# Patient Record
Sex: Female | Born: 1945
Health system: Southern US, Community
[De-identification: ages and names within clinical notes are randomized; demographics above are authoritative.]

## PROBLEM LIST (undated history)

## (undated) DIAGNOSIS — I10 Essential (primary) hypertension: Secondary | ICD-10-CM

## (undated) DIAGNOSIS — Z86010 Personal history of colonic polyps: Secondary | ICD-10-CM

## (undated) DIAGNOSIS — Z85828 Personal history of other malignant neoplasm of skin: Secondary | ICD-10-CM

## (undated) DIAGNOSIS — N6009 Solitary cyst of unspecified breast: Secondary | ICD-10-CM

## (undated) DIAGNOSIS — G4700 Insomnia, unspecified: Secondary | ICD-10-CM

## (undated) DIAGNOSIS — M1990 Unspecified osteoarthritis, unspecified site: Secondary | ICD-10-CM

## (undated) DIAGNOSIS — M25569 Pain in unspecified knee: Secondary | ICD-10-CM

## (undated) DIAGNOSIS — K649 Unspecified hemorrhoids: Secondary | ICD-10-CM

## (undated) DIAGNOSIS — M76899 Other specified enthesopathies of unspecified lower limb, excluding foot: Secondary | ICD-10-CM

## (undated) DIAGNOSIS — K219 Gastro-esophageal reflux disease without esophagitis: Secondary | ICD-10-CM

## (undated) DIAGNOSIS — E039 Hypothyroidism, unspecified: Secondary | ICD-10-CM

## (undated) HISTORY — DX: Personal history of other malignant neoplasm of skin: Z85.828

## (undated) HISTORY — DX: Solitary cyst of unspecified breast: N60.09

## (undated) HISTORY — PX: BASAL CELL CARCINOMA EXCISION: SHX1214

## (undated) HISTORY — DX: Essential (primary) hypertension: I10

## (undated) HISTORY — PX: HYSTEROSCOPY: SHX211

## (undated) HISTORY — DX: Personal history of colonic polyps: Z86.010

## (undated) HISTORY — DX: Unspecified hemorrhoids: K64.9

## (undated) HISTORY — PX: POLYPECTOMY: SHX149

## (undated) HISTORY — PX: COLONOSCOPY: SHX174

## (undated) HISTORY — DX: Other specified enthesopathies of unspecified lower limb, excluding foot: M76.899

## (undated) HISTORY — DX: Insomnia, unspecified: G47.00

## (undated) HISTORY — PX: TONSILLECTOMY AND ADENOIDECTOMY: SUR1326

## (undated) HISTORY — DX: Pain in unspecified knee: M25.569

---
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
## 1988-09-19 HISTORY — PX: OTHER SURGICAL HISTORY: SHX169

## 1998-02-02 ENCOUNTER — Other Ambulatory Visit: Admission: RE | Admit: 1998-02-02 | Discharge: 1998-02-02 | Payer: Self-pay | Admitting: Gynecology

## 1999-05-11 ENCOUNTER — Other Ambulatory Visit: Admission: RE | Admit: 1999-05-11 | Discharge: 1999-05-11 | Payer: Self-pay | Admitting: Gynecology

## 2000-02-02 ENCOUNTER — Other Ambulatory Visit: Admission: RE | Admit: 2000-02-02 | Discharge: 2000-02-02 | Payer: Self-pay | Admitting: Gynecology

## 2000-09-14 ENCOUNTER — Other Ambulatory Visit: Admission: RE | Admit: 2000-09-14 | Discharge: 2000-09-14 | Payer: Self-pay | Admitting: Gynecology

## 2000-09-15 ENCOUNTER — Other Ambulatory Visit: Admission: RE | Admit: 2000-09-15 | Discharge: 2000-09-15 | Payer: Self-pay | Admitting: Gynecology

## 2000-09-15 ENCOUNTER — Encounter (INDEPENDENT_AMBULATORY_CARE_PROVIDER_SITE_OTHER): Payer: Self-pay

## 2000-10-13 ENCOUNTER — Other Ambulatory Visit: Admission: RE | Admit: 2000-10-13 | Discharge: 2000-10-13 | Payer: Self-pay | Admitting: Surgery

## 2001-09-18 ENCOUNTER — Other Ambulatory Visit: Admission: RE | Admit: 2001-09-18 | Discharge: 2001-09-18 | Payer: Self-pay | Admitting: Gynecology

## 2002-11-18 ENCOUNTER — Other Ambulatory Visit: Admission: RE | Admit: 2002-11-18 | Discharge: 2002-11-18 | Payer: Self-pay | Admitting: Gynecology

## 2002-12-12 ENCOUNTER — Other Ambulatory Visit: Admission: RE | Admit: 2002-12-12 | Discharge: 2002-12-12 | Payer: Self-pay | Admitting: Surgery

## 2003-12-30 ENCOUNTER — Other Ambulatory Visit: Admission: RE | Admit: 2003-12-30 | Discharge: 2003-12-30 | Payer: Self-pay | Admitting: Gynecology

## 2004-12-30 ENCOUNTER — Other Ambulatory Visit: Admission: RE | Admit: 2004-12-30 | Discharge: 2004-12-30 | Payer: Self-pay | Admitting: Gynecology

## 2006-01-02 ENCOUNTER — Other Ambulatory Visit: Admission: RE | Admit: 2006-01-02 | Discharge: 2006-01-02 | Payer: Self-pay | Admitting: Gynecology

## 2007-03-12 ENCOUNTER — Other Ambulatory Visit: Admission: RE | Admit: 2007-03-12 | Discharge: 2007-03-12 | Payer: Self-pay | Admitting: Gynecology

## 2007-10-19 ENCOUNTER — Encounter: Payer: Self-pay | Admitting: Internal Medicine

## 2007-11-13 ENCOUNTER — Ambulatory Visit: Payer: Self-pay | Admitting: Gastroenterology

## 2007-11-18 DIAGNOSIS — Z860100 Personal history of colon polyps, unspecified: Secondary | ICD-10-CM

## 2007-11-18 HISTORY — PX: OTHER SURGICAL HISTORY: SHX169

## 2007-11-18 HISTORY — DX: Personal history of colonic polyps: Z86.010

## 2007-11-18 HISTORY — DX: Personal history of colon polyps, unspecified: Z86.0100

## 2007-12-06 ENCOUNTER — Ambulatory Visit: Payer: Self-pay | Admitting: Gastroenterology

## 2007-12-06 ENCOUNTER — Encounter: Payer: Self-pay | Admitting: Gastroenterology

## 2007-12-06 LAB — HM COLONOSCOPY: HM Colonoscopy: NORMAL

## 2008-03-12 ENCOUNTER — Other Ambulatory Visit: Admission: RE | Admit: 2008-03-12 | Discharge: 2008-03-12 | Payer: Self-pay | Admitting: Gynecology

## 2008-03-12 LAB — CONVERTED CEMR LAB: Pap Smear: NORMAL

## 2008-12-12 DIAGNOSIS — Z860100 Personal history of colon polyps, unspecified: Secondary | ICD-10-CM | POA: Insufficient documentation

## 2008-12-12 DIAGNOSIS — Z85828 Personal history of other malignant neoplasm of skin: Secondary | ICD-10-CM

## 2008-12-12 DIAGNOSIS — K649 Unspecified hemorrhoids: Secondary | ICD-10-CM | POA: Insufficient documentation

## 2008-12-22 ENCOUNTER — Encounter: Payer: Self-pay | Admitting: Internal Medicine

## 2008-12-24 ENCOUNTER — Encounter: Payer: Self-pay | Admitting: Internal Medicine

## 2008-12-30 ENCOUNTER — Ambulatory Visit: Payer: Self-pay | Admitting: Internal Medicine

## 2008-12-30 DIAGNOSIS — R030 Elevated blood-pressure reading, without diagnosis of hypertension: Secondary | ICD-10-CM

## 2008-12-30 DIAGNOSIS — G4700 Insomnia, unspecified: Secondary | ICD-10-CM

## 2009-03-16 ENCOUNTER — Encounter: Payer: Self-pay | Admitting: Internal Medicine

## 2009-03-16 ENCOUNTER — Encounter: Payer: Self-pay | Admitting: Gynecology

## 2009-03-16 ENCOUNTER — Ambulatory Visit: Payer: Self-pay | Admitting: Gynecology

## 2009-03-16 ENCOUNTER — Other Ambulatory Visit: Admission: RE | Admit: 2009-03-16 | Discharge: 2009-03-16 | Payer: Self-pay | Admitting: Gynecology

## 2009-03-18 ENCOUNTER — Telehealth: Payer: Self-pay | Admitting: Internal Medicine

## 2009-04-13 ENCOUNTER — Encounter: Payer: Self-pay | Admitting: Internal Medicine

## 2009-04-13 ENCOUNTER — Ambulatory Visit: Payer: Self-pay | Admitting: Gynecology

## 2009-10-09 ENCOUNTER — Ambulatory Visit: Payer: Self-pay | Admitting: Gynecology

## 2009-10-09 ENCOUNTER — Encounter: Payer: Self-pay | Admitting: Internal Medicine

## 2009-10-19 ENCOUNTER — Ambulatory Visit: Payer: Self-pay | Admitting: Gynecology

## 2009-10-19 ENCOUNTER — Encounter: Payer: Self-pay | Admitting: Internal Medicine

## 2010-01-06 ENCOUNTER — Ambulatory Visit: Payer: Self-pay | Admitting: Internal Medicine

## 2010-01-06 DIAGNOSIS — R5381 Other malaise: Secondary | ICD-10-CM

## 2010-01-06 DIAGNOSIS — R5383 Other fatigue: Secondary | ICD-10-CM

## 2010-01-06 LAB — CONVERTED CEMR LAB
ALT: 18 units/L (ref 0–35)
Albumin: 4.2 g/dL (ref 3.5–5.2)
Basophils Relative: 0.3 % (ref 0.0–3.0)
Bilirubin, Direct: 0.1 mg/dL (ref 0.0–0.3)
CO2: 33 meq/L — ABNORMAL HIGH (ref 19–32)
Chloride: 106 meq/L (ref 96–112)
Cholesterol: 179 mg/dL (ref 0–200)
Creatinine, Ser: 0.9 mg/dL (ref 0.4–1.2)
Eosinophils Absolute: 0 10*3/uL (ref 0.0–0.7)
Eosinophils Relative: 1 % (ref 0.0–5.0)
HCT: 40.6 % (ref 36.0–46.0)
Hemoglobin: 14.1 g/dL (ref 12.0–15.0)
LDL Cholesterol: 117 mg/dL — ABNORMAL HIGH (ref 0–99)
Lymphs Abs: 1.4 10*3/uL (ref 0.7–4.0)
MCHC: 34.7 g/dL (ref 30.0–36.0)
MCV: 85.8 fL (ref 78.0–100.0)
Monocytes Absolute: 0.3 10*3/uL (ref 0.1–1.0)
Neutro Abs: 2.4 10*3/uL (ref 1.4–7.7)
Potassium: 5.3 meq/L — ABNORMAL HIGH (ref 3.5–5.1)
RBC: 4.73 M/uL (ref 3.87–5.11)
TSH: 4.18 microintl units/mL (ref 0.35–5.50)
Total Protein: 6.6 g/dL (ref 6.0–8.3)
Triglycerides: 46 mg/dL (ref 0.0–149.0)
WBC: 4.2 10*3/uL — ABNORMAL LOW (ref 4.5–10.5)

## 2010-01-07 DIAGNOSIS — M76899 Other specified enthesopathies of unspecified lower limb, excluding foot: Secondary | ICD-10-CM

## 2010-03-16 ENCOUNTER — Encounter: Payer: Self-pay | Admitting: Internal Medicine

## 2010-03-17 ENCOUNTER — Encounter: Payer: Self-pay | Admitting: Internal Medicine

## 2010-03-17 ENCOUNTER — Other Ambulatory Visit: Admission: RE | Admit: 2010-03-17 | Discharge: 2010-03-17 | Payer: Self-pay | Admitting: Gynecology

## 2010-03-17 ENCOUNTER — Ambulatory Visit: Payer: Self-pay | Admitting: Gynecology

## 2010-03-25 ENCOUNTER — Encounter: Payer: Self-pay | Admitting: Internal Medicine

## 2010-04-13 ENCOUNTER — Telehealth: Payer: Self-pay | Admitting: Internal Medicine

## 2010-04-19 ENCOUNTER — Encounter: Payer: Self-pay | Admitting: Internal Medicine

## 2010-04-27 ENCOUNTER — Encounter: Payer: Self-pay | Admitting: Internal Medicine

## 2010-04-27 LAB — HM MAMMOGRAPHY

## 2010-06-26 ENCOUNTER — Ambulatory Visit (HOSPITAL_BASED_OUTPATIENT_CLINIC_OR_DEPARTMENT_OTHER): Admission: RE | Admit: 2010-06-26 | Discharge: 2010-06-26 | Payer: Self-pay | Admitting: Orthopaedic Surgery

## 2010-06-26 ENCOUNTER — Ambulatory Visit: Payer: Self-pay | Admitting: Diagnostic Radiology

## 2010-07-06 ENCOUNTER — Encounter: Payer: Self-pay | Admitting: Internal Medicine

## 2010-10-19 NOTE — Assessment & Plan Note (Signed)
Summary: FU PER PT  D/T---STC   Vital Signs:  Patient profile:   65 year old female Menstrual status:  regular Height:      64 inches (162.56 cm) Weight:      180.12 pounds (81.87 kg) BMI:     31.03 O2 Sat:      96 % on Room air Temp:     98.3 degrees F (36.83 degrees C) oral Pulse rate:   75 / minute BP sitting:   132 / 70  (left arm) Cuff size:   regular  Vitals Entered By: Orlan Leavens (January 06, 2010 8:44 AM)  O2 Flow:  Room air CC: follow-up visit, Fatigue Is Patient Diabetic? No Pain Assessment Patient in pain? no        Primary Care Provider:  Newt Lukes MD  CC:  follow-up visit and Fatigue.  History of Present Illness: here for annual f/u visit  1) high blood pressure - relates to "white coat" syndrome.  Previously has tried Benicar in the form of samples.  She does not recall any adverse effects from this medication but reports her previous primary care doctor told her she did not need to take it. On no meds >12 months -Monitor BP at home: SBP 110-130s Monitors weight and diet carefully to control  2) insomnia, chronic She takes chronic low-dose Xanax to help sleep -needs refill exac by husband snoring (which has improved with CPAP)  3) Fatigue      This is a 65 year old woman who presents with Fatigue.  The symptoms began 2 months ago.  The severity is described as mild.  The patient reports persistent fatigue and fatigue without physical limitations, but denies fatigue with moderate exertion, primarily motivational fatigue, and primarily physical fatigue.  The patient denies fever, night sweats, weight loss, exertional chest pain, dyspnea, cough, hemoptysis, and new medications.  The patient denies the following symptoms: leg swelling, orthopnea, PND, adenopathy, severe snoring, daytime sleepiness, and skin changes.  Depressive symptoms include poor sleep.  The patient denies anhedonia, feeling depressed, and altered appetite.    4) c/o right hip  pain located on lateral side of hip, not in groin/pelvis onset >3 months - only occurs when she is lying on right side in bed at night - no injury or strain/fall, no daytime symptoms   Clinical Review Panels:  Prevention   Last Mammogram:  normal (12/22/2008)   Last Pap Smear:  normal (03/12/2008)   Last Colonoscopy:  Normal (12/06/2007)  Immunizations   Last Tetanus Booster:  Historical (03/02/2002)   Last Pneumovax:  Pneumovax (12/30/2008)   Current Medications (verified): 1)  One-A-Day Extras Antioxidant  Caps (Multiple Vitamins-Minerals) .... Take 1 By Mouth Qd 2)  Alprazolam 0.5 Mg Tabs (Alprazolam) .... Take 1 At Bedtime As Needed For Sleep 3)  Osteo Bi-Flex Joint Shield  Tabs (Misc Natural Products) .... Take 1 By Mouth Qd 4)  Bayer Low Strength 81 Mg Tbec (Aspirin) .... Take Twice A Week (Wed & Sat) 5)  Restasis 0.05 % Emul (Cyclosporine) .... Use 1 Drop Two Times A Day Q 12 Hours 6)  Calcium 1200 Mg Tabs (Calcium Carbonate) .... Take 1 Two Times A Day  Allergies (verified): No Known Drug Allergies  Past History:  Past Medical History: Colonic polyps, hx of Skin cancer, hx of (forehead) Hypertension, diet controlled  MD rooster: gyn - [facility]  Past Surgical History: Reviewed history from 12/30/2008 and no changes required. Colon polypectomy - stark 3/09 Lumpectomy [phone]  Tonsillectomy (1957)  Family History: Reviewed history from 12/30/2008 and no changes required. Family History of Arthritis (father0 Family History Breast cancer 1st degree relative <50 (aunt on father side) Family History of CAD Female 1st degree relative <50 (father) Family History Diabetes 1st degree relative (grandmother on dad side) Family History Hypertension (father)  Social History: Reviewed history from 12/30/2008 and no changes required. Marital Status: Married, lives with spouse Occupation retired Associate Professor - subs now [employer] Never Smoked, no alcohol  Review of Systems        see HPI above. I have reviewed all other systems and they were negative.   Physical Exam  General:  alert, well-developed, well-nourished, and cooperative to examination.    Lungs:  normal respiratory effort, no intercostal retractions or use of accessory muscles; normal breath sounds bilaterally - no crackles and no wheezes.    Heart:  normal rate, regular rhythm, no murmur, and no rub. BLE without edema. Abdomen:  soft, non-tender, normal bowel sounds, no distention; no masses and no appreciable hepatomegaly or splenomegaly.   Msk:  right hip: full range of motion. Negative pain with logroll. No deep groin pain. +tender to palpation over greater trochanter.   Neurologic:  alert & oriented X3 and cranial nerves II-XII symetrically intact.  strength normal in all extremities, sensation intact to light touch, and gait normal. speech fluent without dysarthria or aphasia; follows commands with good comprehension.  Psych:  Oriented X3, memory intact for recent and remote, normally interactive, good eye contact, not anxious appearing, not depressed appearing, and not agitated.      Impression & Recommendations:  Problem # 1:  FATIGUE (ICD-780.79) nonsp hx, benign exam - check labs; reassurance provided Orders: TLB-BMP (Basic Metabolic Panel-BMET) (80048-METABOL) TLB-CBC Platelet - w/Differential (85025-CBCD) TLB-Hepatic/Liver Function Pnl (80076-HEPATIC) TLB-TSH (Thyroid Stimulating Hormone) (84443-TSH)  Problem # 2:  INSOMNIA (ICD-780.52)  takes low dose chronic Xanax - ok to cont same refills done today  Problem # 3:  TROCHANTERIC BURSITIS, RIGHT (ICD-726.5) given information on dx and stretches to help with same  Problem # 4:  HYPERTENSION (ICD-401.9)  not currently on treatment.  rec cont  diet and exercise to control reck annually, sooner if needed, and start tx if above goal (SBP>130s)  BP today: 132/70 Prior BP: 142/82 (12/30/2008)  Complete Medication List: 1)  One-a-day  Extras Antioxidant Caps (Multiple vitamins-minerals) .... Take 1 by mouth qd 2)  Alprazolam 0.5 Mg Tabs (Alprazolam) .... Take 1 at bedtime as needed for sleep 3)  Osteo Bi-flex Joint Shield Tabs (Misc natural products) .... Take 1 by mouth qd 4)  Bayer Low Strength 81 Mg Tbec (Aspirin) .... Take twice a week (wed & sat) 5)  Restasis 0.05 % Emul (Cyclosporine) .... Use 1 drop two times a day q 12 hours 6)  Calcium 1200 Mg Tabs (calcium Carbonate)  .... Take 1 two times a day  Other Orders: TLB-Lipid Panel (80061-LIPID)  Patient Instructions: 1)  it was good to see you today.  2)  refill on xanax to take as needed  3)  test(s) ordered today - your results will be posted on the phone tree for review in 48-72 hours from the time of test completion; call [phone] and enter your 9 digit MRN (listed above on this page, just below your name); if any changes need to be made or there are abnormal results, you will be contacted directly.  4)  please ask dr. Audie Box to forward results of your bone density at  your next visit there 5)  stretches for "greater trochanteric bursitis" as demonstrated to help with right hip pain - ok to use alleve as you are doing 6)  Please schedule a follow-up appointment annually, sooner if problems.  Prescriptions: ALPRAZOLAM 0.5 MG TABS (ALPRAZOLAM) take 1 at bedtime as needed for sleep  #30 x 1   Entered by:   Orlan Leavens   Authorized by:   Newt Lukes MD   Signed by:   Orlan Leavens on 01/06/2010   Method used:   Print then Give to Patient   RxID:   [id number]

## 2010-10-19 NOTE — Letter (Signed)
Summary: [facility]   Imported By: Sherian Rein 03/29/2010 13:19:20  _____________________________________________________________________  External Attachment:    Type:   Image     Comment:   External Document

## 2010-10-19 NOTE — Progress Notes (Signed)
Summary: Rx refill  Prescriptions: ALPRAZOLAM 0.5 MG TABS (ALPRAZOLAM) take 1 at bedtime as needed for sleep  #30 x 1   Entered and Authorized by:   Margaret Pyle, CMA   Signed by:   Margaret Pyle, CMA on 04/13/2010   Method used:   Telephoned to ...       [facility]  [address] [phone]* (retail)       [address]       [place], [place]  [address]       Ph: [phone]       Fax: [phone]   RxID:   [id number]

## 2010-10-19 NOTE — Miscellaneous (Signed)
Summary: Flu Vaccine/[facility]  Flu Vaccine/[facility]   Imported By: Esmeralda Links D'jimraou 07/10/2010 10:45:45  _____________________________________________________________________  External Attachment:    Type:   Image     Comment:   External Document

## 2010-10-19 NOTE — Letter (Signed)
Summary: [facility]   Imported By: Sherian Rein 03/29/2010 13:24:30  _____________________________________________________________________  External Attachment:    Type:   Image     Comment:   External Document

## 2010-10-19 NOTE — Letter (Signed)
Summary: [facility]   Imported By: Sherian Rein 03/29/2010 13:23:13  _____________________________________________________________________  External Attachment:    Type:   Image     Comment:   External Document

## 2010-10-19 NOTE — Miscellaneous (Signed)
Summary: Flu Vaccination/[facility]  Flu Vaccination/[facility]   Imported By: Sherian Rein 03/29/2010 13:26:45  _____________________________________________________________________  External Attachment:    Type:   Image     Comment:   External Document

## 2010-11-01 ENCOUNTER — Encounter: Payer: Self-pay | Admitting: Internal Medicine

## 2010-12-27 ENCOUNTER — Other Ambulatory Visit: Payer: Self-pay | Admitting: Internal Medicine

## 2010-12-27 NOTE — Telephone Encounter (Signed)
Faxed script back to [facility] [address]

## 2011-02-15 ENCOUNTER — Other Ambulatory Visit: Payer: Self-pay | Admitting: Internal Medicine

## 2011-02-16 NOTE — Telephone Encounter (Signed)
Faxed script back to [facility] @ [phone].Marland KitchenMarland Kitchen5/30/12@9 :35am/LMB

## 2011-03-15 ENCOUNTER — Encounter: Payer: Self-pay | Admitting: Internal Medicine

## 2011-03-21 ENCOUNTER — Ambulatory Visit (INDEPENDENT_AMBULATORY_CARE_PROVIDER_SITE_OTHER): Payer: Medicare Other | Admitting: Internal Medicine

## 2011-03-21 ENCOUNTER — Telehealth: Payer: Self-pay

## 2011-03-21 ENCOUNTER — Encounter: Payer: Self-pay | Admitting: Internal Medicine

## 2011-03-21 ENCOUNTER — Other Ambulatory Visit (INDEPENDENT_AMBULATORY_CARE_PROVIDER_SITE_OTHER): Payer: Medicare Other

## 2011-03-21 ENCOUNTER — Other Ambulatory Visit: Payer: Self-pay | Admitting: Internal Medicine

## 2011-03-21 VITALS — BP 128/82 | HR 65 | Temp 98.2°F | Ht 64.0 in | Wt 177.8 lb

## 2011-03-21 DIAGNOSIS — R5383 Other fatigue: Secondary | ICD-10-CM

## 2011-03-21 DIAGNOSIS — R300 Dysuria: Secondary | ICD-10-CM

## 2011-03-21 DIAGNOSIS — I10 Essential (primary) hypertension: Secondary | ICD-10-CM

## 2011-03-21 DIAGNOSIS — E039 Hypothyroidism, unspecified: Secondary | ICD-10-CM

## 2011-03-21 DIAGNOSIS — Z0000 Encounter for general adult medical examination without abnormal findings: Secondary | ICD-10-CM

## 2011-03-21 DIAGNOSIS — R5381 Other malaise: Secondary | ICD-10-CM

## 2011-03-21 DIAGNOSIS — Z136 Encounter for screening for cardiovascular disorders: Secondary | ICD-10-CM

## 2011-03-21 DIAGNOSIS — Z23 Encounter for immunization: Secondary | ICD-10-CM

## 2011-03-21 LAB — CBC WITH DIFFERENTIAL/PLATELET
Basophils Absolute: 0 10*3/uL (ref 0.0–0.1)
Eosinophils Absolute: 0 10*3/uL (ref 0.0–0.7)
Lymphocytes Relative: 32.3 % (ref 12.0–46.0)
MCHC: 34.8 g/dL (ref 30.0–36.0)
Neutrophils Relative %: 57.3 % (ref 43.0–77.0)
RBC: 4.7 Mil/uL (ref 3.87–5.11)
RDW: 13.1 % (ref 11.5–14.6)

## 2011-03-21 LAB — LIPID PANEL
Cholesterol: 176 mg/dL (ref 0–200)
HDL: 59.6 mg/dL (ref >39.00)

## 2011-03-21 LAB — HEPATIC FUNCTION PANEL
ALT: 23 U/L (ref 0–35)
Albumin: 4.6 g/dL (ref 3.5–5.2)
Alkaline Phosphatase: 58 U/L (ref 39–117)
Bilirubin, Direct: 0.1 mg/dL (ref 0.0–0.3)
Total Protein: 7.1 g/dL (ref 6.0–8.3)

## 2011-03-21 LAB — URINALYSIS
Leukocytes, UA: NEGATIVE
Nitrite: NEGATIVE
Specific Gravity, Urine: 1.02 (ref 1.000–1.030)
Urobilinogen, UA: 0.2 (ref 0.0–1.0)

## 2011-03-21 LAB — BASIC METABOLIC PANEL
CO2: 31 mEq/L (ref 19–32)
Chloride: 106 mEq/L (ref 96–112)
Creatinine, Ser: 0.9 mg/dL (ref 0.4–1.2)
Glucose, Bld: 94 mg/dL (ref 70–99)

## 2011-03-21 LAB — TSH: TSH: 5.68 u[IU]/mL — ABNORMAL HIGH (ref 0.35–5.50)

## 2011-03-21 MED ORDER — LEVOTHYROXINE SODIUM 25 MCG PO TABS: 25.0000 ug | ORAL_TABLET | Freq: Every day | ORAL | Status: DC

## 2011-03-21 NOTE — Telephone Encounter (Signed)
Noted thanks °

## 2011-03-21 NOTE — Telephone Encounter (Signed)
Pt called to advise MD on Zoster immunization (10/25/2007). Immunization History updated.

## 2011-03-21 NOTE — Assessment & Plan Note (Signed)
Nonspecific history and exam - check screening labs

## 2011-03-21 NOTE — Progress Notes (Signed)
Subjective:    Patient ID: CODA FILLER, female    DOB: December 11, 1945, 65 y.o.   MRN: [id number]  HPI  Here for medicare wellness/"physical"  Diet: heart healthy Physical activity: active Depression/mood screen: negative Hearing: intact to whispered voice Visual acuity: grossly normal, performs annual eye exam  ADLs: capable Fall risk: none Home safety: good Cognitive evaluation: intact to orientation, naming, recall and repetition EOL planning: adv directives, full code/ I agree  I have personally reviewed and have noted 1. The patient's medical and social history 2. Their use of alcohol, tobacco or illicit drugs 3. Their current medications and supplements 4. The patient's functional ability including ADL's, fall risks, home safety risks and hearing or visual impairment. 5. Diet and physical activities 6. Evidence for depression or mood disorders   Past Medical History  Diagnosis Date  . COLONIC POLYPS, HX OF 11/2007  . SKIN CANCER, HX OF   . INSOMNIA   . HEMORRHOIDS   . TROCHANTERIC BURSITIS, RIGHT   . HYPERTENSION    Family History  Problem Relation Age of Onset  . Arthritis Father   . Coronary artery disease Father   . Hypertension Father   . Breast cancer Paternal Aunt   . Diabetes Paternal Grandmother    History  Substance Use Topics  . Smoking status: Never Smoker   . Smokeless tobacco: Not on file   Comment: Married, lives with spouse. Retired asst. teacher-subs now [employer]  . Alcohol Use:      Review of Systems Constitutional: Positive for fatigue; Negative for fever or unexpected weight change.  Respiratory: Negative for cough and shortness of breath.   Cardiovascular: Negative for chest pain.  Gastrointestinal: Negative for abdominal pain.  Musculoskeletal: Negative for gait problem.  Skin: Negative for rash.  Neurological: Negative for dizziness.  No other specific complaints in a complete review of systems (except as listed in HPI above).       Objective:   Physical Exam BP 128/82  Pulse 65  Temp(Src) 98.2 F (36.8 C) (Oral)  Ht 5\' 4"  (1.626 m)  Wt 177 lb 12.8 oz (80.65 kg)  BMI 30.52 kg/m2  SpO2 98% Wt Readings from Last 3 Encounters:  03/21/11 177 lb 12.8 oz (80.65 kg)  01/06/10 180 lb 1.9 oz (81.701 kg)  12/30/08 186 lb (84.369 kg)   Physical Exam  Constitutional: She is oriented to person, place, and time. She appears well-developed and well-nourished. No distress.  HENT: Head: Normocephalic and atraumatic. Ears; B TMs ok, no erythema or effusion; Nose: Nose normal.  Mouth/Throat: Oropharynx is clear and moist. No oropharyngeal exudate.  Eyes: Conjunctivae and EOM are normal. Pupils are equal, round, and reactive to light. No scleral icterus.  Neck: Normal range of motion. Neck supple. No JVD present. No thyromegaly present.  Cardiovascular: Normal rate, regular rhythm and normal heart sounds.  No murmur heard. No BLE edema. Pulmonary/Chest: Effort normal and breath sounds normal. No respiratory distress. She has no wheezes.  Abdominal: Soft. Bowel sounds are normal. She exhibits no distension. There is no tenderness.  Musculoskeletal: Normal range of motion, no joint effusions. No gross deformities Neurological: She is alert and oriented to person, place, and time. No cranial nerve deficit. Coordination normal.  Skin: Skin is warm and dry. No rash noted. No erythema.  Psychiatric: She has a normal mood and affect. Her behavior is normal. Judgment and thought content normal.   Lab Results  Component Value Date   WBC 4.2* 01/06/2010   HGB  14.1 01/06/2010   HCT 40.6 01/06/2010   PLT 286.0 01/06/2010   CHOL 179 01/06/2010   TRIG 46.0 01/06/2010   HDL 52.60 01/06/2010   ALT 18 01/06/2010   AST 20 01/06/2010   NA 146* 01/06/2010   K 5.3* 01/06/2010   CL 106 01/06/2010   CREATININE 0.9 01/06/2010   BUN 19 01/06/2010   CO2 33* 01/06/2010   TSH 4.18 01/06/2010       EKG:NSR @ 68; no arrythmia or ischemic  changes   Assessment & Plan:  AWV - v70.0 CPX - Today patient counseled on age appropriate routine health concerns for screening and prevention, each reviewed and up to date or declined. Immunizations reviewed and up to date or declined. Labs/ECG reviewed. Risk factors for depression reviewed and negative. Hearing function and visual acuity are intact. ADLs screened and addressed as needed. Functional ability and level of safety reviewed and appropriate. Education, counseling and referrals performed based on assessed risks today. Patient provided with a copy of personalized plan for preventive services.  Also see problem list. Medications and labs reviewed today.

## 2011-03-21 NOTE — Patient Instructions (Signed)
It was good to see you today. We have reviewed your prior records including labs and tests today - let us know about the shingles vaccine Test(s) ordered today. Your results will be called to you after review (48-72hours after test completion). If any changes need to be made, you will be notified at that time. Medications reviewed, no changes at this time. Please schedule followup in 1 year, call sooner if problems.

## 2011-03-21 NOTE — Assessment & Plan Note (Signed)
Borderline but never on medications for same - to continue diet and exercise for control of same -  Check screening lipids for risk factors

## 2011-03-24 ENCOUNTER — Encounter: Payer: Self-pay | Admitting: Internal Medicine

## 2011-04-11 ENCOUNTER — Other Ambulatory Visit: Payer: Self-pay | Admitting: Internal Medicine

## 2011-04-12 NOTE — Telephone Encounter (Signed)
Faxed script back to [facility] @ [phone]...7//@:am/LMB

## 2011-05-24 ENCOUNTER — Other Ambulatory Visit: Payer: Self-pay | Admitting: Internal Medicine

## 2011-05-25 ENCOUNTER — Other Ambulatory Visit: Payer: Self-pay | Admitting: *Deleted

## 2011-05-25 NOTE — Telephone Encounter (Signed)
Faxed script back to [facility] @ [phone]...05/25/11@1 :20pm/LMB

## 2011-05-26 MED ORDER — ALPRAZOLAM 0.5 MG PO TABS: 0.5000 mg | ORAL_TABLET | Freq: Every evening | ORAL | Status: DC | PRN

## 2011-05-26 NOTE — Telephone Encounter (Signed)
Faxed script back to [facility] @ [phone]...05/26/11@1 :34pm/LMB

## 2011-07-01 ENCOUNTER — Other Ambulatory Visit: Payer: Self-pay | Admitting: Internal Medicine

## 2011-07-01 NOTE — Telephone Encounter (Signed)
Please advise in Dr. Diamantina Monks absence-last written 05/25/2011 #30 with 0 refills

## 2011-07-04 NOTE — Telephone Encounter (Signed)
Rx called into [facility]

## 2011-07-11 ENCOUNTER — Telehealth: Payer: Self-pay | Admitting: *Deleted

## 2011-07-11 MED ORDER — AMITRIPTYLINE HCL 10 MG PO TABS: 10.0000 mg | ORAL_TABLET | Freq: Every day | ORAL | Status: DC

## 2011-07-11 NOTE — Telephone Encounter (Signed)
Husband came in for appt to see md. Wife sent msg stating sleeping med md rx is not helping. At her last visit md recommend amitriptyline at that time she did not want to try. Now pt is wanting to try. Pls advise...07/10/12@3 :58pm/LMB

## 2011-07-11 NOTE — Telephone Encounter (Signed)
erx for same done -

## 2011-09-05 ENCOUNTER — Telehealth: Payer: Self-pay | Admitting: *Deleted

## 2011-09-05 NOTE — Telephone Encounter (Signed)
Pt left msg on vm Friday 09/02/11 wanting to does she need ov md had put her on levothyroxine and was told to f/u in 6 months. I was not her on Friday so im just now getting msg. Called pt back no answer left msg on vm per last ov yes need to make f/u appt & due to insurance md will send her down for labs on that day...09/05/11@11 :48am/LMB

## 2011-09-16 ENCOUNTER — Other Ambulatory Visit (INDEPENDENT_AMBULATORY_CARE_PROVIDER_SITE_OTHER): Payer: Medicare Other

## 2011-09-16 ENCOUNTER — Encounter: Payer: Self-pay | Admitting: Internal Medicine

## 2011-09-16 ENCOUNTER — Ambulatory Visit (INDEPENDENT_AMBULATORY_CARE_PROVIDER_SITE_OTHER): Payer: Medicare Other | Admitting: Internal Medicine

## 2011-09-16 VITALS — BP 122/62 | HR 66 | Temp 98.0°F | Wt 178.4 lb

## 2011-09-16 DIAGNOSIS — E039 Hypothyroidism, unspecified: Secondary | ICD-10-CM

## 2011-09-16 DIAGNOSIS — I10 Essential (primary) hypertension: Secondary | ICD-10-CM

## 2011-09-16 NOTE — Progress Notes (Signed)
  Subjective:    Patient ID: Rebekah Valentine, female    DOB: 1945/10/27, 65 y.o.   MRN: [id number]  HPI   Here for follow up    Past Medical History  Diagnosis Date  . COLONIC POLYPS, HX OF 11/2007  . SKIN CANCER, HX OF   . INSOMNIA   . HEMORRHOIDS   . TROCHANTERIC BURSITIS, RIGHT   . HYPERTENSION      Review of Systems  Constitutional: Positive for fatigue; Negative for fever or unexpected weight change.  Respiratory: Negative for cough and shortness of breath.   Cardiovascular: Negative for chest pain.       Objective:   Physical Exam  BP 122/62  Pulse 66  Temp(Src) 98 F (36.7 C) (Oral)  Wt 178 lb 6.4 oz (80.922 kg)  SpO2 97% Wt Readings from Last 3 Encounters:  09/16/11 178 lb 6.4 oz (80.922 kg)  03/21/11 177 lb 12.8 oz (80.65 kg)  01/06/10 180 lb 1.9 oz (81.701 kg)    Constitutional: She appears well-developed and well-nourished. No distress.  Neck: Normal range of motion. Neck supple. No JVD present. No thyromegaly present.  Cardiovascular: Normal rate, regular rhythm and normal heart sounds.  No murmur heard. No BLE edema. Pulmonary/Chest: Effort normal and breath sounds normal. No respiratory distress. She has no wheezes.  Psychiatric: She has a normal mood and affect. Her behavior is normal. Judgment and thought content normal.   Lab Results  Component Value Date   WBC 4.3* 03/21/2011   HGB 14.2 03/21/2011   HCT 40.8 03/21/2011   PLT 280.0 03/21/2011   CHOL 176 03/21/2011   TRIG 66.0 03/21/2011   HDL 59.60 03/21/2011   ALT 23 03/21/2011   AST 20 03/21/2011   NA 141 03/21/2011   K 4.9 03/21/2011   CL 106 03/21/2011   CREATININE 0.9 03/21/2011   BUN 19 03/21/2011   CO2 31 03/21/2011   TSH 5.68* 03/21/2011        Assessment & Plan:  See problem list. Medications and labs reviewed today.

## 2011-09-16 NOTE — Patient Instructions (Signed)
It was good to see you today. Test(s) ordered today. Your results will be called to you after review (48-72hours after test completion). If any changes need to be made, you will be notified at that time. Please schedule followup in 6 months for physical, call sooner if problems.  Lab Results  Component Value Date   TSH 5.68* 03/21/2011

## 2011-09-16 NOTE — Assessment & Plan Note (Signed)
Borderline but never on medications for same - to continue diet and exercise for control of same -  Check screening lipids for risk factors BP Readings from Last 3 Encounters:  09/16/11 122/62  03/21/11 128/82  01/06/10 132/70

## 2011-09-16 NOTE — Assessment & Plan Note (Signed)
Started low dose meds for same 03/2011 Recheck now and adjust as needed Lab Results  Component Value Date   TSH 5.68* 03/21/2011

## 2011-10-14 ENCOUNTER — Other Ambulatory Visit: Payer: Self-pay | Admitting: Internal Medicine

## 2011-10-26 ENCOUNTER — Other Ambulatory Visit: Payer: Self-pay | Admitting: Internal Medicine

## 2012-03-19 ENCOUNTER — Encounter: Payer: Self-pay | Admitting: *Deleted

## 2012-03-23 ENCOUNTER — Encounter: Payer: Self-pay | Admitting: Gynecology

## 2012-03-26 ENCOUNTER — Encounter: Payer: Self-pay | Admitting: Gynecology

## 2012-03-26 ENCOUNTER — Ambulatory Visit (INDEPENDENT_AMBULATORY_CARE_PROVIDER_SITE_OTHER): Payer: Medicare Other | Admitting: Gynecology

## 2012-03-26 VITALS — BP 136/86 | Ht 64.0 in | Wt 187.0 lb

## 2012-03-26 DIAGNOSIS — N952 Postmenopausal atrophic vaginitis: Secondary | ICD-10-CM

## 2012-03-26 DIAGNOSIS — R21 Rash and other nonspecific skin eruption: Secondary | ICD-10-CM

## 2012-03-26 MED ORDER — FLUCONAZOLE 200 MG PO TABS: 200.0000 mg | ORAL_TABLET | Freq: Every day | ORAL | Status: AC

## 2012-03-26 MED ORDER — NYSTATIN-TRIAMCINOLONE 100000-0.1 UNIT/GM-% EX OINT: TOPICAL_OINTMENT | Freq: Two times a day (BID) | CUTANEOUS | Status: DC

## 2012-03-26 NOTE — Progress Notes (Signed)
Rebekah Valentine [birth year] [id number]        66 y.o.  for annual follow up.  Several issues noted below.  Past medical history,surgical history, medications, allergies, family history and social history were all reviewed and documented in the EPIC chart. ROS:  Was performed and pertinent positives and negatives are included in the history.  Exam: Rebekah Valentine assistant Filed Vitals:   03/26/12 1140  BP: 136/86   General appearance  Normal Skin grossly normal Head/Neck normal with no cervical or supraclavicular adenopathy thyroid normal Lungs  clear Cardiac RR, without RMG Abdominal  soft, nontender, without masses, organomegaly or hernia Breasts  examined lying and sitting without masses, retractions, discharge or axillary adenopathy. Pelvic  Ext/BUS/vagina  Bilateral inflammatory rash creases of her thigh/upper mons. General atrophic changes noted.  Cervix  normal   Uterus  anteverted, normal size, shape and contour, midline and mobile nontender   Adnexa  Without masses or tenderness    Anus and perineum  normal   Rectovaginal  normal sphincter tone without palpated masses or tenderness.    Assessment/Plan:  66 y.o. female for annual follow up.    1. Vulvar rash. Classic for Candida. We'll treat with Diflucan 200 daily x7 days and Mytrex cream twice a day as needed. Follow up if symptoms persist or recur. 2. Atrophic changes/postmenopausal. Some hot flashes. Overall doing well off HRT. We'll continue to monitor. 3. Bone health. DEXA 03/2009 normal. We'll repeat in 2015. 4. Pap smear. No history of significant abnormal Pap smears. Numerous normal reports in her chart with last Pap smear 2011. No Pap smear done today.  I reviewed current screening guidelines and options to stop altogether as she is over age 56 reviewed versus less frequent screening discussed.  Will rediscuss on an annual basis. 5. Mammography.  Patient had mammogram 03/19/2012. We'll continue with annual mammography. SBE  monthly reviewed. 6. Colonoscopy. Patients coming due this year she knows to schedule this at a five-year interval. 7. Health maintenance. No lab work was done today as a result done through her primary physician's office. Assuming she continues well and her rash resolves she will see me in a year, sooner as needed.    Rebekah Lords MD, 12:22 PM 03/26/2012

## 2012-03-26 NOTE — Patient Instructions (Signed)
Take antibiotics daily for 7 days. Use cream externally twice daily as needed. Follow up if symptoms persist or recur. Otherwise follow up in one year for annual exam.

## 2012-03-30 ENCOUNTER — Encounter: Payer: Self-pay | Admitting: Gynecology

## 2012-04-08 ENCOUNTER — Other Ambulatory Visit: Payer: Self-pay | Admitting: Internal Medicine

## 2012-04-09 ENCOUNTER — Other Ambulatory Visit: Payer: Self-pay | Admitting: Internal Medicine

## 2012-07-16 ENCOUNTER — Encounter: Payer: Self-pay | Admitting: Internal Medicine

## 2012-07-18 ENCOUNTER — Telehealth: Payer: Self-pay | Admitting: *Deleted

## 2012-07-18 MED ORDER — NYSTATIN-TRIAMCINOLONE 100000-0.1 UNIT/GM-% EX OINT: TOPICAL_OINTMENT | Freq: Two times a day (BID) | CUTANEOUS | Status: DC

## 2012-07-18 NOTE — Telephone Encounter (Signed)
Pt is wanting to know can she rx Nystatin/triamcinolone ointment. Was originally rx by her gynecologist. [facility]...Raechel Chute

## 2012-07-18 NOTE — Telephone Encounter (Signed)
Per md ok to send refill. Inform husband rx was sent...Raechel Chute

## 2012-09-09 ENCOUNTER — Other Ambulatory Visit: Payer: Self-pay | Admitting: Internal Medicine

## 2012-10-08 ENCOUNTER — Ambulatory Visit (INDEPENDENT_AMBULATORY_CARE_PROVIDER_SITE_OTHER): Payer: Medicare PPO | Admitting: Internal Medicine

## 2012-10-08 ENCOUNTER — Other Ambulatory Visit (INDEPENDENT_AMBULATORY_CARE_PROVIDER_SITE_OTHER): Payer: Medicare PPO

## 2012-10-08 ENCOUNTER — Encounter: Payer: Self-pay | Admitting: Internal Medicine

## 2012-10-08 VITALS — BP 162/96 | HR 85 | Temp 98.0°F | Resp 16 | Wt 184.0 lb

## 2012-10-08 DIAGNOSIS — Z0000 Encounter for general adult medical examination without abnormal findings: Secondary | ICD-10-CM

## 2012-10-08 DIAGNOSIS — R5383 Other fatigue: Secondary | ICD-10-CM

## 2012-10-08 DIAGNOSIS — R5381 Other malaise: Secondary | ICD-10-CM

## 2012-10-08 DIAGNOSIS — E039 Hypothyroidism, unspecified: Secondary | ICD-10-CM

## 2012-10-08 DIAGNOSIS — R030 Elevated blood-pressure reading, without diagnosis of hypertension: Secondary | ICD-10-CM

## 2012-10-08 LAB — HEPATIC FUNCTION PANEL
ALT: 22 U/L (ref 0–35)
AST: 22 U/L (ref 0–37)
Albumin: 4.4 g/dL (ref 3.5–5.2)
Total Protein: 7.4 g/dL (ref 6.0–8.3)

## 2012-10-08 LAB — BASIC METABOLIC PANEL
Calcium: 9.4 mg/dL (ref 8.4–10.5)
GFR: 68.82 mL/min (ref >60.00)
Glucose, Bld: 100 mg/dL — ABNORMAL HIGH (ref 70–99)
Sodium: 139 mEq/L (ref 135–145)

## 2012-10-08 LAB — CBC WITH DIFFERENTIAL/PLATELET
Basophils Absolute: 0 10*3/uL (ref 0.0–0.1)
Hemoglobin: 14.8 g/dL (ref 12.0–15.0)
Lymphocytes Relative: 33.2 % (ref 12.0–46.0)
Monocytes Relative: 8.7 % (ref 3.0–12.0)
Neutro Abs: 3 10*3/uL (ref 1.4–7.7)
RBC: 5.14 Mil/uL — ABNORMAL HIGH (ref 3.87–5.11)
RDW: 13.2 % (ref 11.5–14.6)

## 2012-10-08 MED ORDER — AMITRIPTYLINE HCL 10 MG PO TABS: 10.0000 mg | ORAL_TABLET | Freq: Every evening | ORAL | Status: DC | PRN

## 2012-10-08 MED ORDER — LEVOTHYROXINE SODIUM 25 MCG PO TABS: 25.0000 ug | ORAL_TABLET | Freq: Every day | ORAL | Status: DC

## 2012-10-08 NOTE — Assessment & Plan Note (Signed)
Started low dose meds for same 03/2011 Recheck now and adjust as needed Lab Results  Component Value Date   TSH 2.74 09/16/2011   

## 2012-10-08 NOTE — Assessment & Plan Note (Signed)
Elevated today, but never on medications for same - to continue diet and exercise for control of same -  Recheck in 2 weeks with nurse visit and compare home log  BP Readings from Last 3 Encounters:  10/08/12 162/96  03/26/12 136/86  09/16/11 122/62

## 2012-10-08 NOTE — Patient Instructions (Signed)
It was good to see you today. Test(s) ordered today. Your results will be released to MyChart (or called to you) after review, usually within 72hours after test completion. If any changes need to be made, you will be notified at that same time. Health Maintenance reviewed - all recommended immunizations and age-appropriate screenings are up-to-date. Medications reviewed and updated - increase amitriptyline as needed for sleep, no other changes at this time. Refill on medication(s) as discussed today. Return in 2 weeks for nurse visit to recheck blood pressure- bring your home blood pressure log for review and home cuff - if running over 140/90, will consider other medication  Health Maintenance, Females A healthy lifestyle and preventative care can promote health and wellness.  Maintain regular health, dental, and eye exams.   Eat a healthy diet. Foods like vegetables, fruits, whole grains, low-fat dairy products, and lean protein foods contain the nutrients you need without too many calories. Decrease your intake of foods high in solid fats, added sugars, and salt. Get information about a proper diet from your caregiver, if necessary.   Regular physical exercise is one of the most important things you can do for your health. Most adults should get at least 150 minutes of moderate-intensity exercise (any activity that increases your heart rate and causes you to sweat) each week. In addition, most adults need muscle-strengthening exercises on 2 or more days a week.     Maintain a healthy weight. The body mass index (BMI) is a screening tool to identify possible weight problems. It provides an estimate of body fat based on height and weight. Your caregiver can help determine your BMI, and can help you achieve or maintain a healthy weight. For adults 20 years and older:   A BMI below 18.5 is considered underweight.   A BMI of 18.5 to 24.9 is normal.   A BMI of 25 to 29.9 is considered overweight.     A BMI of 30 and above is considered obese.   Maintain normal blood lipids and cholesterol by exercising and minimizing your intake of saturated fat. Eat a balanced diet with plenty of fruits and vegetables. Blood tests for lipids and cholesterol should begin at age 61 and be repeated every 5 years. If your lipid or cholesterol levels are high, you are over 50, or you are a high risk for heart disease, you may need your cholesterol levels checked more frequently. Ongoing high lipid and cholesterol levels should be treated with medicines if diet and exercise are not effective.   If you smoke, find out from your caregiver how to quit. If you do not use tobacco, do not start.   If you are pregnant, do not drink alcohol. If you are breastfeeding, be very cautious about drinking alcohol. If you are not pregnant and choose to drink alcohol, do not exceed 1 drink per day. One drink is considered to be 12 ounces (355 mL) of beer, 5 ounces (148 mL) of wine, or 1.5 ounces (44 mL) of liquor.   Avoid use of street drugs. Do not share needles with anyone. Ask for help if you need support or instructions about stopping the use of drugs.   High blood pressure causes heart disease and increases the risk of stroke. Blood pressure should be checked at least every 1 to 2 years. Ongoing high blood pressure should be treated with medicines, if weight loss and exercise are not effective.   If you are 62 to 68 years old, ask  your caregiver if you should take aspirin to prevent strokes.   Diabetes screening involves taking a blood sample to check your fasting blood sugar level. This should be done once every 3 years, after age 53, if you are within normal weight and without risk factors for diabetes. Testing should be considered at a younger age or be carried out more frequently if you are overweight and have at least 1 risk factor for diabetes.   Breast cancer screening is essential preventative care for women. You  should practice "breast self-awareness." This means understanding the normal appearance and feel of your breasts and may include breast self-examination. Any changes detected, no matter how small, should be reported to a caregiver. Women in their 23s and 30s should have a clinical breast exam (CBE) by a caregiver as part of a regular health exam every 1 to 3 years. After age 48, women should have a CBE every year. Starting at age 58, women should consider having a mammogram (breast X-ray) every year. Women who have a family history of breast cancer should talk to their caregiver about genetic screening. Women at a high risk of breast cancer should talk to their caregiver about having an MRI and a mammogram every year.   The Pap test is a screening test for cervical cancer. Women should have a Pap test starting at age 39. Between ages 52 and 61, Pap tests should be repeated every 2 years. Beginning at age 59, you should have a Pap test every 3 years as long as the past 3 Pap tests have been normal. If you had a hysterectomy for a problem that was not cancer or a condition that could lead to cancer, then you no longer need Pap tests. If you are between ages 58 and 68, and you have had normal Pap tests going back 10 years, you no longer need Pap tests. If you have had past treatment for cervical cancer or a condition that could lead to cancer, you need Pap tests and screening for cancer for at least 20 years after your treatment. If Pap tests have been discontinued, risk factors (such as a new sexual partner) need to be reassessed to determine if screening should be resumed. Some women have medical problems that increase the chance of getting cervical cancer. In these cases, your caregiver may recommend more frequent screening and Pap tests.   The human papillomavirus (HPV) test is an additional test that may be used for cervical cancer screening. The HPV test looks for the virus that can cause the cell changes on  the cervix. The cells collected during the Pap test can be tested for HPV. The HPV test could be used to screen women aged 33 years and older, and should be used in women of any age who have unclear Pap test results. After the age of 34, women should have HPV testing at the same frequency as a Pap test.   Colorectal cancer can be detected and often prevented. Most routine colorectal cancer screening begins at the age of 33 and continues through age 66. However, your caregiver may recommend screening at an earlier age if you have risk factors for colon cancer. On a yearly basis, your caregiver may provide home test kits to check for hidden blood in the stool. Use of a small camera at the end of a tube, to directly examine the colon (sigmoidoscopy or colonoscopy), can detect the earliest forms of colorectal cancer. Talk to your caregiver about this at  age 60, when routine screening begins. Direct examination of the colon should be repeated every 5 to 10 years through age 62, unless early forms of pre-cancerous polyps or small growths are found.   Hepatitis C blood testing is recommended for all people born from [birth year] through 1966 and any individual with known risks for hepatitis C.   Practice safe sex. Use condoms and avoid high-risk sexual practices to reduce the spread of sexually transmitted infections (STIs). Sexually active women aged 34 and younger should be checked for Chlamydia, which is a common sexually transmitted infection. Older women with new or multiple partners should also be tested for Chlamydia. Testing for other STIs is recommended if you are sexually active and at increased risk.   Osteoporosis is a disease in which the bones lose minerals and strength with aging. This can result in serious bone fractures. The risk of osteoporosis can be identified using a bone density scan. Women ages 3 and over and women at risk for fractures or osteoporosis should discuss screening with their caregivers.  Ask your caregiver whether you should be taking a calcium supplement or vitamin D to reduce the rate of osteoporosis.   Menopause can be associated with physical symptoms and risks. Hormone replacement therapy is available to decrease symptoms and risks. You should talk to your caregiver about whether hormone replacement therapy is right for you.   Use sunscreen with a sun protection factor (SPF) of 30 or greater. Apply sunscreen liberally and repeatedly throughout the day. You should seek shade when your shadow is shorter than you. Protect yourself by wearing long sleeves, pants, a wide-brimmed hat, and sunglasses year round, whenever you are outdoors.   Notify your caregiver of new moles or changes in moles, especially if there is a change in shape or color. Also notify your caregiver if a mole is larger than the size of a pencil eraser.   Stay current with your immunizations.  Document Released: 03/21/2011 Document Revised: 11/28/2011 Document Reviewed: 03/21/2011 [facility] Patient Information 2013 [place], [place].   DASH Diet The DASH diet stands for "Dietary Approaches to Stop Hypertension." It is a healthy eating plan that has been shown to reduce high blood pressure (hypertension) in as little as 14 days, while also possibly providing other significant health benefits. These other health benefits include reducing the risk of breast cancer after menopause and reducing the risk of type 2 diabetes, heart disease, colon cancer, and stroke. Health benefits also include weight loss and slowing kidney failure in patients with chronic kidney disease.   DIET GUIDELINES  Limit salt (sodium). Your diet should contain less than 1500 mg of sodium daily.   Limit refined or processed carbohydrates. Your diet should include mostly whole grains. Desserts and added sugars should be used sparingly.   Include small amounts of heart-healthy fats. These types of fats include nuts, oils, and tub margarine. Limit  saturated and trans fats. These fats have been shown to be harmful in the body.  CHOOSING FOODS   The following food groups are based on a 2000 calorie diet. See your Registered Dietitian for individual calorie needs. Grains and Grain Products (6 to 8 servings daily)  Eat More Often: Whole-wheat bread, brown rice, whole-grain or wheat pasta, quinoa, popcorn without added fat or salt (air popped).   Eat Less Often: White bread, white pasta, white rice, cornbread.  Vegetables (4 to 5 servings daily)  Eat More Often: Fresh, frozen, and canned vegetables. Vegetables may be raw, steamed, roasted,  or grilled with a minimal amount of fat.   Eat Less Often/Avoid: Creamed or fried vegetables. Vegetables in a cheese sauce.  Fruit (4 to 5 servings daily)  Eat More Often: All fresh, canned (in natural juice), or frozen fruits. Dried fruits without added sugar. One hundred percent fruit juice ( cup [237 mL] daily).   Eat Less Often: Dried fruits with added sugar. Canned fruit in light or heavy syrup.  [employer], Fish, and Poultry (2 servings or less daily. One serving is 3 to 4 oz [85-114 g]).  Eat More Often: Ninety percent or leaner ground beef, tenderloin, sirloin. Round cuts of beef, chicken breast, [place] breast. All fish. Grill, bake, or broil your meat. Nothing should be fried.   Eat Less Often/Avoid: Fatty cuts of meat, [place], or chicken leg, thigh, or wing. Fried cuts of meat or fish.  Dairy (2 to 3 servings)  Eat More Often: Low-fat or fat-free milk, low-fat plain or light yogurt, reduced-fat or part-skim cheese.   Eat Less Often/Avoid: Milk (whole, 2%). Whole milk yogurt. Full-fat cheeses.  Nuts, Seeds, and Legumes (4 to 5 servings per week)  Eat More Often: All without added salt.   Eat Less Often/Avoid: Salted nuts and seeds, canned beans with added salt.  Fats and Sweets (limited)  Eat More Often: Vegetable oils, tub margarines without trans fats, sugar-free gelatin. Mayonnaise  and salad dressings.   Eat Less Often/Avoid: Coconut oils, palm oils, butter, stick margarine, cream, half and half, cookies, candy, pie.  FOR MORE INFORMATION The Dash Diet Eating Plan: www.dashdiet.org Document Released: 08/25/2011 Document Revised: 11/28/2011 Document Reviewed: 08/25/2011 [facility] Patient Information 2013 [place], [place].   Hypertension Hypertension is another name for high blood pressure. High blood pressure may mean that your heart needs to work harder to pump blood. Blood pressure consists of two numbers, which includes a higher number over a lower number (example: 110/72). HOME CARE    Make lifestyle changes as told by your doctor. This may include weight loss and exercise.   Take your blood pressure medicine every day.   Limit how much salt you use.   Stop smoking if you smoke.   Do not use drugs.   Talk to your doctor if you are using decongestants or birth control pills. These medicines might make blood pressure higher.   Females should not drink more than 1 alcoholic drink per day. Males should not drink more than 2 alcoholic drinks per day.   See your doctor as told.  GET HELP RIGHT AWAY IF:    You have a blood pressure reading with a top number of 180 or higher.   You get a very bad headache.   You get blurred or changing vision.   You feel confused.   You feel weak, numb, or faint.   You get chest or belly (abdominal) pain.   You throw up (vomit).   You cannot breathe very well.  MAKE SURE YOU:    Understand these instructions.   Will watch your condition.   Will get help right away if you are not doing well or get worse.  Document Released: 02/22/2008 Document Revised: 11/28/2011 Document Reviewed: 02/22/2008 [facility] Patient Information 2013 [place], [place].

## 2012-10-08 NOTE — Progress Notes (Signed)
Subjective:    Patient ID: Rebekah Valentine, female    DOB: [birth year], 67 y.o.   MRN: [id number]  HPI   Here for medicare wellness  Diet: heart healthy or DM if diabetic Physical activity: sedentary Depression/mood screen: negative Hearing: intact to whispered voice Visual acuity: grossly normal, performs annual eye exam  ADLs: capable Fall risk: none Home safety: good Cognitive evaluation: intact to orientation, naming, recall and repetition EOL planning: adv directives, full code/ I agree  I have personally reviewed and have noted 1. The patient's medical and social history 2. Their use of alcohol, tobacco or illicit drugs 3. Their current medications and supplements 4. The patient's functional ability including ADL's, fall risks, home safety risks and hearing or visual impairment. 5. Diet and physical activities 6. Evidence for depression or mood disorders  Also reviewed chronic medical issues: Hypothyroid - the patient reports compliance with medication(s) as prescribed. Denies adverse side effects.  Continued insomnia - incomplete relief with amitriptyline - denies anxiety or depression   Past Medical History  Diagnosis Date  . COLONIC POLYPS, HX OF 11/2007  . SKIN CANCER, HX OF   . INSOMNIA   . HEMORRHOIDS   . TROCHANTERIC BURSITIS, RIGHT   . HYPERTENSION   . Breast cyst     recurrent   Family History  Problem Relation Age of Onset  . Arthritis Father   . Coronary artery disease Father   . Hypertension Father   . Breast cancer Paternal Aunt   . Diabetes Paternal Grandmother    History  Substance Use Topics  . Smoking status: Never Smoker   . Smokeless tobacco: Never Used     Comment: Married, lives with spouse. Retired asst. teacher-subs now [employer]  . Alcohol Use: Yes     Comment: occassional    Review of Systems  Constitutional: Positive for fatigue; Negative for fever or weight change.  Respiratory: Negative for cough and shortness of breath.     Cardiovascular: Negative for chest pain or palpitations.  Gastrointestinal: Negative for abdominal pain, no bowel changes.  Musculoskeletal: Negative for gait problem or joint swelling.  Skin: Negative for rash.  Neurological: Negative for dizziness or headache.  No other specific complaints in a complete review of systems (except as listed in HPI above).     Objective:   Physical Exam  BP 162/96  Pulse 85  Temp 98 F (36.7 C) (Oral)  Resp 16  Wt 184 lb (83.462 kg)  SpO2 97% Wt Readings from Last 3 Encounters:  10/08/12 184 lb (83.462 kg)  03/26/12 187 lb (84.823 kg)  09/16/11 178 lb 6.4 oz (80.922 kg)   Constitutional: She appears well-developed and well-nourished. No distress.  HENT: Head: Normocephalic and atraumatic. Ears: B TMs ok, no erythema or effusion; Nose: Nose normal. Mouth/Throat: Oropharynx is clear and moist. No oropharyngeal exudate.  Eyes: Conjunctivae and EOM are normal. Pupils are equal, round, and reactive to light. No scleral icterus.  Neck: Normal range of motion. Neck supple. No JVD present. No thyromegaly present.  Cardiovascular: Normal rate, regular rhythm and normal heart sounds.  No murmur heard. No BLE edema. Pulmonary/Chest: Effort normal and breath sounds normal. No respiratory distress. She has no wheezes.  Abdominal: Soft. Bowel sounds are normal. She exhibits no distension. There is no tenderness. no masses Musculoskeletal: Normal range of motion, no joint effusions. No gross deformities Neurological: She is alert and oriented to person, place, and time. No cranial nerve deficit. Coordination normal.  Skin: Skin is  warm and dry. No rash noted. No erythema.  Psychiatric: She has a normal mood and affect. Her behavior is normal. Judgment and thought content normal.   Lab Results  Component Value Date   WBC 4.3* 03/21/2011   HGB 14.2 03/21/2011   HCT 40.8 03/21/2011   PLT 280.0 03/21/2011   CHOL 176 03/21/2011   TRIG 66.0 03/21/2011   HDL 59.60 03/21/2011    ALT 23 03/21/2011   AST 20 03/21/2011   NA 141 03/21/2011   K 4.9 03/21/2011   CL 106 03/21/2011   CREATININE 0.9 03/21/2011   BUN 19 03/21/2011   CO2 31 03/21/2011   TSH 2.74 09/16/2011        Assessment & Plan:  AWV/v70.0 - Today patient counseled on age appropriate routine health concerns for screening and prevention, each reviewed and up to date or declined. Immunizations reviewed and up to date or declined. Labs reviewed. Risk factors for depression reviewed and negative. Hearing function and visual acuity are intact. ADLs screened and addressed as needed. Functional ability and level of safety reviewed and appropriate. Education, counseling and referrals performed based on assessed risks today. Patient provided with a copy of personalized plan for preventive services.  Fatigue - nonspecific symptoms/exam - check screening labs   Elevated blood pressure - manual recheck 160/78 today - pt reports generally <120/80 at home but will keep log and bring log + home BP cuff for nurse visit recheck to verify good home control- no medications recommended today but will consider if remains >140/90 - educated on diet/exercise  Also see problem list. Medications and labs reviewed today.

## 2012-10-23 ENCOUNTER — Ambulatory Visit (INDEPENDENT_AMBULATORY_CARE_PROVIDER_SITE_OTHER): Payer: Medicare PPO

## 2012-10-23 VITALS — BP 128/84

## 2012-10-23 DIAGNOSIS — R030 Elevated blood-pressure reading, without diagnosis of hypertension: Secondary | ICD-10-CM

## 2012-11-20 ENCOUNTER — Encounter: Payer: Self-pay | Admitting: Gastroenterology

## 2013-05-13 ENCOUNTER — Other Ambulatory Visit: Payer: Self-pay | Admitting: *Deleted

## 2013-05-13 MED ORDER — AMITRIPTYLINE HCL 10 MG PO TABS: 10.0000 mg | ORAL_TABLET | Freq: Every evening | ORAL | Status: DC | PRN

## 2013-10-09 ENCOUNTER — Ambulatory Visit (INDEPENDENT_AMBULATORY_CARE_PROVIDER_SITE_OTHER): Payer: Medicare PPO | Admitting: Internal Medicine

## 2013-10-09 ENCOUNTER — Encounter: Payer: Self-pay | Admitting: Internal Medicine

## 2013-10-09 ENCOUNTER — Other Ambulatory Visit (INDEPENDENT_AMBULATORY_CARE_PROVIDER_SITE_OTHER): Payer: Medicare PPO

## 2013-10-09 VITALS — BP 132/80 | HR 90 | Temp 98.0°F | Ht 64.0 in | Wt 185.8 lb

## 2013-10-09 DIAGNOSIS — E039 Hypothyroidism, unspecified: Secondary | ICD-10-CM

## 2013-10-09 DIAGNOSIS — Z136 Encounter for screening for cardiovascular disorders: Secondary | ICD-10-CM

## 2013-10-09 DIAGNOSIS — R5381 Other malaise: Secondary | ICD-10-CM

## 2013-10-09 DIAGNOSIS — Z1239 Encounter for other screening for malignant neoplasm of breast: Secondary | ICD-10-CM

## 2013-10-09 DIAGNOSIS — R5383 Other fatigue: Secondary | ICD-10-CM

## 2013-10-09 DIAGNOSIS — Z0000 Encounter for general adult medical examination without abnormal findings: Secondary | ICD-10-CM

## 2013-10-09 LAB — CBC WITH DIFFERENTIAL/PLATELET
Basophils Absolute: 0 10*3/uL (ref 0.0–0.1)
Basophils Relative: 0.3 % (ref 0.0–3.0)
EOS PCT: 1.5 % (ref 0.0–5.0)
Eosinophils Absolute: 0.1 10*3/uL (ref 0.0–0.7)
HCT: 40.8 % (ref 36.0–46.0)
Hemoglobin: 14.1 g/dL (ref 12.0–15.0)
LYMPHS PCT: 31.3 % (ref 12.0–46.0)
Lymphs Abs: 1.7 10*3/uL (ref 0.7–4.0)
MCHC: 34.6 g/dL (ref 30.0–36.0)
MCV: 83.7 fl (ref 78.0–100.0)
MONOS PCT: 7.5 % (ref 3.0–12.0)
Monocytes Absolute: 0.4 10*3/uL (ref 0.1–1.0)
NEUTROS PCT: 59.4 % (ref 43.0–77.0)
Neutro Abs: 3.2 10*3/uL (ref 1.4–7.7)
PLATELETS: 283 10*3/uL (ref 150.0–400.0)
RBC: 4.88 Mil/uL (ref 3.87–5.11)
RDW: 13.2 % (ref 11.5–14.6)
WBC: 5.3 10*3/uL (ref 4.5–10.5)

## 2013-10-09 LAB — BASIC METABOLIC PANEL
BUN: 22 mg/dL (ref 6–23)
CHLORIDE: 103 meq/L (ref 96–112)
CO2: 29 mEq/L (ref 19–32)
CREATININE: 1 mg/dL (ref 0.4–1.2)
Calcium: 9.3 mg/dL (ref 8.4–10.5)
GFR: 57.11 mL/min — ABNORMAL LOW (ref >60.00)
Glucose, Bld: 91 mg/dL (ref 70–99)
POTASSIUM: 4.5 meq/L (ref 3.5–5.1)
SODIUM: 140 meq/L (ref 135–145)

## 2013-10-09 LAB — LIPID PANEL
CHOL/HDL RATIO: 3
Cholesterol: 173 mg/dL (ref 0–200)
HDL: 58.1 mg/dL (ref >39.00)
LDL Cholesterol: 99 mg/dL (ref 0–99)
TRIGLYCERIDES: 79 mg/dL (ref 0.0–149.0)
VLDL: 15.8 mg/dL (ref 0.0–40.0)

## 2013-10-09 LAB — HEPATIC FUNCTION PANEL
ALBUMIN: 4.3 g/dL (ref 3.5–5.2)
ALK PHOS: 58 U/L (ref 39–117)
ALT: 22 U/L (ref 0–35)
AST: 21 U/L (ref 0–37)
Bilirubin, Direct: 0 mg/dL (ref 0.0–0.3)
TOTAL PROTEIN: 7.4 g/dL (ref 6.0–8.3)
Total Bilirubin: 0.4 mg/dL (ref 0.3–1.2)

## 2013-10-09 LAB — TSH: TSH: 1.65 u[IU]/mL (ref 0.35–5.50)

## 2013-10-09 NOTE — Patient Instructions (Addendum)
It was good to see you today.  We have reviewed your prior records including labs and tests today  Health Maintenance reviewed - all recommended immunizations and age-appropriate screenings are up-to-date.  we'll make referral to [facility] for mammogram screening. Our office will contact you regarding appointment(s) once made.  Test(s) ordered today. Your results will be released to [facility] (or called to you) after review, usually within 72hours after test completion. If any changes need to be made, you will be notified at that same time.  Medications reviewed and updated, no changes recommended at this time.  Please schedule followup in 12 months for annual exam and labs, call sooner if problems.  Health Maintenance, Female A healthy lifestyle and preventative care can promote health and wellness.  Maintain regular health, dental, and eye exams.  Eat a healthy diet. Foods like vegetables, fruits, whole grains, low-fat dairy products, and lean protein foods contain the nutrients you need without too many calories. Decrease your intake of foods high in solid fats, added sugars, and salt. Get information about a proper diet from your caregiver, if necessary.  Regular physical exercise is one of the most important things you can do for your health. Most adults should get at least 150 minutes of moderate-intensity exercise (any activity that increases your heart rate and causes you to sweat) each week. In addition, most adults need muscle-strengthening exercises on 2 or more days a week.   Maintain a healthy weight. The body mass index (BMI) is a screening tool to identify possible weight problems. It provides an estimate of body fat based on height and weight. Your caregiver can help determine your BMI, and can help you achieve or maintain a healthy weight. For adults 20 years and older:  A BMI below 18.5 is considered underweight.  A BMI of 18.5 to 24.9 is normal.  A BMI of 25 to 29.9 is  considered overweight.  A BMI of 30 and above is considered obese.  Maintain normal blood lipids and cholesterol by exercising and minimizing your intake of saturated fat. Eat a balanced diet with plenty of fruits and vegetables. Blood tests for lipids and cholesterol should begin at age 88 and be repeated every 5 years. If your lipid or cholesterol levels are high, you are over 50, or you are a high risk for heart disease, you may need your cholesterol levels checked more frequently.Ongoing high lipid and cholesterol levels should be treated with medicines if diet and exercise are not effective.  If you smoke, find out from your caregiver how to quit. If you do not use tobacco, do not start.  Lung cancer screening is recommended for adults aged 71 80 years who are at high risk for developing lung cancer because of a history of smoking. Yearly low-dose computed tomography (CT) is recommended for people who have at least a 30-pack-year history of smoking and are a current smoker or have quit within the past 15 years. A pack year of smoking is smoking an average of 1 pack of cigarettes a day for 1 year (for example: 1 pack a day for 30 years or 2 packs a day for 15 years). Yearly screening should continue until the smoker has stopped smoking for at least 15 years. Yearly screening should also be stopped for people who develop a health problem that would prevent them from having lung cancer treatment.  If you are pregnant, do not drink alcohol. If you are breastfeeding, be very cautious about drinking alcohol. If  you are not pregnant and choose to drink alcohol, do not exceed 1 drink per day. One drink is considered to be 12 ounces (355 mL) of beer, 5 ounces (148 mL) of wine, or 1.5 ounces (44 mL) of liquor.  Avoid use of street drugs. Do not share needles with anyone. Ask for help if you need support or instructions about stopping the use of drugs.  High blood pressure causes heart disease and increases  the risk of stroke. Blood pressure should be checked at least every 1 to 2 years. Ongoing high blood pressure should be treated with medicines, if weight loss and exercise are not effective.  If you are 73 to 69 years old, ask your caregiver if you should take aspirin to prevent strokes.  Diabetes screening involves taking a blood sample to check your fasting blood sugar level. This should be done once every 3 years, after age 14, if you are within normal weight and without risk factors for diabetes. Testing should be considered at a younger age or be carried out more frequently if you are overweight and have at least 1 risk factor for diabetes.  Breast cancer screening is essential preventative care for women. You should practice "breast self-awareness." This means understanding the normal appearance and feel of your breasts and may include breast self-examination. Any changes detected, no matter how small, should be reported to a caregiver. Women in their 106s and 30s should have a clinical breast exam (CBE) by a caregiver as part of a regular health exam every 1 to 3 years. After age 21, women should have a CBE every year. Starting at age 56, women should consider having a mammogram (breast X-ray) every year. Women who have a family history of breast cancer should talk to their caregiver about genetic screening. Women at a high risk of breast cancer should talk to their caregiver about having an MRI and a mammogram every year.  Breast cancer gene (BRCA)-related cancer risk assessment is recommended for women who have family members with BRCA-related cancers. BRCA-related cancers include breast, ovarian, tubal, and peritoneal cancers. Having family members with these cancers may be associated with an increased risk for harmful changes (mutations) in the breast cancer genes BRCA1 and BRCA2. Results of the assessment will determine the need for genetic counseling and BRCA1 and BRCA2 testing.  The Pap test  is a screening test for cervical cancer. Women should have a Pap test starting at age 59. Between ages 61 and 11, Pap tests should be repeated every 2 years. Beginning at age 63, you should have a Pap test every 3 years as long as the past 3 Pap tests have been normal. If you had a hysterectomy for a problem that was not cancer or a condition that could lead to cancer, then you no longer need Pap tests. If you are between ages 49 and 67, and you have had normal Pap tests going back 10 years, you no longer need Pap tests. If you have had past treatment for cervical cancer or a condition that could lead to cancer, you need Pap tests and screening for cancer for at least 20 years after your treatment. If Pap tests have been discontinued, risk factors (such as a new sexual partner) need to be reassessed to determine if screening should be resumed. Some women have medical problems that increase the chance of getting cervical cancer. In these cases, your caregiver may recommend more frequent screening and Pap tests.  The human papillomavirus (HPV)  test is an additional test that may be used for cervical cancer screening. The HPV test looks for the virus that can cause the cell changes on the cervix. The cells collected during the Pap test can be tested for HPV. The HPV test could be used to screen women aged 54 years and older, and should be used in women of any age who have unclear Pap test results. After the age of 55, women should have HPV testing at the same frequency as a Pap test.  Colorectal cancer can be detected and often prevented. Most routine colorectal cancer screening begins at the age of 28 and continues through age 50. However, your caregiver may recommend screening at an earlier age if you have risk factors for colon cancer. On a yearly basis, your caregiver may provide home test kits to check for hidden blood in the stool. Use of a small camera at the end of a tube, to directly examine the colon  (sigmoidoscopy or colonoscopy), can detect the earliest forms of colorectal cancer. Talk to your caregiver about this at age 85, when routine screening begins. Direct examination of the colon should be repeated every 5 to 10 years through age 79, unless early forms of pre-cancerous polyps or small growths are found.  Hepatitis C blood testing is recommended for all people born from [birth year] through 1966 and any individual with known risks for hepatitis C.  Practice safe sex. Use condoms and avoid high-risk sexual practices to reduce the spread of sexually transmitted infections (STIs). Sexually active women aged 74 and younger should be checked for Chlamydia, which is a common sexually transmitted infection. Older women with new or multiple partners should also be tested for Chlamydia. Testing for other STIs is recommended if you are sexually active and at increased risk.  Osteoporosis is a disease in which the bones lose minerals and strength with aging. This can result in serious bone fractures. The risk of osteoporosis can be identified using a bone density scan. Women ages 79 and over and women at risk for fractures or osteoporosis should discuss screening with their caregivers. Ask your caregiver whether you should be taking a calcium supplement or vitamin D to reduce the rate of osteoporosis.  Menopause can be associated with physical symptoms and risks. Hormone replacement therapy is available to decrease symptoms and risks. You should talk to your caregiver about whether hormone replacement therapy is right for you.  Use sunscreen. Apply sunscreen liberally and repeatedly throughout the day. You should seek shade when your shadow is shorter than you. Protect yourself by wearing long sleeves, pants, a wide-brimmed hat, and sunglasses year round, whenever you are outdoors.  Notify your caregiver of new moles or changes in moles, especially if there is a change in shape or color. Also notify your  caregiver if a mole is larger than the size of a pencil eraser.  Stay current with your immunizations. Document Released: 03/21/2011 Document Revised: 12/31/2012 Document Reviewed: 03/21/2011 [facility] Patient Information 2014 [place].

## 2013-10-09 NOTE — Assessment & Plan Note (Signed)
Started low dose meds for same 03/2011 Recheck now and adjust as needed Lab Results  Component Value Date   TSH 2.74 09/16/2011

## 2013-10-09 NOTE — Progress Notes (Signed)
Subjective:    Patient ID: Rebekah Valentine, female    DOB: Feb 16, 1946, 68 y.o.   MRN: [id number]  HPI  Here for medicare wellness/annual physical  Diet: heart healthy  Physical activity: sedentary Depression/mood screen: negative Hearing: intact to whispered voice Visual acuity: grossly normal, performs annual eye exam  ADLs: capable Fall risk: none Home safety: good Cognitive evaluation: intact to orientation, naming, recall and repetition EOL planning: adv directives, full code/ I agree  I have personally reviewed and have noted 1. The patient's medical and social history 2. Their use of alcohol, tobacco or illicit drugs 3. Their current medications and supplements 4. The patient's functional ability including ADL's, fall risks, home safety risks and hearing or visual impairment. 5. Diet and physical activities 6. Evidence for depression or mood disorders  Also reviewed chronic medical issues: Hypothyroid - the patient reports compliance with medication(s) as prescribed. Denies adverse side effects.  Chronic insomnia - incomplete relief with amitriptyline - denies anxiety or depression   Past Medical History  Diagnosis Date  . COLONIC POLYPS, HX OF 11/2007  . SKIN CANCER, HX OF   . INSOMNIA   . HEMORRHOIDS   . TROCHANTERIC BURSITIS, RIGHT   . HYPERTENSION   . Breast cyst     recurrent   Family History  Problem Relation Age of Onset  . Arthritis Father   . Coronary artery disease Father   . Hypertension Father   . Breast cancer Paternal Aunt   . Diabetes Paternal Grandmother   . COPD Sister     smoker  . Congestive Heart Failure Sister   . Blindness Sister     since birth  . COPD Mother     smoker   History  Substance Use Topics  . Smoking status: Never Smoker   . Smokeless tobacco: Never Used     Comment: Married, lives with spouse. Retired asst. teacher-subs now [employer]  . Alcohol Use: Yes     Comment: occassional    Review of Systems   Constitutional: Positive for fatigue. Negative for unexpected weight change.  Respiratory: Negative for cough, shortness of breath and wheezing.   Cardiovascular: Negative for chest pain, palpitations and leg swelling.  Gastrointestinal: Negative for nausea, abdominal pain and diarrhea.  Neurological: Negative for dizziness, weakness, light-headedness and headaches.  Psychiatric/Behavioral: Negative for dysphoric mood. The patient is not nervous/anxious.   All other systems reviewed and are negative.        Objective:   Physical Exam BP 132/80  Pulse 90  Temp(Src) 98 F (36.7 C) (Oral)  Ht 5\' 4"  (1.626 m)  Wt 185 lb 12.8 oz (84.278 kg)  BMI 31.88 kg/m2  SpO2 97% Wt Readings from Last 3 Encounters:  10/09/13 185 lb 12.8 oz (84.278 kg)  10/08/12 184 lb (83.462 kg)  03/26/12 187 lb (84.823 kg)   Constitutional: She is overweight, but appears well-developed and well-nourished. No distress.  HENT: Head: Normocephalic and atraumatic. Ears: B TMs ok, no erythema or effusion; Nose: Nose normal. Mouth/Throat: Oropharynx is clear and moist. No oropharyngeal exudate.  Eyes: Conjunctivae and EOM are normal. Pupils are equal, round, and reactive to light. No scleral icterus.  Neck: Normal range of motion. Neck supple. No JVD present. No thyromegaly present.  Cardiovascular: Normal rate, regular rhythm and normal heart sounds.  No murmur heard. No BLE edema. Pulmonary/Chest: Effort normal and breath sounds normal. No respiratory distress. She has no wheezes.  Abdominal: Soft. Bowel sounds are normal. She exhibits no distension.  There is no tenderness. no masses Musculoskeletal: Normal range of motion, no joint effusions. No gross deformities Neurological: She is alert and oriented to person, place, and time. No cranial nerve deficit. Coordination normal.  Skin: Skin is warm and dry. No rash noted. No erythema.  Psychiatric: She has a normal mood and affect. Her behavior is normal. Judgment  and thought content normal.   Lab Results  Component Value Date   WBC 5.4 10/08/2012   HGB 14.8 10/08/2012   HCT 43.4 10/08/2012   PLT 282.0 10/08/2012   CHOL 176 03/21/2011   TRIG 66.0 03/21/2011   HDL 59.60 03/21/2011   ALT 22 10/08/2012   AST 22 10/08/2012   NA 139 10/08/2012   K 5.0 10/08/2012   CL 102 10/08/2012   CREATININE 0.9 10/08/2012   BUN 20 10/08/2012   CO2 30 10/08/2012   TSH 2.74 09/16/2011   ECG: sinus @ 80 bpm - no ischemic change or arrythmia     Assessment & Plan:  CPXAWV/v70.0 - Today patient counseled on age appropriate routine health concerns for screening and prevention, each reviewed and up to date or declined. Immunizations reviewed and up to date or declined. Labs/ECG ordered and reviewed. Risk factors for depression reviewed and negative. Hearing function and visual acuity are intact. ADLs screened and addressed as needed. Functional ability and level of safety reviewed and appropriate. Education, counseling and referrals performed based on assessed risks today. Patient provided with a copy of personalized plan for preventive services.  Fatigue - nonspecific symptoms/exam - check screening labs  Also see problem list. Medications and labs reviewed today.

## 2013-10-09 NOTE — Progress Notes (Signed)
Pre-visit discussion using our clinic review tool. No additional management support is needed unless otherwise documented below in the visit note.  

## 2013-11-13 LAB — HM MAMMOGRAPHY

## 2013-11-15 ENCOUNTER — Encounter: Payer: Self-pay | Admitting: Internal Medicine

## 2013-11-18 ENCOUNTER — Encounter: Payer: Self-pay | Admitting: Gynecology

## 2013-12-09 ENCOUNTER — Other Ambulatory Visit: Payer: Self-pay | Admitting: Internal Medicine

## 2014-01-20 ENCOUNTER — Other Ambulatory Visit: Payer: Self-pay | Admitting: Internal Medicine

## 2014-07-18 ENCOUNTER — Other Ambulatory Visit: Payer: Self-pay | Admitting: Internal Medicine

## 2014-07-21 ENCOUNTER — Encounter: Payer: Self-pay | Admitting: Internal Medicine

## 2014-10-13 ENCOUNTER — Encounter: Payer: Self-pay | Admitting: Family

## 2014-10-13 ENCOUNTER — Ambulatory Visit (INDEPENDENT_AMBULATORY_CARE_PROVIDER_SITE_OTHER): Payer: Medicare PPO | Admitting: Family

## 2014-10-13 ENCOUNTER — Encounter: Payer: Medicare PPO | Admitting: Internal Medicine

## 2014-10-13 VITALS — BP 134/92 | HR 87 | Temp 98.0°F | Resp 18 | Ht 63.25 in | Wt 188.0 lb

## 2014-10-13 DIAGNOSIS — E039 Hypothyroidism, unspecified: Secondary | ICD-10-CM

## 2014-10-13 DIAGNOSIS — G4700 Insomnia, unspecified: Secondary | ICD-10-CM

## 2014-10-13 DIAGNOSIS — Z23 Encounter for immunization: Secondary | ICD-10-CM

## 2014-10-13 DIAGNOSIS — IMO0001 Reserved for inherently not codable concepts without codable children: Secondary | ICD-10-CM

## 2014-10-13 DIAGNOSIS — K219 Gastro-esophageal reflux disease without esophagitis: Secondary | ICD-10-CM

## 2014-10-13 DIAGNOSIS — R030 Elevated blood-pressure reading, without diagnosis of hypertension: Secondary | ICD-10-CM

## 2014-10-13 NOTE — Progress Notes (Signed)
Pre visit review using our clinic review tool, if applicable. No additional management support is needed unless otherwise documented below in the visit note. 

## 2014-10-13 NOTE — Progress Notes (Signed)
Subjective:    Patient ID: Rebekah Valentine, female    DOB: [birth year], 69 y.o.   MRN: [id number]  HPI  Rebekah Valentine is a 69 yr old female who presents today to establish care. She has been followed [facility] by Dr. Asa Lente previously.   pmhx is significant for hypothyroidism- maintained on levothyroxine.  Feels well on this dose.    HTN-Patient is currently maintained on the following medications for blood pressure:none, reports that she has hx of white coat hypertension.  Patient reports good compliance with blood pressure medications. Patient denies chest pain, shortness of breath or swelling. Last 3 blood pressure readings in our office are as follows: BP Readings from Last 3 Encounters:  10/13/14 134/92  10/09/13 132/80  10/23/12 128/84   Insomnia- reports that she often has trouble sleeping but uses amityptyline about every night and this helps.   GERD- well controlled by prilosec otc.     Review of Systems See HPI  Past Medical History  Diagnosis Date  . COLONIC POLYPS, HX OF 11/2007  . SKIN CANCER, HX OF   . INSOMNIA   . HEMORRHOIDS   . TROCHANTERIC BURSITIS, RIGHT   . HYPERTENSION   . Breast cyst     recurrent    History   Social History  . Marital Status: Married    Spouse Name: N/A    Number of Children: N/A  . Years of Education: N/A   Occupational History  . Not on file.   Social History Main Topics  . Smoking status: Never Smoker   . Smokeless tobacco: Never Used     Comment: Married, lives with spouse. Retired asst. teacher-subs now [employer]  . Alcohol Use: 0.6 - 1.2 oz/week    1-2 Glasses of wine per week     Comment: occassional glass of wine  . Drug Use: No  . Sexual Activity: No   Other Topics Concern  . Not on file   Social History Narrative    Past Surgical History  Procedure Laterality Date  . Colon polpectomy  11/2007    Dr. Fuller Plan  . Lumpectomy Right 1990    breast  . Tonsillectomy  1957  . Hysteroscopy      polyp  .  Tonsillectomy and adenoidectomy      Family History  Problem Relation Age of Onset  . Arthritis Father   . Coronary artery disease Father   . Hypertension Father   . Heart disease Father   . Breast cancer Paternal Aunt   . Diabetes Paternal Grandmother   . COPD Sister     smoker  . Blindness Sister     since birth  . Congestive Heart Failure Sister   . COPD Mother     smoker    No Known Allergies  Current Outpatient Prescriptions on File Prior to Visit  Medication Sig Dispense Refill  . amitriptyline (ELAVIL) 10 MG tablet TAKE 1-2 TABLETS BY MOUTH EVERY NIGHT AT BEDTIME AS NEEDED FOR SLEEP 180 tablet 1  . aspirin 81 MG tablet Take 81 mg by mouth 2 (two) times a week. Wed & Sat    . Calcium Carbonate-Vit D-Min (CALCIUM 1200 PO) Take by mouth 2 (two) times daily.      . Cyanocobalamin (VITAMIN B-12 CR PO) Take by mouth.    . cycloSPORINE (RESTASIS) 0.05 % ophthalmic emulsion 1 drop every 12 (twelve) hours.      Marland Kitchen KRILL OIL PO Take by mouth.    Marland Kitchen  levothyroxine (SYNTHROID, LEVOTHROID) 25 MCG tablet TAKE 1 TABLET BY MOUTH EVERY DAY 90 tablet 3  . Magnesium 250 MG TABS Take by mouth 3 (three) times a week. Mon, Wed, and Fri     . Misc Natural Products (OSTEO BI-FLEX JOINT SHIELD) TABS Take by mouth daily.      . Multiple Vitamins-Minerals (ONE-A-DAY EXTRAS ANTIOXIDANT) CAPS Take by mouth daily.       No current facility-administered medications on file prior to visit.    BP 134/92 mmHg  Pulse 87  Temp(Src) 98 F (36.7 C) (Oral)  Resp 18  Ht 5' 3.25" (1.607 m)  Wt 188 lb (85.276 kg)  BMI 33.02 kg/m2  SpO2 99%       Objective:   Physical Exam  Constitutional: She is oriented to person, place, and time. She appears well-developed and well-nourished. No distress.  HENT:  Head: Normocephalic and atraumatic.  Eyes: No scleral icterus.  Cardiovascular: Normal rate and regular rhythm.   No murmur heard. Pulmonary/Chest: Effort normal and breath sounds normal. No  respiratory distress. She has no wheezes. She has no rales. She exhibits no tenderness.  Abdominal: Soft. Bowel sounds are normal. She exhibits no distension. There is no tenderness.  Musculoskeletal: She exhibits no edema.  Lymphadenopathy:    She has no cervical adenopathy.  Neurological: She is alert and oriented to person, place, and time.  Skin: Skin is warm and dry.  Psychiatric: She has a normal mood and affect. Her behavior is normal. Judgment and thought content normal.          Assessment & Plan:

## 2014-10-13 NOTE — Patient Instructions (Signed)
Please complete your lab work prior to leaving. Schedule medicare wellness at the front desk. Welcome to [facility]!

## 2014-10-14 ENCOUNTER — Encounter: Payer: Self-pay | Admitting: Family

## 2014-10-14 DIAGNOSIS — K219 Gastro-esophageal reflux disease without esophagitis: Secondary | ICD-10-CM | POA: Insufficient documentation

## 2014-10-14 LAB — TSH: TSH: 3 u[IU]/mL (ref 0.35–4.50)

## 2014-10-14 NOTE — Assessment & Plan Note (Addendum)
DBP mildly elevated. Not on meds, advised pt to limit sodium- will continue to monitor. Marland Kitchen

## 2014-10-14 NOTE — Assessment & Plan Note (Signed)
Stable on amitryptyline. Continue same

## 2014-10-14 NOTE — Assessment & Plan Note (Signed)
Lab Results  Component Value Date   TSH 3.00 10/13/2014   TSH looks good on current dose of synthroid, continue same.

## 2014-10-14 NOTE — Assessment & Plan Note (Signed)
Stable on PPI. Continue same.  

## 2014-11-08 ENCOUNTER — Encounter: Payer: Self-pay | Admitting: Gastroenterology

## 2014-11-11 ENCOUNTER — Encounter: Payer: Self-pay | Admitting: Gastroenterology

## 2014-11-21 ENCOUNTER — Telehealth: Payer: Self-pay | Admitting: *Deleted

## 2014-11-21 ENCOUNTER — Encounter: Payer: Self-pay | Admitting: *Deleted

## 2014-11-21 NOTE — Telephone Encounter (Signed)
Pre-Visit Call completed with patient and chart updated.   Pre-Visit Info documented in Specialty Comments under [facility].    

## 2014-11-24 ENCOUNTER — Ambulatory Visit (INDEPENDENT_AMBULATORY_CARE_PROVIDER_SITE_OTHER): Payer: Medicare PPO | Admitting: Family

## 2014-11-24 ENCOUNTER — Encounter: Payer: Self-pay | Admitting: Family

## 2014-11-24 VITALS — BP 136/88 | HR 92 | Temp 97.8°F | Resp 16 | Ht 63.25 in | Wt 187.8 lb

## 2014-11-24 DIAGNOSIS — E2839 Other primary ovarian failure: Secondary | ICD-10-CM

## 2014-11-24 DIAGNOSIS — E039 Hypothyroidism, unspecified: Secondary | ICD-10-CM

## 2014-11-24 DIAGNOSIS — Z0000 Encounter for general adult medical examination without abnormal findings: Secondary | ICD-10-CM | POA: Insufficient documentation

## 2014-11-24 LAB — CBC WITH DIFFERENTIAL/PLATELET
Basophils Absolute: 0 10*3/uL (ref 0.0–0.1)
Basophils Relative: 0.4 % (ref 0.0–3.0)
Eosinophils Absolute: 0 10*3/uL (ref 0.0–0.7)
Eosinophils Relative: 0.7 % (ref 0.0–5.0)
HCT: 41.1 % (ref 36.0–46.0)
HEMOGLOBIN: 14.1 g/dL (ref 12.0–15.0)
LYMPHS PCT: 23.1 % (ref 12.0–46.0)
Lymphs Abs: 1.3 10*3/uL (ref 0.7–4.0)
MCHC: 34.4 g/dL (ref 30.0–36.0)
MCV: 83.6 fl (ref 78.0–100.0)
Monocytes Absolute: 0.4 10*3/uL (ref 0.1–1.0)
Monocytes Relative: 6.7 % (ref 3.0–12.0)
NEUTROS ABS: 3.8 10*3/uL (ref 1.4–7.7)
NEUTROS PCT: 69.1 % (ref 43.0–77.0)
Platelets: 293 10*3/uL (ref 150.0–400.0)
RBC: 4.92 Mil/uL (ref 3.87–5.11)
RDW: 12.9 % (ref 11.5–15.5)
WBC: 5.5 10*3/uL (ref 4.0–10.5)

## 2014-11-24 LAB — LIPID PANEL
CHOLESTEROL: 176 mg/dL (ref 0–200)
HDL: 51.7 mg/dL (ref >39.00)
LDL Cholesterol: 101 mg/dL — ABNORMAL HIGH (ref 0–99)
NonHDL: 124.3
Total CHOL/HDL Ratio: 3
Triglycerides: 117 mg/dL (ref 0.0–149.0)
VLDL: 23.4 mg/dL (ref 0.0–40.0)

## 2014-11-24 LAB — BASIC METABOLIC PANEL
BUN: 19 mg/dL (ref 6–23)
CO2: 30 meq/L (ref 19–32)
Calcium: 9.5 mg/dL (ref 8.4–10.5)
Chloride: 104 mEq/L (ref 96–112)
Creatinine, Ser: 0.87 mg/dL (ref 0.40–1.20)
GFR: 68.39 mL/min (ref >60.00)
Glucose, Bld: 84 mg/dL (ref 70–99)
POTASSIUM: 3.9 meq/L (ref 3.5–5.1)
SODIUM: 140 meq/L (ref 135–145)

## 2014-11-24 LAB — URINALYSIS, ROUTINE W REFLEX MICROSCOPIC
BILIRUBIN URINE: NEGATIVE
Hgb urine dipstick: NEGATIVE
KETONES UR: NEGATIVE
Leukocytes, UA: NEGATIVE
Nitrite: NEGATIVE
RBC / HPF: NONE SEEN (ref 0–?)
SPECIFIC GRAVITY, URINE: 1.015 (ref 1.000–1.030)
Total Protein, Urine: NEGATIVE
UROBILINOGEN UA: 0.2 (ref 0.0–1.0)
Urine Glucose: NEGATIVE
WBC, UA: NONE SEEN (ref 0–?)
pH: 6 (ref 5.0–8.0)

## 2014-11-24 LAB — HEPATIC FUNCTION PANEL
ALBUMIN: 4.4 g/dL (ref 3.5–5.2)
ALK PHOS: 62 U/L (ref 39–117)
ALT: 16 U/L (ref 0–35)
AST: 18 U/L (ref 0–37)
Bilirubin, Direct: 0.1 mg/dL (ref 0.0–0.3)
TOTAL PROTEIN: 7 g/dL (ref 6.0–8.3)
Total Bilirubin: 0.4 mg/dL (ref 0.2–1.2)

## 2014-11-24 NOTE — Assessment & Plan Note (Addendum)
No need to continue paps at her age. Colo up to date, mammo up to date, immunizations up to date. Obtain routine lab work. Due for dexa scan.

## 2014-11-24 NOTE — Progress Notes (Signed)
Subjective:    Rebekah Valentine is a 69 y.o. female who presents for Medicare Annual/Subsequent preventive examination.  Preventive Screening-Counseling & Management  Tobacco History  Smoking status  . Never Smoker   Smokeless tobacco  . Never Used    Comment: Married, lives with spouse. Retired asst. teacher-subs now K-5     Problems Prior to Visit  1. GERD- maintained on omeprazole. reports gerd is stable  2. Hypothyroid- on synthroid. Reports feeling well on current dose.  Lab Results  Component Value Date   TSH 3.00 10/13/2014    3. Elevated blood pressure-  BP Readings from Last 3 Encounters:  11/24/14 136/88  10/13/14 134/92  10/09/13 132/80   4. Hx of skin cancer- She sees Dr. Danella Sensing at [facility] derm. Has not seen a few years.   Current Problems (verified) Patient Active Problem List   Diagnosis Date Noted  . GERD (gastroesophageal reflux disease) 10/14/2014  . Hypothyroidism 03/21/2011  . TROCHANTERIC BURSITIS, RIGHT 01/07/2010  . FATIGUE 01/06/2010  . Elevated blood pressure 12/30/2008  . Insomnia 12/30/2008  . HEMORRHOIDS 12/12/2008  . SKIN CANCER, HX OF 12/12/2008  . COLONIC POLYPS, HX OF 12/12/2008    Medications Prior to Visit Current Outpatient Prescriptions on File Prior to Visit  Medication Sig Dispense Refill  . amitriptyline (ELAVIL) 10 MG tablet TAKE 1-2 TABLETS BY MOUTH EVERY NIGHT AT BEDTIME AS NEEDED FOR SLEEP 180 tablet 1  . aspirin 81 MG tablet Take 81 mg by mouth 2 (two) times a week. Wed & Sat    . Calcium Carbonate-Vit D-Min (CALCIUM 1200 PO) Take 1 tablet by mouth every morning. 600mg  calcium and 500mg  vitamin D each tablet    . Cholecalciferol (VITAMIN D3) 2000 UNITS TABS Take 1 capsule by mouth daily.    . Cyanocobalamin (VITAMIN B-12 CR PO) Take by mouth.    . cycloSPORINE (RESTASIS) 0.05 % ophthalmic emulsion 1 drop every 12 (twelve) hours.      Marland Kitchen KRILL OIL PO Take by mouth.    . levothyroxine (SYNTHROID, LEVOTHROID) 25 MCG  tablet TAKE 1 TABLET BY MOUTH EVERY DAY 90 tablet 3  . Magnesium 250 MG TABS Take by mouth 3 (three) times a week. Mon, Wed, and Fri     . Misc Natural Products (OSTEO BI-FLEX JOINT SHIELD) TABS Take by mouth daily.      . Multiple Vitamins-Minerals (ONE-A-DAY EXTRAS ANTIOXIDANT) CAPS Take by mouth daily.     . Omeprazole Magnesium 20.6 (20 BASE) MG CPDR Take 1 capsule by mouth daily.     No current facility-administered medications on file prior to visit.    Current Medications (verified) Current Outpatient Prescriptions  Medication Sig Dispense Refill  . amitriptyline (ELAVIL) 10 MG tablet TAKE 1-2 TABLETS BY MOUTH EVERY NIGHT AT BEDTIME AS NEEDED FOR SLEEP 180 tablet 1  . aspirin 81 MG tablet Take 81 mg by mouth 2 (two) times a week. Wed & Sat    . Calcium Carbonate-Vit D-Min (CALCIUM 1200 PO) Take 1 tablet by mouth every morning. 600mg  calcium and 500mg  vitamin D each tablet    . Cholecalciferol (VITAMIN D3) 2000 UNITS TABS Take 1 capsule by mouth daily.    . Cyanocobalamin (VITAMIN B-12 CR PO) Take by mouth.    . cycloSPORINE (RESTASIS) 0.05 % ophthalmic emulsion 1 drop every 12 (twelve) hours.      Marland Kitchen KRILL OIL PO Take by mouth.    . levothyroxine (SYNTHROID, LEVOTHROID) 25 MCG tablet TAKE 1 TABLET BY  MOUTH EVERY DAY 90 tablet 3  . Magnesium 250 MG TABS Take by mouth 3 (three) times a week. Mon, Wed, and Fri     . Misc Natural Products (OSTEO BI-FLEX JOINT SHIELD) TABS Take by mouth daily.      . Multiple Vitamins-Minerals (ONE-A-DAY EXTRAS ANTIOXIDANT) CAPS Take by mouth daily.     . Omeprazole Magnesium 20.6 (20 BASE) MG CPDR Take 1 capsule by mouth daily.     No current facility-administered medications for this visit.     Allergies (verified) Review of patient's allergies indicates no known allergies.   PAST HISTORY  Family History Family History  Problem Relation Age of Onset  . Arthritis Father   . Coronary artery disease Father   . Hypertension Father   . Heart  disease Father   . Breast cancer Paternal Aunt   . Diabetes Paternal Grandmother   . COPD Sister     smoker  . Blindness Sister     since birth  . Congestive Heart Failure Sister   . COPD Mother     smoker    Social History History  Substance Use Topics  . Smoking status: Never Smoker   . Smokeless tobacco: Never Used     Comment: Married, lives with spouse. Retired asst. teacher-subs now [employer]  . Alcohol Use: 0.6 - 1.2 oz/week    1-2 Glasses of wine per week     Comment: occassional glass of wine     Are there smokers in your home (other than you)? No  Risk Factors Current exercise habits: walks her dog  Dietary issues discussed: reports that diet is fair   Cardiac risk factors: advanced age (older than 60 for men, 31 for women), hypertension and obesity (BMI >= 30 kg/m2).  Depression Screen (Note: if answer to either of the following is "Yes", a more complete depression screening is indicated)   Over the past two weeks, have you felt down, depressed or hopeless? No  Over the past two weeks, have you felt little interest or pleasure in doing things? No  Have you lost interest or pleasure in daily life? No  Do you often feel hopeless? No  Do you cry easily over simple problems? No  Activities of Daily Living In your present state of health, do you have any difficulty performing the following activities?:  Driving? No Managing money?  No Feeding yourself? No Getting from bed to chair? No Climbing a flight of stairs? No Preparing food and eating?: No Bathing or showering? No Getting dressed: No Getting to the toilet? No Using the toilet:No Moving around from place to place: No In the past year have you fallen or had a near fall?:Yes   Are you sexually active?  Yes  Do you have more than one partner?  No  Hearing Difficulties: No Do you often ask people to speak up or repeat themselves? No Do you experience ringing or noises in your ears? No Do you have  difficulty understanding soft or whispered voices? No   Do you feel that you have a problem with memory? No  Do you often misplace items? No  Do you feel safe at home?  yes  Cognitive Testing  Alert? Yes  Normal Appearance?Yes  Oriented to person? Yes  Place? Yes   Time? Yes  Recall of three objects?  Yes  Can perform simple calculations? Yes  Displays appropriate judgment?Yes  Can read the correct time from a watch face?Yes   Advanced Directives  have been discussed with the patient? Yes  List the Names of Other Physician/Practitioners you currently use: 1.    Indicate any recent Medical Services you may have received from other than [facility] providers in the past year (date may be approximate).  Immunization History  Administered Date(s) Administered  . Influenza Split 07/10/2012, 06/19/2014  . Influenza Whole 10/19/2009, 07/06/2010  . Influenza, High Dose Seasonal PF 06/19/2013  . Pneumococcal Conjugate-13 10/13/2014  . Pneumococcal Polysaccharide-23 12/30/2008  . Td 03/02/2002  . Tdap 03/21/2011  . Zoster 10/25/2007    Screening Tests Health Maintenance  Topic Date Due  . INFLUENZA VACCINE  04/20/2015  . PNA vac Low Risk Adult (2 of 2 - PPSV23) 10/14/2015  . MAMMOGRAM  11/14/2015  . COLONOSCOPY  12/05/2017  . TETANUS/TDAP  03/20/2021  . DEXA SCAN  Completed  . ZOSTAVAX  Addressed    All answers were reviewed with the patient and necessary referrals were made:  O'SULLIVAN,Imre Vecchione S., NP   11/24/2014   History reviewed: allergies, current medications, past family history, past medical history, past social history, past surgical history and problem list  Review of Systems   Const: stable weight ENT: denies rhinorrhea Cv: Denies CP/SOB or swelling Resp: denies cough GU-  Denies dysuria/frequency Abd- denies n/v/d Skin- denies skin rashes, concerning lesion Neuro- denies HA Psych- denies depression/psych Heme- denies bruising/bleeding Objective:     Vision  by Snellen chart: see vision Body mass index is 32.99 kg/(m^2). BP 136/88 mmHg  Pulse 92  Temp(Src) 97.8 F (36.6 C) (Oral)  Resp 16  Ht 5' 3.25" (1.607 m)  Wt 187 lb 12.8 oz (85.186 kg)  BMI 32.99 kg/m2  SpO2 97%    Physical Exam  Constitutional: She is oriented to person, place, and time. She appears well-developed and well-nourished. No distress.  HENT:  Head: Normocephalic and atraumatic.  Right Ear: Tympanic membrane and ear canal normal.  Left Ear: Tympanic membrane and ear canal normal.  Mouth/Throat: Oropharynx is clear and moist.  Eyes: Pupils are equal, round, and reactive to light. No scleral icterus.  Neck: Normal range of motion. No thyromegaly present.  Cardiovascular: Normal rate and regular rhythm.   No murmur heard. Pulmonary/Chest: Effort normal and breath sounds normal. No respiratory distress. He has no wheezes. She has no rales. She exhibits no tenderness.  Abdominal: Soft. Bowel sounds are normal. He exhibits no distension and no mass. There is no tenderness. There is no rebound and no guarding.  Musculoskeletal: She exhibits no edema.  Lymphadenopathy:    She has no cervical adenopathy.  Neurological: She is alert and oriented to person, place, and time. She has normal patellar reflexes. She exhibits normal muscle tone. Coordination normal.  Skin: Skin is warm and dry.  Psychiatric: She has a normal mood and affect. Her behavior is normal. Judgment and thought content normal.  Breasts: Examined lying Right: Without masses, retractions, discharge or axillary adenopathy.  Left: Without masses, retractions, discharge or axillary adenopathy.  Pelvic: deferred         Assessment & Plan:      Assessment:          Plan:     During the course of the visit the patient was educated and counseled about appropriate screening and preventive services including: follow up screening with dermatology.   Diet review for nutrition referral? Yes ____  Not  Indicated x____   Patient Instructions (the written plan) was given to the patient.  Medicare Attestation I have personally  reviewed: The patient's medical and social history Their use of alcohol, tobacco or illicit drugs Their current medications and supplements The patient's functional ability including ADLs,fall risks, home safety risks, cognitive, and hearing and visual impairment Diet and physical activities Evidence for depression or mood disorders  The patient's weight, height, BMI, and visual acuity have been recorded in the chart.  I have made referrals, counseling, and provided education to the patient based on review of the above and I have provided the patient with a written personalized care plan for preventive services.     O'SULLIVAN,Orian Amberg S., NP   11/24/2014

## 2014-11-24 NOTE — Progress Notes (Signed)
Pre visit review using our clinic review tool, if applicable. No additional management support is needed unless otherwise documented below in the visit note. 

## 2014-11-24 NOTE — Patient Instructions (Signed)
Please complete lab work prior to leaving.  Follow up in 6 months.  

## 2014-11-25 ENCOUNTER — Encounter: Payer: Self-pay | Admitting: Family

## 2014-11-30 ENCOUNTER — Other Ambulatory Visit: Payer: Self-pay | Admitting: Internal Medicine

## 2014-12-15 ENCOUNTER — Encounter: Payer: Self-pay | Admitting: Family

## 2014-12-22 ENCOUNTER — Ambulatory Visit (AMBULATORY_SURGERY_CENTER): Payer: Self-pay | Admitting: *Deleted

## 2014-12-22 VITALS — Ht 64.0 in | Wt 191.0 lb

## 2014-12-22 DIAGNOSIS — Z86010 Personal history of colonic polyps: Secondary | ICD-10-CM

## 2014-12-22 MED ORDER — NA SULFATE-K SULFATE-MG SULF 17.5-3.13-1.6 GM/177ML PO SOLN: ORAL | Status: DC

## 2014-12-22 NOTE — Progress Notes (Signed)
Patient denies any allergies to eggs or soy. Patient denies any problems with anesthesia/sedation. Patient denies any oxygen use at home and does not take any diet/weight loss medications. EMMI education assisgned to patient on colonoscopy, this was explained and instructions given to patient. 

## 2014-12-30 ENCOUNTER — Encounter: Payer: Self-pay | Admitting: Gastroenterology

## 2015-01-12 ENCOUNTER — Ambulatory Visit (AMBULATORY_SURGERY_CENTER): Payer: Medicare PPO | Admitting: Gastroenterology

## 2015-01-12 ENCOUNTER — Encounter: Payer: Self-pay | Admitting: Gastroenterology

## 2015-01-12 VITALS — BP 101/72 | HR 74 | Temp 97.9°F | Resp 16 | Ht 64.0 in | Wt 191.0 lb

## 2015-01-12 DIAGNOSIS — D122 Benign neoplasm of ascending colon: Secondary | ICD-10-CM | POA: Diagnosis not present

## 2015-01-12 DIAGNOSIS — Z86010 Personal history of colonic polyps: Secondary | ICD-10-CM

## 2015-01-12 MED ORDER — SODIUM CHLORIDE 0.9 % IV SOLN: 500.0000 mL | INTRAVENOUS | Status: DC

## 2015-01-12 NOTE — Progress Notes (Signed)
Called to room to assist during endoscopic procedure.  Patient ID and intended procedure confirmed with present staff. Received instructions for my participation in the procedure from the performing physician.  

## 2015-01-12 NOTE — Op Note (Signed)
[facility]  [address] [place], [address]   COLONOSCOPY PROCEDURE REPORT  PATIENT: Rebekah Valentine, Rebekah Valentine  MR#: [id number] BIRTHDATE: 04-11-1946 , 20  yrs. old GENDER: female ENDOSCOPIST: Ladene Artist, MD, [facility] PROCEDURE DATE:  01/12/2015 PROCEDURE:   Colonoscopy, surveillance and Colonoscopy with snare polypectomy First Screening Colonoscopy - Avg.  risk and is 50 yrs.  old or older - No.  Prior Negative Screening - Now for repeat screening. N/A  History of Adenoma - Now for follow-up colonoscopy & has been > or = to 3 yrs.  Yes hx of adenoma.  Has been 3 or more years since last colonoscopy. ASA CLASS:   Class II INDICATIONS:Surveillance due to prior colonic neoplasia and PH Colon Adenoma. MEDICATIONS: Monitored anesthesia care and Propofol 200 mg IV DESCRIPTION OF PROCEDURE:   After the risks benefits and alternatives of the procedure were thoroughly explained, informed consent was obtained.  The digital rectal exam revealed no abnormalities of the rectum.   The LB [id number]  endoscope was introduced through the anus and advanced to the cecum, which was identified by both the appendix and ileocecal valve. No adverse events experienced.   The quality of the prep was excellent. (Suprep was used)  The instrument was then slowly withdrawn as the colon was fully examined.    COLON FINDINGS: A sessile polyp measuring 7 mm in size was found in the ascending colon.  A polypectomy was performed with a cold snare.  The resection was complete, the polyp tissue was completely retrieved and sent to histology.   There was mild diverticulosis noted in the sigmoid colon.   The examination was otherwise normal. Retroflexed views revealed internal Grade I hemorrhoids. The time to cecum = 1.7 Withdrawal time = 11.3   The scope was withdrawn and the procedure completed. COMPLICATIONS: There were no immediate complications.  ENDOSCOPIC IMPRESSION: 1.   Sessile polyp  in the ascending colon; polypectomy performed with a cold snare 2.   Mild diverticulosis in the sigmoid colon 3.   Grade l internal hemorrhoids  RECOMMENDATIONS: 1.  Await pathology results 2.  High fiber diet with liberal fluid intake. 3.  Prep H supp daily as needed 4.  Repeat Colonoscopy in 5 years.  eSigned:  Ladene Artist, MD, [facility] 01/12/2015 8:23 AM

## 2015-01-12 NOTE — Progress Notes (Signed)
A/ox3, pleased with MAC, report to RN 

## 2015-01-12 NOTE — Patient Instructions (Signed)
High fiber diet . Use Prep H as needed.   YOU HAD AN ENDOSCOPIC PROCEDURE TODAY AT [facility]:   Refer to the procedure report that was given to you for any specific questions about what was found during the examination.  If the procedure report does not answer your questions, please call your gastroenterologist to clarify.  If you requested that your care partner not be given the details of your procedure findings, then the procedure report has been included in a sealed envelope for you to review at your convenience later.  YOU SHOULD EXPECT: Some feelings of bloating in the abdomen. Passage of more gas than usual.  Walking can help get rid of the air that was put into your GI tract during the procedure and reduce the bloating. If you had a lower endoscopy (such as a colonoscopy or flexible sigmoidoscopy) you may notice spotting of blood in your stool or on the toilet paper. If you underwent a bowel prep for your procedure, you may not have a normal bowel movement for a few days.  Please Note:  You might notice some irritation and congestion in your nose or some drainage.  This is from the oxygen used during your procedure.  There is no need for concern and it should clear up in a day or so.  SYMPTOMS TO REPORT IMMEDIATELY:   Following lower endoscopy (colonoscopy or flexible sigmoidoscopy):  Excessive amounts of blood in the stool  Significant tenderness or worsening of abdominal pains  Swelling of the abdomen that is new, acute  Fever of 100F or higher   For urgent or emergent issues, a gastroenterologist can be reached at any hour by calling [phone].   DIET: Your first meal following the procedure should be a small meal and then it is ok to progress to your normal diet. Heavy or fried foods are harder to digest and may make you feel nauseous or bloated.  Likewise, meals heavy in dairy and vegetables can increase bloating.  Drink plenty of fluids but you should  avoid alcoholic beverages for 24 hours.  ACTIVITY:  You should plan to take it easy for the rest of today and you should NOT DRIVE or use heavy machinery until tomorrow (because of the sedation medicines used during the test).    FOLLOW UP: Our staff will call the number listed on your records the next business day following your procedure to check on you and address any questions or concerns that you may have regarding the information given to you following your procedure. If we do not reach you, we will leave a message.  However, if you are feeling well and you are not experiencing any problems, there is no need to return our call.  We will assume that you have returned to your regular daily activities without incident.  If any biopsies were taken you will be contacted by phone or by letter within the next 1-3 weeks.  Please call us at [phone] if you have not heard about the biopsies in 3 weeks.    SIGNATURES/CONFIDENTIALITY: You and/or your care partner have signed paperwork which will be entered into your electronic medical record.  These signatures attest to the fact that that the information above on your After Visit Summary has been reviewed and is understood.  Full responsibility of the confidentiality of this discharge information lies with you and/or your care-partner.

## 2015-01-13 ENCOUNTER — Telehealth: Payer: Self-pay | Admitting: *Deleted

## 2015-01-13 NOTE — Telephone Encounter (Signed)

## 2015-01-22 ENCOUNTER — Encounter: Payer: Self-pay | Admitting: Gastroenterology

## 2015-02-17 ENCOUNTER — Other Ambulatory Visit: Payer: Self-pay | Admitting: Internal Medicine

## 2015-02-23 ENCOUNTER — Other Ambulatory Visit: Payer: Self-pay | Admitting: Family

## 2015-05-14 DIAGNOSIS — M1711 Unilateral primary osteoarthritis, right knee: Secondary | ICD-10-CM | POA: Diagnosis not present

## 2015-05-26 ENCOUNTER — Other Ambulatory Visit: Payer: Self-pay | Admitting: Internal Medicine

## 2015-05-28 NOTE — Telephone Encounter (Signed)
  Refill sent. Pt is due for follow up please.

## 2015-05-29 NOTE — Telephone Encounter (Signed)
Please call pt to arrange fasting f/u with [name], NP.

## 2015-06-02 ENCOUNTER — Telehealth: Payer: Self-pay | Admitting: Family

## 2015-06-02 DIAGNOSIS — E2839 Other primary ovarian failure: Secondary | ICD-10-CM

## 2015-06-02 NOTE — Telephone Encounter (Signed)
Spouse states patient will call to schedule follow up

## 2015-06-02 NOTE — Telephone Encounter (Signed)
Caller name: Burma Ketcher Relation to pt: self Call back number: [phone] Pharmacy:  Reason for call: Pt called stating needs a referral for Bone Density since she has an appt  at the [facility] off of [facility]. on June 15, 2015. Please advise.

## 2015-06-03 NOTE — Telephone Encounter (Signed)
Placed dexa order.

## 2015-06-05 ENCOUNTER — Ambulatory Visit: Payer: Medicare PPO | Admitting: Family

## 2015-06-15 ENCOUNTER — Encounter: Payer: Self-pay | Admitting: Family

## 2015-06-15 ENCOUNTER — Ambulatory Visit (INDEPENDENT_AMBULATORY_CARE_PROVIDER_SITE_OTHER): Payer: Medicare PPO | Admitting: Family

## 2015-06-15 VITALS — BP 120/82 | HR 85 | Temp 98.5°F | Resp 16 | Ht 63.25 in | Wt 188.8 lb

## 2015-06-15 DIAGNOSIS — IMO0001 Reserved for inherently not codable concepts without codable children: Secondary | ICD-10-CM

## 2015-06-15 DIAGNOSIS — E039 Hypothyroidism, unspecified: Secondary | ICD-10-CM

## 2015-06-15 DIAGNOSIS — G4700 Insomnia, unspecified: Secondary | ICD-10-CM | POA: Diagnosis not present

## 2015-06-15 DIAGNOSIS — K219 Gastro-esophageal reflux disease without esophagitis: Secondary | ICD-10-CM

## 2015-06-15 DIAGNOSIS — R030 Elevated blood-pressure reading, without diagnosis of hypertension: Secondary | ICD-10-CM | POA: Diagnosis not present

## 2015-06-15 DIAGNOSIS — E079 Disorder of thyroid, unspecified: Secondary | ICD-10-CM | POA: Diagnosis not present

## 2015-06-15 LAB — TSH: TSH: 2.7 u[IU]/mL (ref 0.35–4.50)

## 2015-06-15 LAB — HM DEXA SCAN: HM Dexa Scan: NORMAL

## 2015-06-15 MED ORDER — OMEPRAZOLE MAGNESIUM 20.6 (20 BASE) MG PO CPDR
1.0000 | DELAYED_RELEASE_CAPSULE | Freq: Every day | ORAL | Status: DC
Start: 2015-06-15 — End: 2015-06-26

## 2015-06-15 NOTE — Assessment & Plan Note (Signed)
BP stable.

## 2015-06-15 NOTE — Assessment & Plan Note (Signed)
Stable on PPI, continue same.  

## 2015-06-15 NOTE — Progress Notes (Signed)
Subjective:    Patient ID: Rebekah Valentine, female    DOB: 1946/02/04, 69 y.o.   MRN: [id number]  HPI   Rebekah Valentine is a 69 yr old female who presents today for follow up.  1) Hypothyroid- she is maintained on synthroid 25 mcg. Reports feeling well on current dose.    Lab Results  Component Value Date   TSH 3.00 10/13/2014    2) Hx of elevated BP-   BP Readings from Last 3 Encounters:  06/15/15 120/82  01/12/15 101/72  11/24/14 136/88   3) GERD-  She continues omeprazole OTC, requests rx for cost purposes.  She reports GERD symptoms are well controlled.  4) Insomnia- continues HS amitryptiline. Reports that this continues to work well for her.   Review of Systems See HPI  Past Medical History  Diagnosis Date  . COLONIC POLYPS, HX OF 11/2007  . SKIN CANCER, HX OF   . INSOMNIA   . HEMORRHOIDS   . TROCHANTERIC BURSITIS, RIGHT   . HYPERTENSION   . Breast cyst     recurrent    Social History   Social History  . Marital Status: Married    Spouse Name: N/A  . Number of Children: N/A  . Years of Education: N/A   Occupational History  . Not on file.   Social History Main Topics  . Smoking status: Never Smoker   . Smokeless tobacco: Never Used     Comment: Married, lives with spouse. Retired asst. teacher-subs now [employer]  . Alcohol Use: 0.6 - 1.2 oz/week    1-2 Glasses of wine per week     Comment: occassional glass of wine  . Drug Use: No  . Sexual Activity: No   Other Topics Concern  . Not on file   Social History Narrative    Past Surgical History  Procedure Laterality Date  . Colon polpectomy  11/2007    Dr. Fuller Plan  . Lumpectomy Right 1990    breast  . Tonsillectomy  1957  . Hysteroscopy      polyp  . Tonsillectomy and adenoidectomy      Family History  Problem Relation Age of Onset  . Arthritis Father   . Coronary artery disease Father   . Hypertension Father   . Heart disease Father   . Breast cancer Paternal Aunt   . Diabetes Paternal  Grandmother   . COPD Sister     smoker  . Blindness Sister     since birth  . Congestive Heart Failure Sister   . COPD Mother     smoker  . Colon cancer Neg Hx     No Known Allergies  Current Outpatient Prescriptions on File Prior to Visit  Medication Sig Dispense Refill  . amitriptyline (ELAVIL) 10 MG tablet TAKE 1 TO 2 TABLETS BY MOUTH EVERY NIGHT AT BEDTIME AS NEEDED FOR SLEEP 180 tablet 1  . Calcium Carbonate-Vit D-Min (CALCIUM 1200 PO) Take 1 tablet by mouth every morning. 600mg  calcium and 500mg  vitamin D each tablet    . Cholecalciferol (VITAMIN D3) 2000 UNITS TABS Take 1 capsule by mouth daily.    . Cyanocobalamin (VITAMIN B-12 CR PO) Take by mouth.    . cycloSPORINE (RESTASIS) 0.05 % ophthalmic emulsion 1 drop every 12 (twelve) hours.      Marland Kitchen levothyroxine (SYNTHROID, LEVOTHROID) 25 MCG tablet TAKE 1 TABLET(25 MCG) BY MOUTH DAILY 90 tablet 0  . Magnesium 250 MG TABS Take by mouth 3 (three) times a  week. Mon, Wed, and Fri     . Misc Natural Products (OSTEO BI-FLEX JOINT SHIELD) TABS Take by mouth daily.      . Multiple Vitamins-Minerals (ONE-A-DAY EXTRAS ANTIOXIDANT) CAPS Take by mouth daily.     . Omeprazole Magnesium 20.6 (20 BASE) MG CPDR Take 1 capsule by mouth daily.     No current facility-administered medications on file prior to visit.    BP 120/82 mmHg  Pulse 85  Temp(Src) 98.5 F (36.9 C) (Oral)  Resp 16  Ht 5' 3.25" (1.607 m)  Wt 188 lb 12.8 oz (85.639 kg)  BMI 33.16 kg/m2  SpO2 97%       Objective:   Physical Exam  Constitutional: She is oriented to person, place, and time. She appears well-developed and well-nourished.  Neck: No thyromegaly present.  Cardiovascular: Normal rate, regular rhythm and normal heart sounds.   No murmur heard. Pulmonary/Chest: Effort normal and breath sounds normal. No respiratory distress. She has no wheezes.  Neurological: She is alert and oriented to person, place, and time.  Skin: Skin is warm and dry.  Psychiatric:  She has a normal mood and affect. Her behavior is normal. Judgment and thought content normal.          Assessment & Plan:

## 2015-06-15 NOTE — Patient Instructions (Addendum)
Please complete lab work prior to leaving. Follow up in the end of March for your Medicare Wellness visit.

## 2015-06-15 NOTE — Assessment & Plan Note (Signed)
Clinically stable on current dose of synthroid, obtain TSH.

## 2015-06-15 NOTE — Assessment & Plan Note (Signed)
Stable on elavil, continue same.

## 2015-06-15 NOTE — Progress Notes (Signed)
Pre visit review using our clinic review tool, if applicable. No additional management support is needed unless otherwise documented below in the visit note. 

## 2015-06-24 ENCOUNTER — Encounter: Payer: Self-pay | Admitting: Family

## 2015-06-25 ENCOUNTER — Telehealth: Payer: Self-pay | Admitting: *Deleted

## 2015-06-25 DIAGNOSIS — H2513 Age-related nuclear cataract, bilateral: Secondary | ICD-10-CM | POA: Diagnosis not present

## 2015-06-25 NOTE — Telephone Encounter (Signed)
PA for omeprazole magnesium 20.6 mg capsules initiated through [facility]. Awaiting determination. JG//CMA

## 2015-06-26 ENCOUNTER — Telehealth: Payer: Self-pay | Admitting: *Deleted

## 2015-06-26 NOTE — Telephone Encounter (Signed)
PA denied by [facility]. They state it is denied because medication is available over the counter. JG//CMA

## 2015-06-26 NOTE — Telephone Encounter (Signed)
Received call from pharmacist at [facility] stating insurance doesn't want to cover otc omeprazole and is request verbal to dispense Rx. Verbal given and med list updated.

## 2015-06-29 ENCOUNTER — Encounter: Payer: Self-pay | Admitting: Family

## 2015-07-14 ENCOUNTER — Encounter: Payer: Self-pay | Admitting: Family

## 2015-07-14 DIAGNOSIS — M1711 Unilateral primary osteoarthritis, right knee: Secondary | ICD-10-CM | POA: Diagnosis not present

## 2015-07-29 DIAGNOSIS — M1711 Unilateral primary osteoarthritis, right knee: Secondary | ICD-10-CM | POA: Diagnosis not present

## 2015-08-05 DIAGNOSIS — M1711 Unilateral primary osteoarthritis, right knee: Secondary | ICD-10-CM | POA: Diagnosis not present

## 2015-08-12 DIAGNOSIS — M1711 Unilateral primary osteoarthritis, right knee: Secondary | ICD-10-CM | POA: Diagnosis not present

## 2015-09-05 ENCOUNTER — Other Ambulatory Visit: Payer: Self-pay | Admitting: Family

## 2015-09-24 ENCOUNTER — Other Ambulatory Visit: Payer: Self-pay | Admitting: Family

## 2015-10-05 ENCOUNTER — Telehealth: Payer: Self-pay | Admitting: Family

## 2015-10-05 NOTE — Telephone Encounter (Signed)
Relation to [id number] Call back number:[phone]   Reason for call:  Patient scheduled for 12/15/2015 for medicare wellness as per 06/15/2015 AVS: Follow up in the end of March for your Medicare Wellness visit.

## 2015-10-07 NOTE — Telephone Encounter (Signed)
Noted  

## 2015-11-24 ENCOUNTER — Ambulatory Visit: Payer: Medicare PPO

## 2015-12-04 ENCOUNTER — Other Ambulatory Visit: Payer: Self-pay | Admitting: Family

## 2015-12-15 ENCOUNTER — Ambulatory Visit (INDEPENDENT_AMBULATORY_CARE_PROVIDER_SITE_OTHER): Payer: Medicare Other

## 2015-12-15 VITALS — BP 150/80 | HR 89 | Ht 63.0 in | Wt 181.4 lb

## 2015-12-15 DIAGNOSIS — Z1159 Encounter for screening for other viral diseases: Secondary | ICD-10-CM

## 2015-12-15 DIAGNOSIS — Z0000 Encounter for general adult medical examination without abnormal findings: Secondary | ICD-10-CM | POA: Diagnosis not present

## 2015-12-15 NOTE — Progress Notes (Signed)
Pre visit review using our clinic review tool, if applicable. No additional management support is needed unless otherwise documented below in the visit note. 

## 2015-12-15 NOTE — Progress Notes (Signed)
Subjective:   Rebekah Valentine is a 70 y.o. female who presents for Medicare Annual (Subsequent) preventive examination.  Review of Systems: No ROS Cardiac Risk Factors include: advanced age (>23men, >70 women);obesity (BMI >30kg/m2)  Sleep patterns:  Take sleeping aid/sleeps at least 7-8 hours per night/wakes up once during the night to void.  Home Safety/Smoke Alarms:  Feels safe at home.  Lives with husband in one level home.  Smoke alarms present.  Security system and dog present in home.   Firearm Safety:  Kept in safe place.   Seat Belt Safety/Bike Helmet:  Always wears seat belt.    Counseling:   Eye Exam- Goes annually.   Dental- Goes every 6 months.  Dr. Laurelyn Sickle Female:  Mammo-11/21/14-negative      Dexa scan-06/15/15-takes calcium and vitamin D    CCS-01/12/15; repeat in 5 years; Dr. Fuller Plan.     Objective:     Vitals: BP 150/80 mmHg  Pulse 89  Ht 5\' 3"  (1.6 m)  Wt 181 lb 6.4 oz (82.283 kg)  BMI 32.14 kg/m2  SpO2 97%  Body mass index is 32.14 kg/(m^2).   Tobacco History  Smoking status  . Never Smoker   Smokeless tobacco  . Never Used    Comment: Married, lives with spouse. Retired asst. teacher-subs now K-5     Counseling given: No   Past Medical History  Diagnosis Date  . COLONIC POLYPS, HX OF 11/2007  . SKIN CANCER, HX OF   . INSOMNIA   . HEMORRHOIDS   . TROCHANTERIC BURSITIS, RIGHT   . HYPERTENSION   . Breast cyst     recurrent  . Knee pain     Arthritis    Past Surgical History  Procedure Laterality Date  . Colon polpectomy  11/2007    Dr. Fuller Plan  . Lumpectomy Right 1990    breast  . Tonsillectomy  1957  . Hysteroscopy      polyp  . Tonsillectomy and adenoidectomy     Family History  Problem Relation Age of Onset  . Arthritis Father   . Coronary artery disease Father   . Hypertension Father   . Heart disease Father   . Breast cancer Paternal Aunt   . Diabetes Paternal Grandmother   . COPD Sister     smoker  . Blindness Sister    since birth  . Congestive Heart Failure Sister   . COPD Mother     smoker  . Colon cancer Neg Hx    History  Sexual Activity  . Sexual Activity: No    Outpatient Encounter Prescriptions as of 12/15/2015  Medication Sig  . amitriptyline (ELAVIL) 10 MG tablet TAKE 1 TO 2 TABLETS BY MOUTH EVERY NIGHT AT BEDTIME AS NEEDED FOR SLEEP  . Calcium Carbonate-Vit D-Min (CALCIUM 1200 PO) Take 1 tablet by mouth every morning. 600mg  calcium and 500mg  vitamin D each tablet  . Cholecalciferol (VITAMIN D3) 2000 UNITS TABS Take 1 capsule by mouth daily.  . Cyanocobalamin (VITAMIN B-12 CR PO) Take by mouth.  . cycloSPORINE (RESTASIS) 0.05 % ophthalmic emulsion 1 drop every 12 (twelve) hours.    Marland Kitchen levothyroxine (SYNTHROID, LEVOTHROID) 25 MCG tablet TAKE 1 TABLET(25 MCG) BY MOUTH DAILY  . Magnesium 250 MG TABS Take by mouth 3 (three) times a week. Mon, Wed, and Fri   . Misc Natural Products (OSTEO BI-FLEX JOINT SHIELD) TABS Take by mouth daily.    . Multiple Vitamins-Minerals (ONE-A-DAY EXTRAS ANTIOXIDANT) CAPS Take by mouth daily.   Marland Kitchen  omeprazole (PRILOSEC) 20 MG capsule Take 20 mg by mouth daily.   No facility-administered encounter medications on file as of 12/15/2015.    Activities of Daily Living In your present state of health, do you have any difficulty performing the following activities: 12/15/2015  Hearing? N  Vision? N  Difficulty concentrating or making decisions? N  Walking or climbing stairs? N  Dressing or bathing? N  Doing errands, shopping? N  Preparing Food and eating ? N  Using the Toilet? N  In the past six months, have you accidently leaked urine? N  Do you have problems with loss of bowel control? N  Managing your Medications? N  Managing your Finances? N  Housekeeping or managing your Housekeeping? N    Patient Care Team: [name], NP as PCP - General (Internal Medicine) Ladene Artist, MD as Consulting Physician (Gastroenterology) Danella Sensing, MD as  Consulting Physician (Dermatology)  Dr. Fontaine No    Assessment:  Deferred to PCP.    Exercise Activities and Dietary recommendations Current Exercise Habits: Home exercise routine, Type of exercise: walking, Time (Minutes): 35, Frequency (Times/Week): 3, Weekly Exercise (Minutes/Week): 105   Diet:  Regular diet.  Mostly cooked food.  3 meals per day.  Eats fruits and vegetables.  Loves desserts.    Goals    . Lose weight     Lose 20 lbs.   Portion control and exercise.        Fall Risk Fall Risk  12/15/2015 11/24/2014 10/13/2014 10/09/2013 10/08/2012  Falls in the past year? Yes Yes No No No  Number falls in past yr: 1 1 - - -  Injury with Fall? No No - - -  Risk for fall due to : - Other (Comment) - - -  Follow up Education provided;Falls prevention discussed Education provided - - -   Depression Screen PHQ 2/9 Scores 12/15/2015 11/24/2014 10/13/2014 10/09/2013  PHQ - 2 Score 0 0 0 0     Cognitive Testing MMSE - Mini Mental State Exam 12/15/2015  Orientation to time 5  Orientation to Place 5  Registration 3  Attention/ Calculation 5  Recall 3  Language- name 2 objects 2  Language- repeat 1  Language- follow 3 step command 3  Language- read & follow direction 1  Write a sentence 1  Copy design 1  Total score 30    Immunization History  Administered Date(s) Administered  . Influenza Split 07/10/2012, 06/19/2014  . Influenza Whole 10/19/2009, 07/06/2010  . Influenza, High Dose Seasonal PF 06/19/2013  . Influenza-Unspecified 06/19/2014, 06/02/2015  . Pneumococcal Conjugate-13 10/13/2014  . Pneumococcal Polysaccharide-23 12/30/2008  . Td 03/02/2002  . Tdap 03/21/2011  . Zoster 10/25/2007   Screening Tests Health Maintenance  Topic Date Due  . Hepatitis C Screening  Mar 08, 1946  . PNA vac Low Risk Adult (2 of 2 - PPSV23) 10/14/2015  . INFLUENZA VACCINE  04/19/2016  . MAMMOGRAM  11/20/2016  . COLONOSCOPY  01/12/2020  . TETANUS/TDAP  03/20/2021  . DEXA SCAN   Completed  . ZOSTAVAX  Addressed      Plan:  Go to lab for Hepatitis C screening.  Continue doing brain stimulating activities (puzzles, reading, adult coloring books, staying active) to keep memory sharp.   Eat heart healthy diet (full of fruits, vegetables, whole grains, lean protein, water--limit salt, fat, and sugar intake)   Watch portion sizes and be sure to read labels.   Blood pressure was elevated today.  Increase water intake.  Limit salt  intake.  Recheck BP once you get home.  Call back if blood pressure is still elevated.     Increase physical activity as tolerated.  Walking is a great way to exercise.  Continue walking your dogs.   Call insurance company to ensure coverage of Pneumonia 23 vaccine.    Follow up with [name] for CPE in May.      During the course of the visit the patient was educated and counseled about the following appropriate screening and preventive services:   Vaccines to include Pneumococcal, Influenza, Hepatitis B, Td, Zostavax, HCV  Electrocardiogram  Cardiovascular Disease  Colorectal cancer screening  Bone density screening  Diabetes screening  Glaucoma screening  Mammography/PAP  Nutrition counseling   Patient Instructions (the written plan) was given to the patient.   Rudene Anda, RN  12/15/2015

## 2015-12-15 NOTE — Patient Instructions (Addendum)
Go to lab for Hepatitis C screening.  Continue doing brain stimulating activities (puzzles, reading, adult coloring books, staying active) to keep memory sharp.   Eat heart healthy diet (full of fruits, vegetables, whole grains, lean protein, water--limit salt, fat, and sugar intake)   Watch portion sizes and be sure to read labels.   Blood pressure was elevated today.  Increase water intake.  Limit salt intake.  Recheck BP once you get home.  Call back if blood pressure is still elevated.     Increase physical activity as tolerated.  Walking is a great way to exercise.  Continue walking your dogs.   Call insurance company to ensure coverage of Pneumonia 23 vaccine.   Follow up with [name] for CPE in May.    Fat and Cholesterol Restricted Diet Getting too much fat and cholesterol in your diet may cause health problems. Following this diet helps keep your fat and cholesterol at normal levels. This can keep you from getting sick. WHAT TYPES OF FAT SHOULD I CHOOSE?  Choose monosaturated and polyunsaturated fats. These are found in foods such as olive oil, canola oil, flaxseeds, walnuts, almonds, and seeds.  Eat more omega-3 fats. Good choices include salmon, mackerel, sardines, tuna, flaxseed oil, and ground flaxseeds.  Limit saturated fats. These are in animal products such as meats, butter, and cream. They can also be in plant products such as palm oil, palm kernel oil, and coconut oil.   Avoid foods with partially hydrogenated oils in them. These contain trans fats. Examples of foods that have trans fats are stick margarine, some tub margarines, cookies, crackers, and other baked goods. WHAT GENERAL GUIDELINES DO I NEED TO FOLLOW?   Check food labels. Look for the words "trans fat" and "saturated fat."  When preparing a meal:  Fill half of your plate with vegetables and green salads.  Fill one fourth of your plate with whole grains. Look for the word "whole" as the first  word in the ingredient list.  Fill one fourth of your plate with lean protein foods.  Limit fruit to two servings a day. Choose fruit instead of juice.  Eat more foods with soluble fiber. Examples of foods with this type of fiber are apples, broccoli, carrots, beans, peas, and barley. Try to get 20-30 g (grams) of fiber per day.  Eat more home-cooked foods. Eat less at restaurants and buffets.  Limit or avoid alcohol.  Limit foods high in starch and sugar.  Limit fried foods.  Cook foods without frying them. Baking, boiling, grilling, and broiling are all great options.  Lose weight if you are overweight. Losing even a small amount of weight can help your overall health. It can also help prevent diseases such as diabetes and heart disease. WHAT FOODS CAN I EAT? Grains Whole grains, such as whole wheat or whole grain breads, crackers, cereals, and pasta. Unsweetened oatmeal, bulgur, barley, quinoa, or brown rice. Corn or whole wheat flour tortillas. Vegetables Fresh or frozen vegetables (raw, steamed, roasted, or grilled). Green salads. Fruits All fresh, canned (in natural juice), or frozen fruits. Meat and Other Protein Products Ground beef (85% or leaner), grass-fed beef, or beef trimmed of fat. Skinless chicken or [place]. Ground chicken or [place]. Pork trimmed of fat. All fish and seafood. Eggs. Dried beans, peas, or lentils. Unsalted nuts or seeds. Unsalted canned or dry beans. Dairy Low-fat dairy products, such as skim or 1% milk, 2% or reduced-fat cheeses, low-fat ricotta or cottage cheese, or plain  low-fat yogurt. Fats and Oils Tub margarines without trans fats. Light or reduced-fat mayonnaise and salad dressings. Avocado. Olive, canola, sesame, or safflower oils. Natural peanut or almond butter (choose ones without added sugar and oil). The items listed above may not be a complete list of recommended foods or beverages. Contact your dietitian for more options. WHAT FOODS ARE  NOT RECOMMENDED? Grains White bread. White pasta. White rice. Cornbread. Bagels, pastries, and croissants. Crackers that contain trans fat. Vegetables White potatoes. Corn. Creamed or fried vegetables. Vegetables in a cheese sauce. Fruits Dried fruits. Canned fruit in light or heavy syrup. Fruit juice. Meat and Other Protein Products Fatty cuts of meat. Ribs, chicken wings, bacon, sausage, bologna, salami, chitterlings, fatback, hot dogs, bratwurst, and packaged luncheon meats. Liver and organ meats. Dairy Whole or 2% milk, cream, half-and-half, and cream cheese. Whole milk cheeses. Whole-fat or sweetened yogurt. Full-fat cheeses. Nondairy creamers and whipped toppings. Processed cheese, cheese spreads, or cheese curds. Sweets and Desserts Corn syrup, sugars, honey, and molasses. Candy. Jam and jelly. Syrup. Sweetened cereals. Cookies, pies, cakes, donuts, muffins, and ice cream. Fats and Oils Butter, stick margarine, lard, shortening, ghee, or bacon fat. Coconut, palm kernel, or palm oils. Beverages Alcohol. Sweetened drinks (such as sodas, lemonade, and fruit drinks or punches). The items listed above may not be a complete list of foods and beverages to avoid. Contact your dietitian for more information.   This information is not intended to replace advice given to you by your health care provider. Make sure you discuss any questions you have with your health care provider.   Document Released: 03/06/2012 Document Revised: 09/26/2014 Document Reviewed: 12/05/2013 Elsevier Interactive Patient Education 2016 [place] in the Home  Falls can cause injuries. They can happen to people of all ages. There are many things you can do to make your home safe and to help prevent falls.  WHAT CAN I DO ON THE OUTSIDE OF MY HOME?  Regularly fix the edges of walkways and driveways and fix any cracks.  Remove anything that might make you trip as you walk through a door, such as a  raised step or threshold.  Trim any bushes or trees on the path to your home.  Use bright outdoor lighting.  Clear any walking paths of anything that might make someone trip, such as rocks or tools.  Regularly check to see if handrails are loose or broken. Make sure that both sides of any steps have handrails.  Any raised decks and porches should have guardrails on the edges.  Have any leaves, snow, or ice cleared regularly.  Use sand or salt on walking paths during winter.  Clean up any spills in your garage right away. This includes oil or grease spills. WHAT CAN I DO IN THE BATHROOM?   Use night lights.  Install grab bars by the toilet and in the tub and shower. Do not use towel bars as grab bars.  Use non-skid mats or decals in the tub or shower.  If you need to sit down in the shower, use a plastic, non-slip stool.  Keep the floor dry. Clean up any water that spills on the floor as soon as it happens.  Remove soap buildup in the tub or shower regularly.  Attach bath mats securely with double-sided non-slip rug tape.  Do not have throw rugs and other things on the floor that can make you trip. WHAT CAN I DO IN THE BEDROOM?  Use night  lights.  Make sure that you have a light by your bed that is easy to reach.  Do not use any sheets or blankets that are too big for your bed. They should not hang down onto the floor.  Have a firm chair that has side arms. You can use this for support while you get dressed.  Do not have throw rugs and other things on the floor that can make you trip. WHAT CAN I DO IN THE KITCHEN?  Clean up any spills right away.  Avoid walking on wet floors.  Keep items that you use a lot in easy-to-reach places.  If you need to reach something above you, use a strong step stool that has a grab bar.  Keep electrical cords out of the way.  Do not use floor polish or wax that makes floors slippery. If you must use wax, use non-skid floor  wax.  Do not have throw rugs and other things on the floor that can make you trip. WHAT CAN I DO WITH MY STAIRS?  Do not leave any items on the stairs.  Make sure that there are handrails on both sides of the stairs and use them. Fix handrails that are broken or loose. Make sure that handrails are as long as the stairways.  Check any carpeting to make sure that it is firmly attached to the stairs. Fix any carpet that is loose or worn.  Avoid having throw rugs at the top or bottom of the stairs. If you do have throw rugs, attach them to the floor with carpet tape.  Make sure that you have a light switch at the top of the stairs and the bottom of the stairs. If you do not have them, ask someone to add them for you. WHAT ELSE CAN I DO TO HELP PREVENT FALLS?  Wear shoes that:  Do not have high heels.  Have rubber bottoms.  Are comfortable and fit you well.  Are closed at the toe. Do not wear sandals.  If you use a stepladder:  Make sure that it is fully opened. Do not climb a closed stepladder.  Make sure that both sides of the stepladder are locked into place.  Ask someone to hold it for you, if possible.  Clearly mark and make sure that you can see:  Any grab bars or handrails.  First and last steps.  Where the edge of each step is.  Use tools that help you move around (mobility aids) if they are needed. These include:  Canes.  Walkers.  Scooters.  Crutches.  Turn on the lights when you go into a dark area. Replace any light bulbs as soon as they burn out.  Set up your furniture so you have a clear path. Avoid moving your furniture around.  If any of your floors are uneven, fix them.  If there are any pets around you, be aware of where they are.  Review your medicines with your doctor. Some medicines can make you feel dizzy. This can increase your chance of falling. Ask your doctor what other things that you can do to help prevent falls.   This information  is not intended to replace advice given to you by your health care provider. Make sure you discuss any questions you have with your health care provider.   Document Released: 07/02/2009 Document Revised: 01/20/2015 Document Reviewed: 10/10/2014 Elsevier Interactive Patient Education 2016 [place] Maintenance, Female Adopting a healthy lifestyle and getting preventive  care can go a long way to promote health and wellness. Talk with your health care provider about what schedule of regular examinations is right for you. This is a good chance for you to check in with your provider about disease prevention and staying healthy. In between checkups, there are plenty of things you can do on your own. Experts have done a lot of research about which lifestyle changes and preventive measures are most likely to keep you healthy. Ask your health care provider for more information. WEIGHT AND DIET  Eat a healthy diet  Be sure to include plenty of vegetables, fruits, low-fat dairy products, and lean protein.  Do not eat a lot of foods high in solid fats, added sugars, or salt.  Get regular exercise. This is one of the most important things you can do for your health.  Most adults should exercise for at least 150 minutes each week. The exercise should increase your heart rate and make you sweat (moderate-intensity exercise).  Most adults should also do strengthening exercises at least twice a week. This is in addition to the moderate-intensity exercise.  Maintain a healthy weight  Body mass index (BMI) is a measurement that can be used to identify possible weight problems. It estimates body fat based on height and weight. Your health care provider can help determine your BMI and help you achieve or maintain a healthy weight.  For females 28 years of age and older:   A BMI below 18.5 is considered underweight.  A BMI of 18.5 to 24.9 is normal.  A BMI of 25 to 29.9 is considered  overweight.  A BMI of 30 and above is considered obese.  Watch levels of cholesterol and blood lipids  You should start having your blood tested for lipids and cholesterol at 71 years of age, then have this test every 5 years.  You may need to have your cholesterol levels checked more often if:  Your lipid or cholesterol levels are high.  You are older than 72 years of age.  You are at high risk for heart disease.  CANCER SCREENING   Lung Cancer  Lung cancer screening is recommended for adults 34-62 years old who are at high risk for lung cancer because of a history of smoking.  A yearly low-dose CT scan of the lungs is recommended for people who:  Currently smoke.  Have quit within the past 15 years.  Have at least a 30-pack-year history of smoking. A pack year is smoking an average of one pack of cigarettes a day for 1 year.  Yearly screening should continue until it has been 15 years since you quit.  Yearly screening should stop if you develop a health problem that would prevent you from having lung cancer treatment.  Breast Cancer  Practice breast self-awareness. This means understanding how your breasts normally appear and feel.  It also means doing regular breast self-exams. Let your health care provider know about any changes, no matter how small.  If you are in your 20s or 30s, you should have a clinical breast exam (CBE) by a health care provider every 1-3 years as part of a regular health exam.  If you are 108 or older, have a CBE every year. Also consider having a breast X-ray (mammogram) every year.  If you have a family history of breast cancer, talk to your health care provider about genetic screening.  If you are at high risk for breast cancer, talk to your  health care provider about having an MRI and a mammogram every year.  Breast cancer gene (BRCA) assessment is recommended for women who have family members with BRCA-related cancers. BRCA-related  cancers include:  Breast.  Ovarian.  Tubal.  Peritoneal cancers.  Results of the assessment will determine the need for genetic counseling and BRCA1 and BRCA2 testing. Cervical Cancer Your health care provider may recommend that you be screened regularly for cancer of the pelvic organs (ovaries, uterus, and vagina). This screening involves a pelvic examination, including checking for microscopic changes to the surface of your cervix (Pap test). You may be encouraged to have this screening done every 3 years, beginning at age 48.  For women ages 62-65, health care providers may recommend pelvic exams and Pap testing every 3 years, or they may recommend the Pap and pelvic exam, combined with testing for human papilloma virus (HPV), every 5 years. Some types of HPV increase your risk of cervical cancer. Testing for HPV may also be done on women of any age with unclear Pap test results.  Other health care providers may not recommend any screening for nonpregnant women who are considered low risk for pelvic cancer and who do not have symptoms. Ask your health care provider if a screening pelvic exam is right for you.  If you have had past treatment for cervical cancer or a condition that could lead to cancer, you need Pap tests and screening for cancer for at least 20 years after your treatment. If Pap tests have been discontinued, your risk factors (such as having a new sexual partner) need to be reassessed to determine if screening should resume. Some women have medical problems that increase the chance of getting cervical cancer. In these cases, your health care provider may recommend more frequent screening and Pap tests. Colorectal Cancer  This type of cancer can be detected and often prevented.  Routine colorectal cancer screening usually begins at 71 years of age and continues through 71 years of age.  Your health care provider may recommend screening at an earlier age if you have risk  factors for colon cancer.  Your health care provider may also recommend using home test kits to check for hidden blood in the stool.  A small camera at the end of a tube can be used to examine your colon directly (sigmoidoscopy or colonoscopy). This is done to check for the earliest forms of colorectal cancer.  Routine screening usually begins at age 60.  Direct examination of the colon should be repeated every 5-10 years through 71 years of age. However, you may need to be screened more often if early forms of precancerous polyps or small growths are found. Skin Cancer  Check your skin from head to toe regularly.  Tell your health care provider about any new moles or changes in moles, especially if there is a change in a mole's shape or color.  Also tell your health care provider if you have a mole that is larger than the size of a pencil eraser.  Always use sunscreen. Apply sunscreen liberally and repeatedly throughout the day.  Protect yourself by wearing long sleeves, pants, a wide-brimmed hat, and sunglasses whenever you are outside. HEART DISEASE, DIABETES, AND HIGH BLOOD PRESSURE   High blood pressure causes heart disease and increases the risk of stroke. High blood pressure is more likely to develop in:  People who have blood pressure in the high end of the normal range (130-139/85-89 mm Hg).  People who  are overweight or obese.  People who are African American.  If you are 83-73 years of age, have your blood pressure checked every 3-5 years. If you are 42 years of age or older, have your blood pressure checked every year. You should have your blood pressure measured twice--once when you are at a hospital or clinic, and once when you are not at a hospital or clinic. Record the average of the two measurements. To check your blood pressure when you are not at a hospital or clinic, you can use:  An automated blood pressure machine at a pharmacy.  A home blood pressure  monitor.  If you are between 36 years and 63 years old, ask your health care provider if you should take aspirin to prevent strokes.  Have regular diabetes screenings. This involves taking a blood sample to check your fasting blood sugar level.  If you are at a normal weight and have a low risk for diabetes, have this test once every three years after 71 years of age.  If you are overweight and have a high risk for diabetes, consider being tested at a younger age or more often. PREVENTING INFECTION  Hepatitis B  If you have a higher risk for hepatitis B, you should be screened for this virus. You are considered at high risk for hepatitis B if:  You were born in a country where hepatitis B is common. Ask your health care provider which countries are considered high risk.  Your parents were born in a high-risk country, and you have not been immunized against hepatitis B (hepatitis B vaccine).  You have HIV or AIDS.  You use needles to inject street drugs.  You live with someone who has hepatitis B.  You have had sex with someone who has hepatitis B.  You get hemodialysis treatment.  You take certain medicines for conditions, including cancer, organ transplantation, and autoimmune conditions. Hepatitis C  Blood testing is recommended for:  Everyone born from [birth year] through 1966.  Anyone with known risk factors for hepatitis C. Sexually transmitted infections (STIs)  You should be screened for sexually transmitted infections (STIs) including gonorrhea and chlamydia if:  You are sexually active and are younger than 71 years of age.  You are older than 71 years of age and your health care provider tells you that you are at risk for this type of infection.  Your sexual activity has changed since you were last screened and you are at an increased risk for chlamydia or gonorrhea. Ask your health care provider if you are at risk.  If you do not have HIV, but are at risk, it may be  recommended that you take a prescription medicine daily to prevent HIV infection. This is called pre-exposure prophylaxis (PrEP). You are considered at risk if:  You are sexually active and do not regularly use condoms or know the HIV status of your partner(s).  You take drugs by injection.  You are sexually active with a partner who has HIV. Talk with your health care provider about whether you are at high risk of being infected with HIV. If you choose to begin PrEP, you should first be tested for HIV. You should then be tested every 3 months for as long as you are taking PrEP.  PREGNANCY   If you are premenopausal and you may become pregnant, ask your health care provider about preconception counseling.  If you may become pregnant, take 400 to 800 micrograms (mcg) of folic  acid every day.  If you want to prevent pregnancy, talk to your health care provider about birth control (contraception). OSTEOPOROSIS AND MENOPAUSE   Osteoporosis is a disease in which the bones lose minerals and strength with aging. This can result in serious bone fractures. Your risk for osteoporosis can be identified using a bone density scan.  If you are 65 years of age or older, or if you are at risk for osteoporosis and fractures, ask your health care provider if you should be screened.  Ask your health care provider whether you should take a calcium or vitamin D supplement to lower your risk for osteoporosis.  Menopause may have certain physical symptoms and risks.  Hormone replacement therapy may reduce some of these symptoms and risks. Talk to your health care provider about whether hormone replacement therapy is right for you.  HOME CARE INSTRUCTIONS   Schedule regular health, dental, and eye exams.  Stay current with your immunizations.   Do not use any tobacco products including cigarettes, chewing tobacco, or electronic cigarettes.  If you are pregnant, do not drink alcohol.  If you are  breastfeeding, limit how much and how often you drink alcohol.  Limit alcohol intake to no more than 1 drink per day for nonpregnant women. One drink equals 12 ounces of beer, 5 ounces of wine, or 1 ounces of hard liquor.  Do not use street drugs.  Do not share needles.  Ask your health care provider for help if you need support or information about quitting drugs.  Tell your health care provider if you often feel depressed.  Tell your health care provider if you have ever been abused or do not feel safe at home.   This information is not intended to replace advice given to you by your health care provider. Make sure you discuss any questions you have with your health care provider.   Document Released: 03/21/2011 Document Revised: 09/26/2014 Document Reviewed: 08/07/2013 Elsevier Interactive Patient Education [employer].

## 2015-12-16 ENCOUNTER — Encounter: Payer: Self-pay | Admitting: Family

## 2015-12-16 LAB — HEPATITIS C ANTIBODY: HCV Ab: NEGATIVE

## 2015-12-28 ENCOUNTER — Other Ambulatory Visit: Payer: Self-pay | Admitting: Family

## 2016-01-13 ENCOUNTER — Ambulatory Visit (INDEPENDENT_AMBULATORY_CARE_PROVIDER_SITE_OTHER): Payer: Medicare Other | Admitting: Family

## 2016-01-13 ENCOUNTER — Ambulatory Visit (HOSPITAL_BASED_OUTPATIENT_CLINIC_OR_DEPARTMENT_OTHER)
Admission: RE | Admit: 2016-01-13 | Discharge: 2016-01-13 | Disposition: A | Discharge status: Home / Self Care | Payer: Medicare Other | Source: Ambulatory Visit | Attending: Family | Admitting: Family

## 2016-01-13 ENCOUNTER — Encounter: Payer: Self-pay | Admitting: Family

## 2016-01-13 VITALS — BP 150/88 | HR 80 | Temp 98.4°F | Resp 16 | Ht 63.25 in | Wt 178.2 lb

## 2016-01-13 DIAGNOSIS — I10 Essential (primary) hypertension: Secondary | ICD-10-CM | POA: Diagnosis not present

## 2016-01-13 DIAGNOSIS — Z01818 Encounter for other preprocedural examination: Secondary | ICD-10-CM | POA: Diagnosis not present

## 2016-01-13 LAB — CBC WITH DIFFERENTIAL/PLATELET
BASOS PCT: 0.2 % (ref 0.0–3.0)
Basophils Absolute: 0 10*3/uL (ref 0.0–0.1)
EOS PCT: 1.8 % (ref 0.0–5.0)
Eosinophils Absolute: 0.1 10*3/uL (ref 0.0–0.7)
HCT: 41.7 % (ref 36.0–46.0)
HEMOGLOBIN: 14.1 g/dL (ref 12.0–15.0)
LYMPHS ABS: 1.2 10*3/uL (ref 0.7–4.0)
Lymphocytes Relative: 28 % (ref 12.0–46.0)
MCHC: 33.8 g/dL (ref 30.0–36.0)
MCV: 85.1 fl (ref 78.0–100.0)
MONO ABS: 0.3 10*3/uL (ref 0.1–1.0)
Monocytes Relative: 7.5 % (ref 3.0–12.0)
NEUTROS PCT: 62.5 % (ref 43.0–77.0)
Neutro Abs: 2.6 10*3/uL (ref 1.4–7.7)
Platelets: 280 10*3/uL (ref 150.0–400.0)
RBC: 4.91 Mil/uL (ref 3.87–5.11)
RDW: 13.3 % (ref 11.5–15.5)
WBC: 4.2 10*3/uL (ref 4.0–10.5)

## 2016-01-13 LAB — COMPREHENSIVE METABOLIC PANEL
ALT: 15 U/L (ref 0–35)
AST: 20 U/L (ref 0–37)
Albumin: 4.4 g/dL (ref 3.5–5.2)
Alkaline Phosphatase: 54 U/L (ref 39–117)
BILIRUBIN TOTAL: 0.6 mg/dL (ref 0.2–1.2)
BUN: 21 mg/dL (ref 6–23)
CO2: 31 meq/L (ref 19–32)
CREATININE: 0.78 mg/dL (ref 0.40–1.20)
Calcium: 9.7 mg/dL (ref 8.4–10.5)
Chloride: 104 mEq/L (ref 96–112)
GFR: 77.32 mL/min (ref >60.00)
GLUCOSE: 95 mg/dL (ref 70–99)
Potassium: 4.7 mEq/L (ref 3.5–5.1)
SODIUM: 141 meq/L (ref 135–145)
Total Protein: 6.9 g/dL (ref 6.0–8.3)

## 2016-01-13 LAB — URINALYSIS, ROUTINE W REFLEX MICROSCOPIC
BILIRUBIN URINE: NEGATIVE
HGB URINE DIPSTICK: NEGATIVE
Ketones, ur: NEGATIVE
Nitrite: NEGATIVE
Specific Gravity, Urine: 1.01 (ref 1.000–1.030)
Total Protein, Urine: NEGATIVE
Urine Glucose: NEGATIVE
Urobilinogen, UA: 0.2 (ref 0.0–1.0)
pH: 7.5 (ref 5.0–8.0)

## 2016-01-13 LAB — PROTIME-INR
INR: 1.1 ratio — ABNORMAL HIGH (ref 0.8–1.0)
Prothrombin Time: 11.6 s (ref 9.6–13.1)

## 2016-01-13 MED ORDER — METOPROLOL SUCCINATE ER 25 MG PO TB24: 25.0000 mg | ORAL_TABLET | Freq: Every day | ORAL | Status: DC

## 2016-01-13 NOTE — Assessment & Plan Note (Signed)
BP remains elevated. Will begin toprol 25mg  daily with plan for 1 week follow up BP check.

## 2016-01-13 NOTE — Progress Notes (Signed)
Pre visit review using our clinic review tool, if applicable. No additional management support is needed unless otherwise documented below in the visit note. 

## 2016-01-13 NOTE — Progress Notes (Signed)
Subjective:    Patient ID: Rebekah Valentine, female    DOB: [birth year], 70 y.o.   MRN: [id number]  HPI  70 yr old female with End stage DJD of the right knee presents today for preoperative evaluation for R TKA. Date has not yet been set.    Dr. Ronnie Derby will perform her surgery.  She has lost some weight purposefully. Wt Readings from Last 3 Encounters:  01/13/16 178 lb 3.2 oz (80.831 kg)  12/15/15 181 lb 6.4 oz (82.283 kg)  06/15/15 188 lb 12.8 oz (85.639 kg)   Elevated BP- pt attributes to pain.   BP Readings from Last 3 Encounters:  01/13/16 150/88  12/15/15 150/80  06/15/15 120/82      Review of Systems  Constitutional: Negative for unexpected weight change.  HENT: Negative for hearing loss and rhinorrhea.   Eyes: Negative for visual disturbance.  Respiratory: Negative for cough and shortness of breath.   Cardiovascular: Negative for leg swelling.  Gastrointestinal: Negative for diarrhea, constipation and blood in stool.  Genitourinary: Negative for dysuria, frequency and hematuria.  Musculoskeletal: Positive for arthralgias. Negative for myalgias.  Skin: Negative for rash.  Neurological: Negative for headaches.  Hematological: Negative for adenopathy.  Psychiatric/Behavioral:       Denies depression/anxiety   Past Medical History  Diagnosis Date  . COLONIC POLYPS, HX OF 11/2007  . SKIN CANCER, HX OF   . INSOMNIA   . HEMORRHOIDS   . TROCHANTERIC BURSITIS, RIGHT   . HYPERTENSION   . Breast cyst     recurrent  . Knee pain     Arthritis      Social History   Social History  . Marital Status: Married    Spouse Name: N/A  . Number of Children: N/A  . Years of Education: N/A   Occupational History  . Not on file.   Social History Main Topics  . Smoking status: Never Smoker   . Smokeless tobacco: Never Used     Comment: Married, lives with spouse. Retired asst. teacher-subs now [employer]  . Alcohol Use: 0.6 - 1.2 oz/week    1-2 Glasses of wine per week   Comment: occassional glass of wine  . Drug Use: No  . Sexual Activity: No   Other Topics Concern  . Not on file   Social History Narrative    Past Surgical History  Procedure Laterality Date  . Colon polpectomy  11/2007    Dr. Fuller Plan  . Lumpectomy Right 1990    breast  . Tonsillectomy  1957  . Hysteroscopy      polyp  . Tonsillectomy and adenoidectomy      Family History  Problem Relation Age of Onset  . Arthritis Father   . Coronary artery disease Father   . Hypertension Father   . Heart disease Father   . Breast cancer Paternal Aunt   . Diabetes Paternal Grandmother   . COPD Sister     smoker  . Blindness Sister     since birth  . Congestive Heart Failure Sister   . COPD Mother     smoker  . Colon cancer Neg Hx     No Known Allergies  Current Outpatient Prescriptions on File Prior to Visit  Medication Sig Dispense Refill  . amitriptyline (ELAVIL) 10 MG tablet Take 1-2 tablets (10-20 mg total) by mouth at bedtime as needed for sleep. 30 tablet 0  . Calcium Carbonate-Vit D-Min (CALCIUM 1200 PO) Take 1 tablet by mouth every  morning. 600mg  calcium and 500mg  vitamin D each tablet    . Cholecalciferol (VITAMIN D3) 2000 UNITS TABS Take 1 capsule by mouth daily.    . Cyanocobalamin (VITAMIN B-12 CR PO) Take by mouth 3 (three) times a week.     . cycloSPORINE (RESTASIS) 0.05 % ophthalmic emulsion 1 drop every 12 (twelve) hours.      Marland Kitchen levothyroxine (SYNTHROID, LEVOTHROID) 25 MCG tablet TAKE 1 TABLET(25 MCG) BY MOUTH DAILY 90 tablet 1  . Magnesium 250 MG TABS Take by mouth 3 (three) times a week. Mon, Wed, and Fri     . Misc Natural Products (OSTEO BI-FLEX JOINT SHIELD) TABS Take by mouth daily.      . Multiple Vitamins-Minerals (ONE-A-DAY EXTRAS ANTIOXIDANT) CAPS Take by mouth daily.     Marland Kitchen omeprazole (PRILOSEC) 20 MG capsule Take 20 mg by mouth daily.     No current facility-administered medications on file prior to visit.    BP 150/88 mmHg  Pulse 80  Temp(Src) 98.4  F (36.9 C) (Oral)  Resp 16  Ht 5' 3.25" (1.607 m)  Wt 178 lb 3.2 oz (80.831 kg)  BMI 31.30 kg/m2  SpO2 100%       Objective:   Physical Exam Physical Exam  Constitutional: She is oriented to person, place, and time. She appears well-developed and well-nourished. No distress.  HENT:  Head: Normocephalic and atraumatic.  Right Ear: Tympanic membrane and ear canal normal.  Left Ear: Tympanic membrane and ear canal normal.  Mouth/Throat: Oropharynx is clear and moist.  Eyes: Pupils are equal, round, and reactive to light. No scleral icterus.  Neck: Normal range of motion. No thyromegaly present.  Cardiovascular: Normal rate and regular rhythm.   No murmur heard. Pulmonary/Chest: Effort normal and breath sounds normal. No respiratory distress. He has no wheezes. She has no rales. She exhibits no tenderness.  Abdominal: Soft. Bowel sounds are normal. He exhibits no distension and no mass. There is no tenderness. There is no rebound and no guarding.  Musculoskeletal: She exhibits no edema.  Lymphadenopathy:    She has no cervical adenopathy.  Neurological: She is alert and oriented to person, place, and time.  She exhibits normal muscle tone. Coordination normal.  Skin: Skin is warm and dry.  Psychiatric: She has a normal mood and affect. Her behavior is normal. Judgment and thought content normal.         Assessment & Plan:   EKG is reviewed and compared to EKG 1/15. Shows NSR, appears unchanged compared to previous EKG.        Assessment & Plan:  Pre-operative clearance- obtain labs as below. Advised pt to hold ASA x 7 days pre-op.

## 2016-01-13 NOTE — Patient Instructions (Addendum)
Please complete lab work prior to leaving. Complete chest x ray on the first floor.  Begin toprol for blood pressure. Follow up in 1 month for a bp recheck with RN.  Hold your aspirin for 7 days prior to surgery.

## 2016-01-14 LAB — URINE CULTURE
Colony Count: NO GROWTH
ORGANISM ID, BACTERIA: NO GROWTH

## 2016-01-20 ENCOUNTER — Ambulatory Visit (INDEPENDENT_AMBULATORY_CARE_PROVIDER_SITE_OTHER): Payer: Medicare Other | Admitting: Family

## 2016-01-20 VITALS — BP 143/62 | HR 72

## 2016-01-20 DIAGNOSIS — I10 Essential (primary) hypertension: Secondary | ICD-10-CM

## 2016-01-20 NOTE — Progress Notes (Signed)
Agree 

## 2016-01-20 NOTE — Progress Notes (Signed)
Pre visit review using our clinic review tool, if applicable. No additional management support is needed unless otherwise documented below in the visit note.  Pt reports compliance w/ BP medication and presents w/ daily log of home BPs. She reports that she is anxious about having her BP taken and that it is always higher when she is at the office than when she is home.  01/13/16 OV note: BP remains elevated. Will begin toprol 25mg  daily with plan for 1 week follow up BP check.  Per [name]: Continue current medications. Follow up as scheduled. Pt is cleared for surgery.   Surgical clearance paperwork faxed to Dr. Ruel Favors office. Fax confirmation received.  Dorrene German, RN

## 2016-01-22 ENCOUNTER — Other Ambulatory Visit: Payer: Self-pay | Admitting: Orthopedic Surgery

## 2016-01-25 ENCOUNTER — Encounter: Payer: Medicare Other | Admitting: Family

## 2016-01-26 ENCOUNTER — Encounter: Payer: Self-pay | Admitting: Family

## 2016-01-26 ENCOUNTER — Ambulatory Visit (INDEPENDENT_AMBULATORY_CARE_PROVIDER_SITE_OTHER): Payer: Medicare Other | Admitting: Family

## 2016-01-26 ENCOUNTER — Ambulatory Visit (HOSPITAL_BASED_OUTPATIENT_CLINIC_OR_DEPARTMENT_OTHER)
Admission: RE | Admit: 2016-01-26 | Discharge: 2016-01-26 | Disposition: A | Discharge status: Home / Self Care | Payer: Medicare Other | Source: Ambulatory Visit | Attending: Family | Admitting: Family

## 2016-01-26 VITALS — BP 129/58 | HR 64 | Temp 98.3°F | Ht 63.0 in | Wt 178.6 lb

## 2016-01-26 DIAGNOSIS — Z1239 Encounter for other screening for malignant neoplasm of breast: Secondary | ICD-10-CM

## 2016-01-26 DIAGNOSIS — Z0000 Encounter for general adult medical examination without abnormal findings: Secondary | ICD-10-CM

## 2016-01-26 DIAGNOSIS — E039 Hypothyroidism, unspecified: Secondary | ICD-10-CM

## 2016-01-26 DIAGNOSIS — Z1231 Encounter for screening mammogram for malignant neoplasm of breast: Secondary | ICD-10-CM | POA: Diagnosis not present

## 2016-01-26 LAB — TSH: TSH: 2.39 u[IU]/mL (ref 0.35–4.50)

## 2016-01-26 NOTE — Patient Instructions (Signed)
Please complete lab work prior to leaving. Schedule mammogram on the first floor.  

## 2016-01-26 NOTE — Assessment & Plan Note (Signed)
Continue healthy diet, exercise.  Immunizations reviewed and up to date. Refer for mammogram.

## 2016-01-26 NOTE — Progress Notes (Signed)
Subjective:    Patient ID: Rebekah Valentine, female    DOB: Apr 12, 1946, 70 y.o.   MRN: [id number]  HPI  Patient presents today for complete physical.  Immunizations: up to date.  Diet: reports healthy diet Exercise: walks, uses elliptical Colonoscopy: 4/16 Dexa:9/16 Pap Smear: N/A Mammogram: 4/16    Review of Systems  Constitutional: Negative for unexpected weight change.  HENT: Negative for rhinorrhea.   Eyes: Negative for visual disturbance.  Respiratory: Negative for cough.   Cardiovascular: Negative for leg swelling.  Gastrointestinal: Negative for diarrhea and constipation.  Genitourinary: Negative for dysuria and frequency.  Musculoskeletal:       Right knee pain  Skin:       Had multiple lesions removed by derm yesterday  Neurological: Negative for headaches.  Hematological: Negative for adenopathy.  Psychiatric/Behavioral:       Denies depression/anxiety   Past Medical History  Diagnosis Date  . COLONIC POLYPS, HX OF 11/2007  . SKIN CANCER, HX OF   . INSOMNIA   . HEMORRHOIDS   . TROCHANTERIC BURSITIS, RIGHT   . HYPERTENSION   . Breast cyst     recurrent  . Knee pain     Arthritis      Social History   Social History  . Marital Status: Married    Spouse Name: N/A  . Number of Children: N/A  . Years of Education: N/A   Occupational History  . Not on file.   Social History Main Topics  . Smoking status: Never Smoker   . Smokeless tobacco: Never Used     Comment: Married, lives with spouse. Retired asst. teacher-subs now [employer]  . Alcohol Use: 0.6 - 1.2 oz/week    1-2 Glasses of wine per week     Comment: occassional glass of wine  . Drug Use: No  . Sexual Activity: No   Other Topics Concern  . Not on file   Social History Narrative    Past Surgical History  Procedure Laterality Date  . Colon polpectomy  11/2007    Dr. Fuller Plan  . Lumpectomy Right 1990    breast  . Tonsillectomy  1957  . Hysteroscopy      polyp  . Tonsillectomy and  adenoidectomy      Family History  Problem Relation Age of Onset  . Arthritis Father   . Coronary artery disease Father   . Hypertension Father   . Heart disease Father   . Breast cancer Paternal Aunt   . Diabetes Paternal Grandmother   . COPD Sister     smoker  . Blindness Sister     since birth  . Congestive Heart Failure Sister   . COPD Mother     smoker  . Colon cancer Neg Hx     No Known Allergies  Current Outpatient Prescriptions on File Prior to Visit  Medication Sig Dispense Refill  . amitriptyline (ELAVIL) 10 MG tablet Take 1-2 tablets (10-20 mg total) by mouth at bedtime as needed for sleep. 30 tablet 0  . aspirin EC 81 MG tablet Take 81 mg by mouth daily.    . Calcium Carbonate-Vit D-Min (CALCIUM 1200 PO) Take 1 tablet by mouth every morning. 600mg  calcium and 500mg  vitamin D each tablet    . Cholecalciferol (VITAMIN D3) 2000 UNITS TABS Take 1 capsule by mouth daily.    . Cyanocobalamin (VITAMIN B-12 CR PO) Take by mouth 3 (three) times a week.     . cycloSPORINE (RESTASIS) 0.05 %  ophthalmic emulsion 1 drop every 12 (twelve) hours.      Marland Kitchen levothyroxine (SYNTHROID, LEVOTHROID) 25 MCG tablet TAKE 1 TABLET(25 MCG) BY MOUTH DAILY 90 tablet 1  . Magnesium 250 MG TABS Take by mouth 3 (three) times a week. Mon, Wed, and Fri     . metoprolol succinate (TOPROL-XL) 25 MG 24 hr tablet Take 1 tablet (25 mg total) by mouth daily. 30 tablet 2  . Misc Natural Products (OSTEO BI-FLEX JOINT SHIELD) TABS Take by mouth daily.      . Multiple Vitamins-Minerals (ONE-A-DAY EXTRAS ANTIOXIDANT) CAPS Take by mouth daily.     Marland Kitchen omeprazole (PRILOSEC) 20 MG capsule Take 20 mg by mouth daily.     No current facility-administered medications on file prior to visit.    BP 129/58 mmHg  Pulse 64  Temp(Src) 98.3 F (36.8 C) (Oral)  Ht 5\' 3"  (1.6 m)  Wt 178 lb 9.6 oz (81.012 kg)  BMI 31.65 kg/m2  SpO2 99%       Objective:   Physical Exam  Physical Exam  Constitutional: She is  oriented to person, place, and time. She appears well-developed and well-nourished. No distress.  HENT:  Head: Normocephalic and atraumatic.  Right Ear: Tympanic membrane and ear canal normal.  Left Ear: Tympanic membrane and ear canal normal.  Mouth/Throat: Oropharynx is clear and moist.  Eyes: Pupils are equal, round, and reactive to light. No scleral icterus.  Neck: Normal range of motion. No thyromegaly present.  Cardiovascular: Normal rate and regular rhythm.   No murmur heard. Pulmonary/Chest: Effort normal and breath sounds normal. No respiratory distress. He has no wheezes. She has no rales. She exhibits no tenderness.  Abdominal: Soft. Bowel sounds are normal. He exhibits no distension and no mass. There is no tenderness. There is no rebound and no guarding.  Musculoskeletal: She exhibits no edema.  Lymphadenopathy:    She has no cervical adenopathy.  Neurological: She is alert and oriented to person, place, and time. She has normal reflexes. She exhibits normal muscle tone. Coordination normal.  Skin: Skin is warm and dry.  Psychiatric: She has a normal mood and affect. Her behavior is normal. Judgment and thought content normal.  Breasts: Examined lying Right: Without masses, retractions, discharge or axillary adenopathy.  Left: Without masses, retractions, discharge or axillary adenopathy.  Pelvic: deferred    Assessment & Plan:         Assessment & Plan:

## 2016-01-26 NOTE — Progress Notes (Signed)
Pre visit review using our clinic review tool, if applicable. No additional management support is needed unless otherwise documented below in the visit note. 

## 2016-01-26 NOTE — Addendum Note (Signed)
Addended by: [name] on: 01/26/2016 02:44 PM   Modules accepted: Miquel Dunn

## 2016-01-29 ENCOUNTER — Other Ambulatory Visit: Payer: Self-pay | Admitting: Family

## 2016-02-11 ENCOUNTER — Ambulatory Visit (HOSPITAL_BASED_OUTPATIENT_CLINIC_OR_DEPARTMENT_OTHER)
Admission: RE | Admit: 2016-02-11 | Discharge: 2016-02-11 | Disposition: A | Discharge status: Home / Self Care | Payer: Medicare Other | Source: Ambulatory Visit | Attending: Family | Admitting: Family

## 2016-02-11 ENCOUNTER — Encounter (HOSPITAL_COMMUNITY): Payer: Self-pay

## 2016-02-11 ENCOUNTER — Other Ambulatory Visit (HOSPITAL_COMMUNITY): Payer: Self-pay | Admitting: *Deleted

## 2016-02-11 ENCOUNTER — Encounter (HOSPITAL_COMMUNITY)
Admission: RE | Admit: 2016-02-11 | Discharge: 2016-02-11 | Disposition: A | Discharge status: Home / Self Care | Payer: Medicare Other | Source: Ambulatory Visit | Attending: Orthopedic Surgery | Admitting: Orthopedic Surgery

## 2016-02-11 DIAGNOSIS — Z1231 Encounter for screening mammogram for malignant neoplasm of breast: Secondary | ICD-10-CM | POA: Insufficient documentation

## 2016-02-11 HISTORY — DX: Hypothyroidism, unspecified: E03.9

## 2016-02-11 HISTORY — DX: Unspecified osteoarthritis, unspecified site: M19.90

## 2016-02-11 HISTORY — DX: Gastro-esophageal reflux disease without esophagitis: K21.9

## 2016-02-11 LAB — CBC WITH DIFFERENTIAL/PLATELET
Basophils Absolute: 0 10*3/uL (ref 0.0–0.1)
Basophils Relative: 0 %
EOS ABS: 0.1 10*3/uL (ref 0.0–0.7)
EOS PCT: 4 %
HCT: 42.4 % (ref 36.0–46.0)
Hemoglobin: 14.1 g/dL (ref 12.0–15.0)
LYMPHS ABS: 1.4 10*3/uL (ref 0.7–4.0)
Lymphocytes Relative: 35 %
MCH: 28.3 pg (ref 26.0–34.0)
MCHC: 33.3 g/dL (ref 30.0–36.0)
MCV: 85 fL (ref 78.0–100.0)
MONO ABS: 0.3 10*3/uL (ref 0.1–1.0)
Monocytes Relative: 8 %
Neutro Abs: 2.1 10*3/uL (ref 1.7–7.7)
Neutrophils Relative %: 53 %
PLATELETS: 218 10*3/uL (ref 150–400)
RBC: 4.99 MIL/uL (ref 3.87–5.11)
RDW: 12.8 % (ref 11.5–15.5)
WBC: 3.9 10*3/uL — AB (ref 4.0–10.5)

## 2016-02-11 LAB — COMPREHENSIVE METABOLIC PANEL
ALT: 16 U/L (ref 14–54)
AST: 19 U/L (ref 15–41)
Albumin: 3.9 g/dL (ref 3.5–5.0)
Alkaline Phosphatase: 53 U/L (ref 38–126)
Anion gap: 6 (ref 5–15)
BUN: 21 mg/dL — AB (ref 6–20)
CHLORIDE: 106 mmol/L (ref 101–111)
CO2: 28 mmol/L (ref 22–32)
CREATININE: 0.74 mg/dL (ref 0.44–1.00)
Calcium: 9.4 mg/dL (ref 8.9–10.3)
GFR calc non Af Amer: >60 mL/min (ref >60)
GLUCOSE: 70 mg/dL (ref 65–99)
POTASSIUM: 4.5 mmol/L (ref 3.5–5.1)
SODIUM: 140 mmol/L (ref 135–145)
Total Bilirubin: 0.5 mg/dL (ref 0.3–1.2)
Total Protein: 6.2 g/dL — ABNORMAL LOW (ref 6.5–8.1)

## 2016-02-11 LAB — SURGICAL PCR SCREEN
MRSA, PCR: NEGATIVE
Staphylococcus aureus: NEGATIVE

## 2016-02-11 LAB — TYPE AND SCREEN
ABO/RH(D): A POS
Antibody Screen: NEGATIVE

## 2016-02-11 LAB — URINALYSIS, ROUTINE W REFLEX MICROSCOPIC
Bilirubin Urine: NEGATIVE
GLUCOSE, UA: NEGATIVE mg/dL
Hgb urine dipstick: NEGATIVE
KETONES UR: NEGATIVE mg/dL
LEUKOCYTES UA: NEGATIVE
Nitrite: NEGATIVE
PROTEIN: NEGATIVE mg/dL
Specific Gravity, Urine: 1.006 (ref 1.005–1.030)
pH: 6 (ref 5.0–8.0)

## 2016-02-11 LAB — APTT: aPTT: 30 seconds (ref 24–37)

## 2016-02-11 LAB — ABO/RH: ABO/RH(D): A POS

## 2016-02-11 LAB — PROTIME-INR
INR: 1.04 (ref 0.00–1.49)
Prothrombin Time: 13.8 seconds (ref 11.6–15.2)

## 2016-02-11 NOTE — Pre-Procedure Instructions (Signed)
    Rebekah Valentine  02/11/2016      [id number] - [place], [place] [address] AT [facility] [address] & [address] [place] [address] Phone: [phone] Fax: [phone]    Your procedure is scheduled on Monday, February 22, 2016 at 10:55 AM.  Report to [facility] Entrance "A" Admitting Office at 9:00 AM.  Call this number if you have problems the morning of surgery: [phone]  Any questions prior to day of surgery, please call [phone] between 8 & 4 PM.   Remember:  Do not eat food or drink liquids after midnight Sunday,  02/21/16.  Take these medicines the morning of surgery with A SIP OF WATER: Levothyroxine (Synthroid), Omeprazole (Prilosec)  Stop Aspirin, NSAIDS (Ibuprofen, Naproxen, Aleve, etc.) and Herbal medications 7 days prior to surgery.   Do not wear jewelry, make-up or nail polish.  Do not wear lotions, powders, or perfumes.  You may wear deodorant.  Do not shave 48 hours prior to surgery.    Do not bring valuables to the hospital.  [facility] is not responsible for any belongings or valuables.  Contacts, dentures or bridgework may not be worn into surgery.  Leave your suitcase in the car.  After surgery it may be brought to your room.  For patients admitted to the hospital, discharge time will be determined by your treatment team.  Special instructions:  See "Preparing for Surgery" Instruction sheet.  Please read over the following fact sheets that you were given. Pain Booklet, Coughing and Deep Breathing, MRSA Information and Surgical Site Infection Prevention

## 2016-02-12 LAB — URINE CULTURE

## 2016-02-16 ENCOUNTER — Ambulatory Visit: Payer: Medicare Other | Admitting: Family

## 2016-02-16 NOTE — Progress Notes (Signed)
Abnormal urine culture called to Dr. Jeoffrey Massed office, given to [place].

## 2016-02-19 MED ORDER — SODIUM CHLORIDE 0.9 % IV SOLN: INTRAVENOUS | Status: DC

## 2016-02-19 MED ORDER — CEFAZOLIN SODIUM-DEXTROSE 2-4 GM/100ML-% IV SOLN
2.0000 g | INTRAVENOUS | Status: AC
  Administered 2016-02-22: 2 g via INTRAVENOUS

## 2016-02-19 MED ORDER — TRANEXAMIC ACID 1000 MG/10ML IV SOLN
1000.0000 mg | INTRAVENOUS | Status: AC
  Administered 2016-02-22: 1000 mg via INTRAVENOUS
  Filled 2016-02-19 (×2): qty 10

## 2016-02-19 MED ORDER — ACETAMINOPHEN 500 MG PO TABS
1000.0000 mg | ORAL_TABLET | Freq: Once | ORAL | Status: AC
  Administered 2016-02-22: 1000 mg via ORAL

## 2016-02-22 ENCOUNTER — Inpatient Hospital Stay (HOSPITAL_COMMUNITY): Payer: Medicare Other | Admitting: Certified Registered Nurse Anesthetist

## 2016-02-22 ENCOUNTER — Encounter (HOSPITAL_COMMUNITY)
Admission: RE | Disposition: A | Discharge status: Home Health | Payer: Self-pay | Source: Ambulatory Visit | Attending: Orthopedic Surgery

## 2016-02-22 ENCOUNTER — Encounter (HOSPITAL_COMMUNITY): Payer: Self-pay | Admitting: *Deleted

## 2016-02-22 ENCOUNTER — Inpatient Hospital Stay (HOSPITAL_COMMUNITY)
Admission: RE | Admit: 2016-02-22 | Discharge: 2016-02-23 | DRG: 470 | Disposition: A | Discharge status: Home Health | Payer: Medicare Other | Source: Ambulatory Visit | Attending: Orthopedic Surgery | Admitting: Orthopedic Surgery

## 2016-02-22 DIAGNOSIS — K219 Gastro-esophageal reflux disease without esophagitis: Secondary | ICD-10-CM | POA: Diagnosis present

## 2016-02-22 DIAGNOSIS — Z85828 Personal history of other malignant neoplasm of skin: Secondary | ICD-10-CM | POA: Diagnosis not present

## 2016-02-22 DIAGNOSIS — M25561 Pain in right knee: Secondary | ICD-10-CM | POA: Diagnosis present

## 2016-02-22 DIAGNOSIS — Z96659 Presence of unspecified artificial knee joint: Secondary | ICD-10-CM

## 2016-02-22 DIAGNOSIS — I10 Essential (primary) hypertension: Secondary | ICD-10-CM | POA: Diagnosis present

## 2016-02-22 DIAGNOSIS — M1711 Unilateral primary osteoarthritis, right knee: Secondary | ICD-10-CM | POA: Diagnosis present

## 2016-02-22 DIAGNOSIS — E039 Hypothyroidism, unspecified: Secondary | ICD-10-CM | POA: Diagnosis present

## 2016-02-22 DIAGNOSIS — D62 Acute posthemorrhagic anemia: Secondary | ICD-10-CM | POA: Diagnosis not present

## 2016-02-22 DIAGNOSIS — G4700 Insomnia, unspecified: Secondary | ICD-10-CM | POA: Diagnosis present

## 2016-02-22 HISTORY — PX: TOTAL KNEE ARTHROPLASTY: SHX125

## 2016-02-22 LAB — CBC
HEMATOCRIT: 39.5 % (ref 36.0–46.0)
HEMOGLOBIN: 13 g/dL (ref 12.0–15.0)
MCH: 27.5 pg (ref 26.0–34.0)
MCHC: 32.9 g/dL (ref 30.0–36.0)
MCV: 83.7 fL (ref 78.0–100.0)
Platelets: 220 10*3/uL (ref 150–400)
RBC: 4.72 MIL/uL (ref 3.87–5.11)
RDW: 12.5 % (ref 11.5–15.5)
WBC: 6.7 10*3/uL (ref 4.0–10.5)

## 2016-02-22 LAB — CREATININE, SERUM
Creatinine, Ser: 0.78 mg/dL (ref 0.44–1.00)
GFR calc Af Amer: >60 mL/min (ref >60)
GFR calc non Af Amer: >60 mL/min (ref >60)

## 2016-02-22 SURGERY — ARTHROPLASTY, KNEE, TOTAL
Anesthesia: Spinal | Site: Knee | Laterality: Right

## 2016-02-22 MED ORDER — ACETAMINOPHEN 160 MG/5ML PO SOLN: 325.0000 mg | ORAL | Status: DC | PRN

## 2016-02-22 MED ORDER — LACTATED RINGERS IV SOLN
INTRAVENOUS | Status: DC
  Administered 2016-02-22 (×2): via INTRAVENOUS

## 2016-02-22 MED ORDER — FLEET ENEMA 7-19 GM/118ML RE ENEM: 1.0000 | ENEMA | Freq: Once | RECTAL | Status: DC | PRN

## 2016-02-22 MED ORDER — DIPHENHYDRAMINE HCL 12.5 MG/5ML PO ELIX: 12.5000 mg | ORAL_SOLUTION | ORAL | Status: DC | PRN

## 2016-02-22 MED ORDER — SODIUM CHLORIDE 0.9 % IV SOLN
INTRAVENOUS | Status: DC
  Administered 2016-02-22: 17:00:00 via INTRAVENOUS

## 2016-02-22 MED ORDER — METHOCARBAMOL 500 MG PO TABS
500.0000 mg | ORAL_TABLET | Freq: Four times a day (QID) | ORAL | Status: DC | PRN
Start: 2016-02-22 — End: 2016-02-23
  Administered 2016-02-22 – 2016-02-23 (×2): 500 mg via ORAL
  Filled 2016-02-22: qty 1

## 2016-02-22 MED ORDER — ACETAMINOPHEN 325 MG PO TABS
650.0000 mg | ORAL_TABLET | Freq: Four times a day (QID) | ORAL | Status: DC | PRN
  Administered 2016-02-23: 650 mg via ORAL
  Filled 2016-02-22: qty 2

## 2016-02-22 MED ORDER — PROPOFOL 500 MG/50ML IV EMUL
INTRAVENOUS | Status: DC | PRN
  Administered 2016-02-22: 60 ug/kg/min via INTRAVENOUS

## 2016-02-22 MED ORDER — METHOCARBAMOL 1000 MG/10ML IJ SOLN: 500.0000 mg | Freq: Four times a day (QID) | INTRAVENOUS | Status: DC | PRN

## 2016-02-22 MED ORDER — MIDAZOLAM HCL 5 MG/5ML IJ SOLN
INTRAMUSCULAR | Status: DC | PRN
  Administered 2016-02-22: 2 mg via INTRAVENOUS

## 2016-02-22 MED ORDER — ACETAMINOPHEN 650 MG RE SUPP: 650.0000 mg | Freq: Four times a day (QID) | RECTAL | Status: DC | PRN

## 2016-02-22 MED ORDER — CELECOXIB 200 MG PO CAPS
200.0000 mg | ORAL_CAPSULE | Freq: Two times a day (BID) | ORAL | Status: DC
  Administered 2016-02-22 – 2016-02-23 (×3): 200 mg via ORAL
  Filled 2016-02-22 (×3): qty 1

## 2016-02-22 MED ORDER — HYDROMORPHONE HCL 1 MG/ML IJ SOLN: 1.0000 mg | INTRAMUSCULAR | Status: DC | PRN

## 2016-02-22 MED ORDER — DOCUSATE SODIUM 100 MG PO CAPS
100.0000 mg | ORAL_CAPSULE | Freq: Two times a day (BID) | ORAL | Status: DC
Start: 2016-02-22 — End: 2016-02-23
  Administered 2016-02-22 – 2016-02-23 (×2): 100 mg via ORAL
  Filled 2016-02-22 (×3): qty 1

## 2016-02-22 MED ORDER — FENTANYL CITRATE (PF) 100 MCG/2ML IJ SOLN
INTRAMUSCULAR | Status: DC | PRN
  Administered 2016-02-22 (×2): 25 ug via INTRAVENOUS

## 2016-02-22 MED ORDER — MIDAZOLAM HCL 2 MG/2ML IJ SOLN
INTRAMUSCULAR | Status: AC
  Filled 2016-02-22: qty 2

## 2016-02-22 MED ORDER — TRANEXAMIC ACID 1000 MG/10ML IV SOLN
1000.0000 mg | Freq: Once | INTRAVENOUS | Status: AC
  Administered 2016-02-22: 1000 mg via INTRAVENOUS
  Filled 2016-02-22: qty 10

## 2016-02-22 MED ORDER — BUPIVACAINE LIPOSOME 1.3 % IJ SUSP
INTRAMUSCULAR | Status: DC | PRN
Start: 2016-02-22 — End: 2016-02-22
  Administered 2016-02-22: 20 mL

## 2016-02-22 MED ORDER — HYDROMORPHONE HCL 1 MG/ML IJ SOLN: 0.2500 mg | INTRAMUSCULAR | Status: DC | PRN

## 2016-02-22 MED ORDER — PROPOFOL 1000 MG/100ML IV EMUL
INTRAVENOUS | Status: AC
  Filled 2016-02-22: qty 100

## 2016-02-22 MED ORDER — METHOCARBAMOL 500 MG PO TABS
ORAL_TABLET | ORAL | Status: AC
  Administered 2016-02-22: 500 mg via ORAL
  Filled 2016-02-22: qty 1

## 2016-02-22 MED ORDER — OXYCODONE HCL 5 MG PO TABS
5.0000 mg | ORAL_TABLET | ORAL | Status: DC | PRN
  Administered 2016-02-22 – 2016-02-23 (×4): 10 mg via ORAL
  Filled 2016-02-22 (×4): qty 2

## 2016-02-22 MED ORDER — ACETAMINOPHEN 325 MG PO TABS: 325.0000 mg | ORAL_TABLET | ORAL | Status: DC | PRN

## 2016-02-22 MED ORDER — ZOLPIDEM TARTRATE 5 MG PO TABS: 5.0000 mg | ORAL_TABLET | Freq: Every evening | ORAL | Status: DC | PRN

## 2016-02-22 MED ORDER — ONDANSETRON HCL 4 MG PO TABS
4.0000 mg | ORAL_TABLET | Freq: Four times a day (QID) | ORAL | Status: DC | PRN
  Administered 2016-02-23: 4 mg via ORAL
  Filled 2016-02-22 (×2): qty 1

## 2016-02-22 MED ORDER — PHENOL 1.4 % MT LIQD
1.0000 | OROMUCOSAL | Status: DC | PRN
Start: 2016-02-22 — End: 2016-02-23

## 2016-02-22 MED ORDER — MEPERIDINE HCL 25 MG/ML IJ SOLN: 6.2500 mg | INTRAMUSCULAR | Status: DC | PRN

## 2016-02-22 MED ORDER — MENTHOL 3 MG MT LOZG: 1.0000 | LOZENGE | OROMUCOSAL | Status: DC | PRN

## 2016-02-22 MED ORDER — OXYCODONE HCL 5 MG PO TABS
ORAL_TABLET | ORAL | Status: AC
  Administered 2016-02-22: 5 mg via ORAL
  Filled 2016-02-22: qty 1

## 2016-02-22 MED ORDER — METOCLOPRAMIDE HCL 5 MG PO TABS: 5.0000 mg | ORAL_TABLET | Freq: Three times a day (TID) | ORAL | Status: DC | PRN

## 2016-02-22 MED ORDER — OXYCODONE HCL ER 10 MG PO T12A
10.0000 mg | EXTENDED_RELEASE_TABLET | Freq: Two times a day (BID) | ORAL | Status: DC
  Administered 2016-02-22 – 2016-02-23 (×2): 10 mg via ORAL
  Filled 2016-02-22 (×2): qty 1

## 2016-02-22 MED ORDER — BUPIVACAINE-EPINEPHRINE (PF) 0.25% -1:200000 IJ SOLN
INTRAMUSCULAR | Status: DC | PRN
  Administered 2016-02-22: 30 mL

## 2016-02-22 MED ORDER — CYCLOSPORINE 0.05 % OP EMUL
1.0000 [drp] | Freq: Two times a day (BID) | OPHTHALMIC | Status: DC
  Administered 2016-02-22 – 2016-02-23 (×2): 1 [drp] via OPHTHALMIC
  Filled 2016-02-22 (×3): qty 1

## 2016-02-22 MED ORDER — ALUM & MAG HYDROXIDE-SIMETH 200-200-20 MG/5ML PO SUSP: 30.0000 mL | ORAL | Status: DC | PRN

## 2016-02-22 MED ORDER — SENNOSIDES-DOCUSATE SODIUM 8.6-50 MG PO TABS: 1.0000 | ORAL_TABLET | Freq: Every evening | ORAL | Status: DC | PRN

## 2016-02-22 MED ORDER — ONDANSETRON HCL 4 MG/2ML IJ SOLN: 4.0000 mg | Freq: Once | INTRAMUSCULAR | Status: DC | PRN

## 2016-02-22 MED ORDER — ENOXAPARIN SODIUM 30 MG/0.3ML ~~LOC~~ SOLN
30.0000 mg | Freq: Two times a day (BID) | SUBCUTANEOUS | Status: DC
  Administered 2016-02-23: 30 mg via SUBCUTANEOUS
  Filled 2016-02-22: qty 0.3

## 2016-02-22 MED ORDER — SODIUM CHLORIDE 0.9 % IJ SOLN
INTRAMUSCULAR | Status: DC | PRN
  Administered 2016-02-22 (×2): 10 mL

## 2016-02-22 MED ORDER — CHLORHEXIDINE GLUCONATE 4 % EX LIQD: 60.0000 mL | Freq: Once | CUTANEOUS | Status: DC

## 2016-02-22 MED ORDER — OXYCODONE HCL 5 MG PO TABS
5.0000 mg | ORAL_TABLET | Freq: Once | ORAL | Status: AC | PRN
  Administered 2016-02-22: 5 mg via ORAL

## 2016-02-22 MED ORDER — FENTANYL CITRATE (PF) 250 MCG/5ML IJ SOLN
INTRAMUSCULAR | Status: AC
  Filled 2016-02-22: qty 5

## 2016-02-22 MED ORDER — AMITRIPTYLINE HCL 10 MG PO TABS
10.0000 mg | ORAL_TABLET | Freq: Every evening | ORAL | Status: DC | PRN
  Filled 2016-02-22: qty 2

## 2016-02-22 MED ORDER — POTASSIUM CHLORIDE 10 MEQ/50ML IV SOLN
10.0000 meq | INTRAVENOUS | Status: DC
  Filled 2016-02-22: qty 50

## 2016-02-22 MED ORDER — OXYCODONE HCL 5 MG/5ML PO SOLN: 5.0000 mg | Freq: Once | ORAL | Status: AC | PRN

## 2016-02-22 MED ORDER — BISACODYL 5 MG PO TBEC
5.0000 mg | DELAYED_RELEASE_TABLET | Freq: Every day | ORAL | Status: DC | PRN
  Administered 2016-02-23: 5 mg via ORAL
  Filled 2016-02-22: qty 1

## 2016-02-22 MED ORDER — BUPIVACAINE LIPOSOME 1.3 % IJ SUSP
20.0000 mL | INTRAMUSCULAR | Status: DC
  Filled 2016-02-22: qty 20

## 2016-02-22 MED ORDER — LEVOTHYROXINE SODIUM 25 MCG PO TABS
25.0000 ug | ORAL_TABLET | Freq: Every day | ORAL | Status: DC
  Administered 2016-02-23: 25 ug via ORAL
  Filled 2016-02-22: qty 1

## 2016-02-22 MED ORDER — SODIUM CHLORIDE 0.9 % IR SOLN
Status: DC | PRN
  Administered 2016-02-22 (×2): 1000 mL

## 2016-02-22 MED ORDER — PANTOPRAZOLE SODIUM 40 MG PO TBEC
40.0000 mg | DELAYED_RELEASE_TABLET | Freq: Every day | ORAL | Status: DC
  Administered 2016-02-22 – 2016-02-23 (×2): 40 mg via ORAL
  Filled 2016-02-22 (×2): qty 1

## 2016-02-22 MED ORDER — CEFAZOLIN SODIUM-DEXTROSE 2-4 GM/100ML-% IV SOLN
INTRAVENOUS | Status: AC
  Filled 2016-02-22: qty 100

## 2016-02-22 MED ORDER — METOPROLOL SUCCINATE ER 25 MG PO TB24
25.0000 mg | ORAL_TABLET | Freq: Every evening | ORAL | Status: DC
  Administered 2016-02-22: 25 mg via ORAL
  Filled 2016-02-22: qty 1

## 2016-02-22 MED ORDER — BUPIVACAINE IN DEXTROSE 0.75-8.25 % IT SOLN
INTRATHECAL | Status: DC | PRN
  Administered 2016-02-22: 2 mL via INTRATHECAL

## 2016-02-22 MED ORDER — METOCLOPRAMIDE HCL 5 MG/ML IJ SOLN
5.0000 mg | Freq: Three times a day (TID) | INTRAMUSCULAR | Status: DC | PRN
  Administered 2016-02-23: 10 mg via INTRAVENOUS
  Filled 2016-02-22: qty 2

## 2016-02-22 MED ORDER — ACETAMINOPHEN 500 MG PO TABS
ORAL_TABLET | ORAL | Status: AC
  Administered 2016-02-22: 1000 mg via ORAL
  Filled 2016-02-22: qty 2

## 2016-02-22 MED ORDER — CEFAZOLIN SODIUM 1-5 GM-% IV SOLN
1.0000 g | Freq: Four times a day (QID) | INTRAVENOUS | Status: AC
  Administered 2016-02-22 (×2): 1 g via INTRAVENOUS
  Filled 2016-02-22 (×2): qty 50

## 2016-02-22 MED ORDER — PROPOFOL 500 MG/50ML IV EMUL
INTRAVENOUS | Status: AC
  Filled 2016-02-22: qty 50

## 2016-02-22 MED ORDER — ONDANSETRON HCL 4 MG/2ML IJ SOLN
4.0000 mg | Freq: Four times a day (QID) | INTRAMUSCULAR | Status: DC | PRN
  Administered 2016-02-22 – 2016-02-23 (×2): 4 mg via INTRAVENOUS
  Filled 2016-02-22 (×3): qty 2

## 2016-02-22 SURGICAL SUPPLY — 63 items
BANDAGE ESMARK 6X9 LF (GAUZE/BANDAGES/DRESSINGS) ×1 IMPLANT
BLADE SAGITTAL 13X1.27X60 (BLADE) ×2 IMPLANT
BLADE SAGITTAL 13X1.27X60MM (BLADE) ×1
BLADE SAW SGTL 83.5X18.5 (BLADE) ×3 IMPLANT
BLADE SURG 10 STRL SS (BLADE) ×3 IMPLANT
BNDG CMPR 9X6 STRL LF SNTH (GAUZE/BANDAGES/DRESSINGS) ×1
BNDG ESMARK 6X9 LF (GAUZE/BANDAGES/DRESSINGS) ×3
BOWL SMART MIX CTS (DISPOSABLE) ×3 IMPLANT
CAPT KNEE TOTAL 3 ×3 IMPLANT
CEMENT BONE SIMPLEX SPEEDSET (Cement) ×6 IMPLANT
COVER SURGICAL LIGHT HANDLE (MISCELLANEOUS) ×3 IMPLANT
CUFF TOURNIQUET SINGLE 34IN LL (TOURNIQUET CUFF) ×3 IMPLANT
DRAPE EXTREMITY T 121X128X90 (DRAPE) ×3 IMPLANT
DRAPE INCISE IOBAN 66X45 STRL (DRAPES) ×6 IMPLANT
DRAPE PROXIMA HALF (DRAPES) IMPLANT
DRAPE U-SHAPE 47X51 STRL (DRAPES) ×3 IMPLANT
DRESSING AQUACEL AG SP 3.5X10 (GAUZE/BANDAGES/DRESSINGS) IMPLANT
DRSG ADAPTIC 3X8 NADH LF (GAUZE/BANDAGES/DRESSINGS) ×3 IMPLANT
DRSG AQUACEL AG SP 3.5X10 (GAUZE/BANDAGES/DRESSINGS) ×3
DRSG PAD ABDOMINAL 8X10 ST (GAUZE/BANDAGES/DRESSINGS) ×3 IMPLANT
DURAPREP 26ML APPLICATOR (WOUND CARE) ×6 IMPLANT
ELECT REM PT RETURN 9FT ADLT (ELECTROSURGICAL) ×3
ELECTRODE REM PT RTRN 9FT ADLT (ELECTROSURGICAL) ×1 IMPLANT
GAUZE SPONGE 4X4 12PLY STRL (GAUZE/BANDAGES/DRESSINGS) ×3 IMPLANT
GLOVE BIOGEL M 7.0 STRL (GLOVE) IMPLANT
GLOVE BIOGEL PI IND STRL 7.5 (GLOVE) IMPLANT
GLOVE BIOGEL PI IND STRL 8.5 (GLOVE) ×5 IMPLANT
GLOVE BIOGEL PI INDICATOR 7.5 (GLOVE)
GLOVE BIOGEL PI INDICATOR 8.5 (GLOVE) ×10
GLOVE SURG ORTHO 8.0 STRL STRW (GLOVE) ×18 IMPLANT
GOWN STRL REUS W/ TWL LRG LVL3 (GOWN DISPOSABLE) ×1 IMPLANT
GOWN STRL REUS W/ TWL XL LVL3 (GOWN DISPOSABLE) ×2 IMPLANT
GOWN STRL REUS W/TWL 2XL LVL3 (GOWN DISPOSABLE) ×3 IMPLANT
GOWN STRL REUS W/TWL LRG LVL3 (GOWN DISPOSABLE) ×3
GOWN STRL REUS W/TWL XL LVL3 (GOWN DISPOSABLE) ×6
HANDPIECE INTERPULSE COAX TIP (DISPOSABLE) ×3
HOOD PEEL AWAY FACE SHEILD DIS (HOOD) ×9 IMPLANT
KIT BASIN OR (CUSTOM PROCEDURE TRAY) ×3 IMPLANT
KIT ROOM TURNOVER OR (KITS) ×3 IMPLANT
KNEE CAPITATED TOTAL 3 IMPLANT
MANIFOLD NEPTUNE II (INSTRUMENTS) ×3 IMPLANT
NEEDLE 22X1 1/2 (OR ONLY) (NEEDLE) ×6 IMPLANT
NS IRRIG 1000ML POUR BTL (IV SOLUTION) ×3 IMPLANT
PACK TOTAL JOINT (CUSTOM PROCEDURE TRAY) ×3 IMPLANT
PACK UNIVERSAL I (CUSTOM PROCEDURE TRAY) ×3 IMPLANT
PAD ARMBOARD 7.5X6 YLW CONV (MISCELLANEOUS) ×6 IMPLANT
PADDING CAST ABS 4INX4YD NS (CAST SUPPLIES) ×2
PADDING CAST ABS COTTON 4X4 ST (CAST SUPPLIES) IMPLANT
PADDING CAST COTTON 6X4 STRL (CAST SUPPLIES) ×3 IMPLANT
SET HNDPC FAN SPRY TIP SCT (DISPOSABLE) ×1 IMPLANT
STAPLER VISISTAT 35W (STAPLE) ×3 IMPLANT
SUCTION FRAZIER HANDLE 10FR (MISCELLANEOUS)
SUCTION TUBE FRAZIER 10FR DISP (MISCELLANEOUS) ×1 IMPLANT
SUT BONE WAX W31G (SUTURE) ×3 IMPLANT
SUT VIC AB 0 CTB1 27 (SUTURE) ×6 IMPLANT
SUT VIC AB 1 CT1 27 (SUTURE) ×6
SUT VIC AB 1 CT1 27XBRD ANBCTR (SUTURE) ×2 IMPLANT
SUT VIC AB 2-0 CT1 27 (SUTURE) ×6
SUT VIC AB 2-0 CT1 TAPERPNT 27 (SUTURE) ×2 IMPLANT
SYR 20CC LL (SYRINGE) ×6 IMPLANT
TOWEL OR 17X24 6PK STRL BLUE (TOWEL DISPOSABLE) ×3 IMPLANT
TOWEL OR 17X26 10 PK STRL BLUE (TOWEL DISPOSABLE) ×3 IMPLANT
WATER STERILE IRR 1000ML POUR (IV SOLUTION) ×2 IMPLANT

## 2016-02-22 NOTE — Progress Notes (Signed)
Orthopedic Tech Progress Note Patient Details:  Rebekah Valentine 10-04-1945 [id number]  CPM Right Knee CPM Right Knee: On Right Knee Flexion (Degrees): 90 Right Knee Extension (Degrees): 0 Additional Comments: trapeze bar patient helper Viewed order from doctor's order list  Hildred Priest 02/22/2016, 2:22 PM

## 2016-02-22 NOTE — H&P (Signed)
Rebekah Valentine MRN:  [id number] DOB/SEX:  12-15-1945/female  CHIEF COMPLAINT:  Painful right Knee  HISTORY: Patient is a 70 y.o. female presented with a history of pain in the right knee. Onset of symptoms was gradual starting a few years ago with gradually worsening course since that time. Patient has been treated conservatively with over-the-counter NSAIDs and activity modification. Patient currently rates pain in the knee at 10 out of 10 with activity. There is pain at night.  PAST MEDICAL HISTORY: Patient Active Problem List   Diagnosis Date Noted  . HTN (hypertension) 01/13/2016  . Preventative health care 11/24/2014  . GERD (gastroesophageal reflux disease) 10/14/2014  . Hypothyroidism 03/21/2011  . TROCHANTERIC BURSITIS, RIGHT 01/07/2010  . Insomnia 12/30/2008  . HEMORRHOIDS 12/12/2008  . SKIN CANCER, HX OF 12/12/2008  . COLONIC POLYPS, HX OF 12/12/2008   Past Medical History  Diagnosis Date  . COLONIC POLYPS, HX OF 11/2007  . SKIN CANCER, HX OF   . INSOMNIA   . HEMORRHOIDS   . TROCHANTERIC BURSITIS, RIGHT   . HYPERTENSION   . Breast cyst     recurrent  . Knee pain     Arthritis   . Hypothyroidism   . GERD (gastroesophageal reflux disease)   . Arthritis    Past Surgical History  Procedure Laterality Date  . Colon polpectomy  11/2007    Dr. Fuller Plan  . Lumpectomy Right 1990    breast  . Tonsillectomy  1957  . Hysteroscopy      polyp  . Tonsillectomy and adenoidectomy    . Basal cell carcinoma excision      face     MEDICATIONS:   No prescriptions prior to admission    ALLERGIES:  No Known Allergies  REVIEW OF SYSTEMS:  A comprehensive review of systems was negative except for: Musculoskeletal: positive for arthralgias and stiff joints   FAMILY HISTORY:   Family History  Problem Relation Age of Onset  . Arthritis Father   . Coronary artery disease Father   . Hypertension Father   . Heart disease Father   . Breast cancer Paternal Aunt   . Diabetes  Paternal Grandmother   . COPD Sister     smoker  . Blindness Sister     since birth  . Congestive Heart Failure Sister   . COPD Mother     smoker  . Colon cancer Neg Hx     SOCIAL HISTORY:   Social History  Substance Use Topics  . Smoking status: Never Smoker   . Smokeless tobacco: Never Used     Comment: Married, lives with spouse. Retired asst. teacher-subs now [employer]  . Alcohol Use: 0.6 - 1.2 oz/week    1-2 Glasses of wine per week     Comment: occassional glass of wine     EXAMINATION:  Vital signs in last 24 hours:    There were no vitals taken for this visit.  General Appearance:    Alert, cooperative, no distress, appears stated age  Head:    Normocephalic, without obvious abnormality, atraumatic  Eyes:    PERRL, conjunctiva/corneas clear, EOM's intact, fundi    benign, both eyes  Ears:    Normal TM's and external ear canals, both ears  Nose:   Nares normal, septum midline, mucosa normal, no drainage    or sinus tenderness  Throat:   Lips, mucosa, and tongue normal; teeth and gums normal  Neck:   Supple, symmetrical, trachea midline, no adenopathy;  thyroid:  no enlargement/tenderness/nodules; no carotid   bruit or JVD  Back:     Symmetric, no curvature, ROM normal, no CVA tenderness  Lungs:     Clear to auscultation bilaterally, respirations unlabored  Chest Wall:    No tenderness or deformity   Heart:    Regular rate and rhythm, S1 and S2 normal, no murmur, rub   or gallop  Breast Exam:    No tenderness, masses, or nipple abnormality  Abdomen:     Soft, non-tender, bowel sounds active all four quadrants,    no masses, no organomegaly  Genitalia:    Normal female without lesion, discharge or tenderness  Rectal:    Normal tone, no masses or tenderness;   guaiac negative stool  Extremities:   Extremities normal, atraumatic, no cyanosis or edema  Pulses:   2+ and symmetric all extremities  Skin:   Skin color, texture, turgor normal, no rashes or lesions  Lymph  nodes:   Cervical, supraclavicular, and axillary nodes normal  Neurologic:   CNII-XII intact, normal strength, sensation and reflexes    throughout    Musculoskeletal:  ROM 0-120, Ligaments intact,  Imaging Review Plain radiographs demonstrate severe degenerative joint disease of the right knee. The overall alignment is neutral. The bone quality appears to be excellent for age and reported activity level.  Assessment/Plan: Primary osteoarthritis, right knee   The patient history, physical examination and imaging studies are consistent with advanced degenerative joint disease of the right knee. The patient has failed conservative treatment.  The clearance notes were reviewed.  After discussion with the patient it was felt that Total Knee Replacement was indicated. The procedure,  risks, and benefits of total knee arthroplasty were presented and reviewed. The risks including but not limited to aseptic loosening, infection, blood clots, vascular injury, stiffness, patella tracking problems complications among others were discussed. The patient acknowledged the explanation, agreed to proceed with the plan.  Donia Ast 02/22/2016, 6:34 AM

## 2016-02-22 NOTE — Anesthesia Postprocedure Evaluation (Signed)
Anesthesia Post Note  Patient: Rebekah Valentine  Procedure(s) Performed: Procedure(s) (LRB): RIGHT TOTAL KNEE ARTHROPLASTY (Right)  Patient location during evaluation: PACU Anesthesia Type: Spinal Level of consciousness: oriented and awake and alert Pain management: pain level controlled Vital Signs Assessment: post-procedure vital signs reviewed and stable Respiratory status: spontaneous breathing, respiratory function stable and patient connected to nasal cannula oxygen Cardiovascular status: blood pressure returned to baseline and stable Postop Assessment: no headache and no backache Anesthetic complications: no    Last Vitals:  Filed Vitals:   02/22/16 1507 02/22/16 1547  BP: 144/76 156/62  Pulse: 77 70  Temp:  36.6 C  Resp: 17 18    Last Pain:  Filed Vitals:   02/22/16 1649  PainSc: 6                  Aarya Robinson DAVID

## 2016-02-22 NOTE — Anesthesia Procedure Notes (Addendum)
Procedure Name: MAC Date/Time: 02/22/2016 11:06 AM Performed by: Maryland Pink Pre-anesthesia Checklist: Patient identified, Emergency Drugs available, Suction available, Timeout performed and Patient being monitored Patient Re-evaluated:Patient Re-evaluated prior to inductionOxygen Delivery Method: Simple face mask Preoxygenation: Pre-oxygenation with 100% oxygen Intubation Type: IV induction Placement Confirmation: positive ETCO2   Spinal Patient location during procedure: OR Staffing Anesthesiologist: Torrell Krutz Preanesthetic Checklist Completed: patient identified, surgical consent, pre-op evaluation, timeout performed, IV checked, risks and benefits discussed and monitors and equipment checked Spinal Block Patient position: sitting Prep: site prepped and draped and DuraPrep Patient monitoring: heart rate, cardiac monitor, continuous pulse ox and blood pressure Approach: midline Location: L3-4 Injection technique: single-shot Needle Needle type: Pencan  Needle gauge: 24 G Needle length: 10 cm Assessment Sensory level: T6

## 2016-02-22 NOTE — Evaluation (Signed)
Physical Therapy Evaluation Patient Details Name: Rebekah Valentine MRN: [id number] DOB: Feb 09, 1946 Today's Date: 02/22/2016   History of Present Illness  70 y.o. female admitted to [facility] on 02/22/16 for elective R TKA.  Pt with no significant PMHx.   Clinical Impression  Pt is POD #0 and moving well, albeit, did have some nausea after short distance gait to the bathroom and hallway and back.  Pt will likely progress well enough to d/c home with HHPT.   PT to follow acutely for deficits listed below.       Follow Up Recommendations Home health PT;Supervision for mobility/OOB    Equipment Recommendations  Rolling walker with 5" wheels    Recommendations for Other Services   NA    Precautions / Restrictions Precautions Precautions: Knee Precaution Booklet Issued: Yes (comment) Precaution Comments: knee exercise handout given Restrictions Weight Bearing Restrictions: No RLE Weight Bearing: Weight bearing as tolerated      Mobility  Bed Mobility Overal bed mobility: Modified Independent                Transfers Overall transfer level: Needs assistance   Transfers: Sit to/from Stand Sit to Stand: Supervision         General transfer comment: pt standing before PT was able to adjust RW for height.  Verbal cues for safety.    Ambulation/Gait Ambulation/Gait assistance: Min guard Ambulation Distance (Feet): 35 Feet Assistive device: Rolling walker (2 wheeled) Gait Pattern/deviations: Step-through pattern;Antalgic     General Gait Details: Pt with mildly antalgic gait pattern, verbal cues for upright posture.  Pt felt nauseated after walk, but not wanting anything from RN for nausea.           Balance Overall balance assessment: Needs assistance Sitting-balance support: Feet supported;Bilateral upper extremity supported;No upper extremity supported Sitting balance-Leahy Scale: Good     Standing balance support: Bilateral upper extremity supported Standing  balance-Leahy Scale: Fair                               Pertinent Vitals/Pain Pain Assessment: Faces Faces Pain Scale: Hurts little more Pain Location: right knee Pain Descriptors / Indicators: Grimacing;Guarding Pain Intervention(s): Limited activity within patient's tolerance;Monitored during session;Repositioned    Home Living Family/patient expects to be discharged to:: Private residence Living Arrangements: Spouse/significant other                    Prior Function Level of Independence: Independent                  Extremity/Trunk Assessment   Upper Extremity Assessment: Defer to OT evaluation           Lower Extremity Assessment: RLE deficits/detail RLE Deficits / Details: pt is able to lift her right leg against gravity and progress it to EOB independently.     Cervical / Trunk Assessment: Normal  Communication   Communication: No difficulties  Cognition Arousal/Alertness: Awake/alert Behavior During Therapy: WFL for tasks assessed/performed Overall Cognitive Status: Within Functional Limits for tasks assessed                         Exercises Total Joint Exercises Ankle Circles/Pumps: AROM;Both;20 reps      Assessment/Plan    PT Assessment Patient needs continued PT services  PT Diagnosis Difficulty walking;Abnormality of gait;Generalized weakness;Acute pain   PT Problem List Decreased strength;Decreased range of motion;Decreased activity tolerance;Decreased  balance;Decreased mobility;Decreased knowledge of use of DME;Decreased knowledge of precautions;Pain  PT Treatment Interventions DME instruction;Gait training;Stair training;Functional mobility training;Therapeutic exercise;Therapeutic activities;Neuromuscular re-education;Balance training;Patient/family education;Manual techniques;Modalities   PT Goals (Current goals can be found in the Care Plan section) Acute Rehab PT Goals Patient Stated Goal: to get better so  she can go home to her dog PT Goal Formulation: With patient Time For Goal Achievement: 02/29/16 Potential to Achieve Goals: Good    Frequency 7X/week           End of Session   Activity Tolerance: Patient limited by pain;Other (comment) (limited by weakness and nausea) Patient left: in chair;with call bell/phone within reach Nurse Communication: Mobility status         Time: 1650-1705 PT Time Calculation (min) (ACUTE ONLY): 15 min   Charges:   PT Evaluation $PT Eval Low Complexity: 1 Procedure          Nicholle Falzon B. [place], [place], DPT [phone]   02/22/2016, 5:52 PM

## 2016-02-22 NOTE — Op Note (Signed)
TOTAL KNEE REPLACEMENT OPERATIVE NOTE:  02/22/2016  3:55 PM  PATIENT:  Rebekah Valentine  70 y.o. female  PRE-OPERATIVE DIAGNOSIS:  primary osteoarthritis right knee  POST-OPERATIVE DIAGNOSIS:  primary osteoarthritis right knee  PROCEDURE:  Procedure(s): RIGHT TOTAL KNEE ARTHROPLASTY  SURGEON:  Surgeon(s): Vickey Huger, MD  PHYSICIAN ASSISTANT: Carlyon Shadow, [facility]   ANESTHESIA:   spinal  DRAINS: Hemovac  SPECIMEN: None  COUNTS:  Correct  TOURNIQUET:   Total Tourniquet Time Documented: Thigh (Right) - 47 minutes Total: Thigh (Right) - 47 minutes   DICTATION:  Indication for procedure:    The patient is a 70 y.o. female who has failed conservative treatment for primary osteoarthritis right knee.  Informed consent was obtained prior to anesthesia. The risks versus benefits of the operation were explain and in a way the patient can, and did, understand.   On the implant demand matching protocol, this patient scored 7.  Therefore, this patient did" "did not receive a polyethylene insert with vitamin E which is a high demand implant.  Description of procedure:     The patient was taken to the operating room and placed under anesthesia.  The patient was positioned in the usual fashion taking care that all body parts were adequately padded and/or protected.  I foley catheter was not placed.  A tourniquet was applied and the leg prepped and draped in the usual sterile fashion.  The extremity was exsanguinated with the esmarch and tourniquet inflated to 350 mmHg.  Pre-operative range of motion was normal.  The knee was in 5 degree of mild varus.  A midline incision approximately 6-7 inches long was made with a #10 blade.  A new blade was used to make a parapatellar arthrotomy going 2-3 cm into the quadriceps tendon, over the patella, and alongside the medial aspect of the patellar tendon.  A synovectomy was then performed with the #10 blade and forceps. I then elevated the deep MCL off  the medial tibial metaphysis subperiosteally around to the semimembranosus attachment.    I everted the patella and used calipers to measure patellar thickness.  I used the reamer to ream down to appropriate thickness to recreate the native thickness.  I then removed excess bone with the rongeur and sagittal saw.  I used the appropriately sized template and drilled the three lug holes.  I then put the trial in place and measured the thickness with the calipers to ensure recreation of the native thickness.  The trial was then removed and the patella subluxed and the knee brought into flexion.  A homan retractor was place to retract and protect the patella and lateral structures.  A Z-retractor was place medially to protect the medial structures.  The extra-medullary alignment system was used to make cut the tibial articular surface perpendicular to the anamotic axis of the tibia and in 3 degrees of posterior slope.  The cut surface and alignment jig was removed.  I then used the intramedullary alignment guide to make a 6 valgus cut on the distal femur.  I then marked out the epicondylar axis on the distal femur.  The posterior condylar axis measured 3 degrees.  I then used the anterior referencing sizer and measured the femur to be a size 6.  The 4-In-1 cutting block was screwed into place in external rotation matching the posterior condylar angle, making our cuts perpendicular to the epicondylar axis.  Anterior, posterior and chamfer cuts were made with the sagittal saw.  The cutting block and  cut pieces were removed.  A lamina spreader was placed in 90 degrees of flexion.  The ACL, PCL, menisci, and posterior condylar osteophytes were removed.  A 11 mm spacer blocked was found to offer good flexion and extension gap balance after minimal in degree releasing.   The scoop retractor was then placed and the femoral finishing block was pinned in place.  The small sagittal saw was used as well as the lug drill to  finish the femur.  The block and cut surfaces were removed and the medullary canal hole filled with autograft bone from the cut pieces.  The tibia was delivered forward in deep flexion and external rotation.  A size C tray was selected and pinned into place centered on the medial 1/3 of the tibial tubercle.  The reamer and keel was used to prepare the tibia through the tray.    I then trialed with the size 6 femur, size C tibia, a 11 mm insert and the 30 patella.  I had excellent flexion/extension gap balance, excellent patella tracking.  Flexion was full and beyond 120 degrees; extension was zero.  These components were chosen and the staff opened them to me on the back table while the knee was lavaged copiously and the cement mixed.  The soft tissue was infiltrated with 60cc of exparel 1.3% through a 21 gauge needle.  I cemented in the components and removed all excess cement.  The polyethylene tibial component was snapped into place and the knee placed in extension while cement was hardening.  The capsule was infilltrated with 30cc of .25% Marcaine with epinephrine.  A hemovac was place in the joint exiting superolaterally.  A pain pump was place superomedially superficial to the arthrotomy.  Once the cement was hard, the tourniquet was let down.  Hemostasis was obtained.  The arthrotomy was closed with figure-8 #1 vicryl sutures.  The deep soft tissues were closed with #0 vicryls and the subcuticular layer closed with a running #2-0 vicryl.  The skin was reapproximated and closed with skin staples.  The wound was dressed with xeroform, 4 x4's, 2 ABD sponges, a single layer of webril and a TED stocking.   The patient was then awakened, extubated, and taken to the recovery room in stable condition.  BLOOD LOSS:  300cc DRAINS: 1 hemovac, 1 pain catheter COMPLICATIONS:  None.  PLAN OF CARE: Admit to inpatient   PATIENT DISPOSITION:  PACU - hemodynamically stable.   Delay start of Pharmacological  VTE agent (>24hrs) due to surgical blood loss or risk of bleeding:  not applicable  Please fax a copy of this op note to my office at [phone] (please only include page 1 and 2 of the Case Information op note)

## 2016-02-22 NOTE — Anesthesia Preprocedure Evaluation (Signed)
Anesthesia Evaluation  Patient identified by MRN, date of birth, ID band Patient awake    Reviewed: Allergy & Precautions, NPO status , Patient's Chart, lab work & pertinent test results  Airway Mallampati: I  TM Distance: >3 FB Neck ROM: Full    Dental   Pulmonary    Pulmonary exam normal        Cardiovascular hypertension, Pt. on medications Normal cardiovascular exam     Neuro/Psych    GI/Hepatic GERD  Medicated and Controlled,  Endo/Other  Hypothyroidism   Renal/GU      Musculoskeletal   Abdominal   Peds  Hematology   Anesthesia Other Findings   Reproductive/Obstetrics                             Anesthesia Physical Anesthesia Plan  ASA: II  Anesthesia Plan: Spinal   Post-op Pain Management:    Induction: Intravenous  Airway Management Planned: Natural Airway  Additional Equipment:   Intra-op Plan:   Post-operative Plan:   Informed Consent: I have reviewed the patients History and Physical, chart, labs and discussed the procedure including the risks, benefits and alternatives for the proposed anesthesia with the patient or authorized representative who has indicated his/her understanding and acceptance.     Plan Discussed with: CRNA and Surgeon  Anesthesia Plan Comments:         Anesthesia Quick Evaluation

## 2016-02-22 NOTE — Transfer of Care (Signed)
Immediate Anesthesia Transfer of Care Note  Patient: Rebekah Valentine  Procedure(s) Performed: Procedure(s): RIGHT TOTAL KNEE ARTHROPLASTY (Right)  Patient Location: PACU  Anesthesia Type:MAC and Spinal  Level of Consciousness: awake, alert  and oriented  Airway & Oxygen Therapy: Patient Spontanous Breathing  Post-op Assessment: Report given to RN and Post -op Vital signs reviewed and stable  Post vital signs: Reviewed and stable  Last Vitals:  Filed Vitals:   02/22/16 0902  BP: 168/66  Pulse: 70  Temp: 36.9 C  Resp: 20    Last Pain: There were no vitals filed for this visit.       Complications: No apparent anesthesia complications

## 2016-02-23 ENCOUNTER — Encounter (HOSPITAL_COMMUNITY): Payer: Self-pay | Admitting: Orthopedic Surgery

## 2016-02-23 LAB — BASIC METABOLIC PANEL
ANION GAP: 7 (ref 5–15)
BUN: 14 mg/dL (ref 6–20)
CALCIUM: 8.3 mg/dL — AB (ref 8.9–10.3)
CHLORIDE: 103 mmol/L (ref 101–111)
CO2: 28 mmol/L (ref 22–32)
Creatinine, Ser: 0.85 mg/dL (ref 0.44–1.00)
GFR calc Af Amer: >60 mL/min (ref >60)
GFR calc non Af Amer: >60 mL/min (ref >60)
GLUCOSE: 125 mg/dL — AB (ref 65–99)
Potassium: 4.4 mmol/L (ref 3.5–5.1)
Sodium: 138 mmol/L (ref 135–145)

## 2016-02-23 LAB — CBC
HEMATOCRIT: 36.9 % (ref 36.0–46.0)
Hemoglobin: 12.1 g/dL (ref 12.0–15.0)
MCH: 27.4 pg (ref 26.0–34.0)
MCHC: 32.8 g/dL (ref 30.0–36.0)
MCV: 83.7 fL (ref 78.0–100.0)
Platelets: 225 10*3/uL (ref 150–400)
RBC: 4.41 MIL/uL (ref 3.87–5.11)
RDW: 12.5 % (ref 11.5–15.5)
WBC: 7.3 10*3/uL (ref 4.0–10.5)

## 2016-02-23 MED ORDER — METHOCARBAMOL 500 MG PO TABS: 500.0000 mg | ORAL_TABLET | Freq: Four times a day (QID) | ORAL | Status: DC | PRN

## 2016-02-23 MED ORDER — ENOXAPARIN (LOVENOX) PATIENT EDUCATION KIT
PACK | Freq: Once | Status: DC
  Filled 2016-02-23: qty 1

## 2016-02-23 MED ORDER — OXYCODONE HCL ER 10 MG PO T12A: 10.0000 mg | EXTENDED_RELEASE_TABLET | Freq: Two times a day (BID) | ORAL | Status: DC

## 2016-02-23 MED ORDER — OXYCODONE HCL 5 MG PO TABS: 5.0000 mg | ORAL_TABLET | ORAL | Status: DC | PRN

## 2016-02-23 MED ORDER — ENOXAPARIN SODIUM 40 MG/0.4ML ~~LOC~~ SOLN: 40.0000 mg | SUBCUTANEOUS | Status: DC

## 2016-02-23 MED ORDER — ONDANSETRON HCL 4 MG PO TABS: 4.0000 mg | ORAL_TABLET | Freq: Four times a day (QID) | ORAL | Status: DC | PRN

## 2016-02-23 NOTE — Progress Notes (Signed)
Physical Therapy Treatment Patient Details Name: Rebekah Valentine MRN: [id number] DOB: [birth year] Today's Date: 02/23/2016    History of Present Illness 70 y.o. female admitted to [facility] on 02/22/16 for elective R TKA.  Pt with no significant PMHx.     PT Comments    Pt performed increased activity including increased gait and introduction to supine therapeutic exercise.  Pt remains to c/o nausea.  Will f/u this pm to assess ROM and advance mobility/activity.  Follow Up Recommendations  Home health PT;Supervision for mobility/OOB     Equipment Recommendations  Rolling walker with 5" wheels    Recommendations for Other Services       Precautions / Restrictions Precautions Precautions: Knee Precaution Booklet Issued: Yes (comment) Precaution Comments: knee exercise handout given Restrictions Weight Bearing Restrictions: No RLE Weight Bearing: Weight bearing as tolerated    Mobility  Bed Mobility Overal bed mobility: Modified Independent                Transfers Overall transfer level: Needs assistance Equipment used: Rolling walker (2 wheeled) Transfers: Sit to/from Stand Sit to Stand: Supervision         General transfer comment: Pt required cues for hand placement before pushing from seated surface.  Pt required cues tio stay seated to allow PTA to adjust RW for height.    Ambulation/Gait Ambulation/Gait assistance: Min guard Ambulation Distance (Feet): 100 Feet Assistive device: Rolling walker (2 wheeled) Gait Pattern/deviations: Step-through pattern;Antalgic;Decreased stride length     General Gait Details: Pt with mildly antalgic gait pattern, verbal cues for upright posture.  Pt felt nauseated after gait training.     Stairs            Wheelchair Mobility    Modified Rankin (Stroke Patients Only)       Balance Overall balance assessment: Needs assistance Sitting-balance support: No upper extremity supported;Feet supported Sitting  balance-Leahy Scale: Good     Standing balance support: Bilateral upper extremity supported;During functional activity Standing balance-Leahy Scale: Fair                      Cognition Arousal/Alertness: Awake/alert Behavior During Therapy: WFL for tasks assessed/performed Overall Cognitive Status: Within Functional Limits for tasks assessed                      Exercises Total Joint Exercises Ankle Circles/Pumps: AROM;Both;10 reps;Supine Quad Sets: AROM;Right;10 reps;Supine Gluteal Sets: AROM;Right;10 reps;Supine Short Arc Quad: AAROM;Right;10 reps;Supine Heel Slides: AAROM;Right;10 reps;Supine Hip ABduction/ADduction: AAROM;Right;10 reps;Supine Straight Leg Raises: AAROM;Right;10 reps;Supine Goniometric ROM: Will assess this afternoon.      General Comments        Pertinent Vitals/Pain Pain Assessment: 0-10 Pain Score: 6  Pain Location: R knee after therapeutic exercise.   Pain Descriptors / Indicators: Grimacing;Guarding;Discomfort Pain Intervention(s): Monitored during session;Repositioned;Ice applied    Home Living Family/patient expects to be discharged to:: Private residence Living Arrangements: Spouse/significant other Available Help at Discharge: Family Type of Home: House Home Access: Level entry   Home Layout: One level        Prior Function Level of Independence: Independent          PT Goals (current goals can now be found in the care plan section) Acute Rehab PT Goals Patient Stated Goal: get better and go home Potential to Achieve Goals: Good Progress towards PT goals: Progressing toward goals    Frequency  7X/week    PT Plan Current plan remains appropriate  Co-evaluation             End of Session Equipment Utilized During Treatment: Gait belt Activity Tolerance: Patient limited by pain;Other (comment) (nausea)       Time: [id number] PT Time Calculation (min) (ACUTE ONLY): 25 min  Charges:  $Gait  Training: 8-22 mins $Therapeutic Exercise: 8-22 mins                    G Codes:      Cristela Blue Mar 07, 2016, 10:46 AM  Governor Rooks, PTA pager [phone]

## 2016-02-23 NOTE — Progress Notes (Signed)
Orthopedic Tech Progress Note Patient Details:  Rebekah Valentine 07/23/1946 [id number]  CPM Right Knee CPM Right Knee: On Right Knee Flexion (Degrees): 90 Right Knee Extension (Degrees): 0 Additional Comments: trapeze bar patient helper   [place] Pink 02/23/2016, 6:55 PM

## 2016-02-23 NOTE — Progress Notes (Signed)
SPORTS MEDICINE AND JOINT REPLACEMENT  Lara Mulch, MD   [facility] PA-C [address] [address], [place], [place]  [address]                             [phone]   PROGRESS NOTE  Subjective:  negative for Chest Pain  negative for Shortness of Breath  negative for Nausea/Vomiting   negative for Calf Pain  negative for Bowel Movement   Tolerating Diet: yes         Patient reports pain as 6 on 0-10 scale.    Objective: Vital signs in last 24 hours:   Patient Vitals for the past 24 hrs:  BP Temp Temp src Pulse Resp SpO2 Height Weight  02/23/16 0601 (!) 156/64 mmHg 98.6 F (37 C) Oral 72 18 95 % - -  02/22/16 2355 (!) 133/52 mmHg 98.6 F (37 C) Oral 86 18 95 % - -  02/22/16 2049 (!) 148/62 mmHg 98.3 F (36.8 C) Oral 77 18 100 % - -  02/22/16 1547 (!) 156/62 mmHg 97.8 F (36.6 C) Oral 70 18 100 % - -  02/22/16 1507 (!) 144/76 mmHg - - 77 17 98 % - -  02/22/16 1500 - 97 F (36.1 C) - 67 13 96 % - -  02/22/16 1440 (!) 155/73 mmHg - - (!) 57 11 100 % - -  02/22/16 1425 (!) 160/64 mmHg - - (!) 54 10 100 % - -  02/22/16 1410 (!) 144/62 mmHg - - (!) 57 16 100 % - -  02/22/16 1355 (!) 141/51 mmHg - - (!) 57 12 97 % - -  02/22/16 1340 (!) 135/59 mmHg - - (!) 58 10 100 % - -  02/22/16 1330 - 97.4 F (36.3 C) - - - - - -  02/22/16 1325 128/61 mmHg - - 69 13 100 % - -  02/22/16 1306 124/63 mmHg (!) 96.4 F (35.8 C) - 65 13 100 % - -  02/22/16 1252 (!) 110/54 mmHg - - 65 10 100 % - -  02/22/16 1250 - (!) 95.4 F (35.2 C) - 64 11 97 % - -  02/22/16 0902 (!) 168/66 mmHg 98.4 F (36.9 C) Oral 70 20 99 % 5\' 3"  (1.6 m) 81.194 kg (179 lb)    @flow {1959:LAST@   Intake/Output from previous day:   06/05 0701 - 06/06 0700 In: 2113.8 [P.O.:240; I.V.:1773.8] Out: 20    Intake/Output this shift:       Intake/Output      06/05 0701 - 06/06 0700 06/06 0701 - 06/07 0700   P.O. 240    I.V. (mL/kg) 1773.8 (21.8)    IV Piggyback 100    Total Intake(mL/kg) 2113.8 (26)    Blood 20     Total Output 20     Net +2093.8          Urine Occurrence 4 x       LABORATORY DATA:  Recent Labs  02/22/16 1642 02/23/16 0550  WBC 6.7 7.3  HGB 13.0 12.1  HCT 39.5 36.9  PLT 220 225    Recent Labs  02/22/16 1642 02/23/16 0550  NA  --  138  K  --  4.4  CL  --  103  CO2  --  28  BUN  --  14  CREATININE 0.78 0.85  GLUCOSE  --  125*  CALCIUM  --  8.3*   Lab  Results  Component Value Date   INR 1.04 02/11/2016   INR 1.1* 01/13/2016    Examination:  General appearance: alert, cooperative and no distress Extremities: extremities normal, atraumatic, no cyanosis or edema  Wound Exam: clean, dry, intact   Drainage:  None: wound tissue dry  Motor Exam: Quadriceps and Hamstrings Intact  Sensory Exam: Superficial Peroneal, Deep Peroneal and Tibial normal   Assessment:    1 Day Post-Op  Procedure(s) (LRB): RIGHT TOTAL KNEE ARTHROPLASTY (Right)  ADDITIONAL DIAGNOSIS:  Active Problems:   S/P total knee replacement  Acute Blood Loss Anemia   Plan: Physical Therapy as ordered Weight Bearing as Tolerated (WBAT)  DVT Prophylaxis:  Lovenox  DISCHARGE PLAN: Home  DISCHARGE NEEDS: HHPT   Patient doing well. Expect D/C to home today         Donia Ast 02/23/2016, 7:08 AM

## 2016-02-23 NOTE — Discharge Summary (Signed)
SPORTS MEDICINE & JOINT REPLACEMENT   Lara Mulch, MD    Carlyon Shadow, PA-C [address], [place], [place]  [address]                             [phone]  PATIENT ID: Rebekah Valentine        MRN:  [id number]          DOB/AGE: 1946/07/09 / 70 y.o.    DISCHARGE SUMMARY  ADMISSION DATE:    02/22/2016 DISCHARGE DATE:   02/23/2016   ADMISSION DIAGNOSIS: primary osteoarthritis right knee    DISCHARGE DIAGNOSIS:  primary osteoarthritis right knee    ADDITIONAL DIAGNOSIS: Active Problems:   S/P total knee replacement  Past Medical History  Diagnosis Date  . COLONIC POLYPS, HX OF 11/2007  . SKIN CANCER, HX OF   . INSOMNIA   . HEMORRHOIDS   . TROCHANTERIC BURSITIS, RIGHT   . HYPERTENSION   . Breast cyst     recurrent  . Knee pain     Arthritis   . Hypothyroidism   . GERD (gastroesophageal reflux disease)   . Arthritis     PROCEDURE: Procedure(s): RIGHT TOTAL KNEE ARTHROPLASTY on 02/22/2016  CONSULTS:     HISTORY:  See H&P in chart  HOSPITAL COURSE:  Rebekah Valentine is a 70 y.o. admitted on 02/22/2016 and found to have a diagnosis of primary osteoarthritis right knee.  After appropriate laboratory studies were obtained  they were taken to the operating room on 02/22/2016 and underwent Procedure(s): RIGHT TOTAL KNEE ARTHROPLASTY.   They were given perioperative antibiotics:  Anti-infectives    Start     Dose/Rate Route Frequency Ordered Stop   02/22/16 1700  ceFAZolin (ANCEF) IVPB 1 g/50 mL premix     1 g 100 mL/hr over 30 Minutes Intravenous Every 6 hours 02/22/16 1250 02/22/16 2319   02/22/16 1030  ceFAZolin (ANCEF) IVPB 2g/100 mL premix     2 g 200 mL/hr over 30 Minutes Intravenous To ShortStay Surgical 02/19/16 1115 02/22/16 1106   02/22/16 0906  ceFAZolin (ANCEF) 2-4 GM/100ML-% IVPB    Comments:  Henrine Screws   : cabinet override      02/22/16 0906 02/22/16 2114    .  Patient given tranexamic acid IV or topical and exparel intra-operatively.  Tolerated  the procedure well.    POD# 1: Vital signs were stable.  Patient denied Chest pain, shortness of breath, or calf pain.  Patient was started on Lovenox 30 mg subcutaneously twice daily at 8am.  Consults to PT, OT, and care management were made.  The patient was weight bearing as tolerated.  CPM was placed on the operative leg 0-90 degrees for 6-8 hours a day. When out of the CPM, patient was placed in the foam block to achieve full extension. Incentive spirometry was taught.  Dressing was changed.       POD #2, Continued  PT for ambulation and exercise program.  IV saline locked.  O2 discontinued.    The remainder of the hospital course was dedicated to ambulation and strengthening.   The patient was discharged on 1 Day Post-Op in  Good condition.  Blood products given:none  DIAGNOSTIC STUDIES: Recent vital signs: Patient Vitals for the past 24 hrs:  BP Temp Temp src Pulse Resp SpO2  02/23/16 1451 (!) 146/61 mmHg 98.4 F (36.9 C) - 67 18 98 %  02/23/16 0856 (!) 153/64 mmHg  99.8 F (37.7 C) Oral 73 18 94 %  02/23/16 0601 (!) 156/64 mmHg 98.6 F (37 C) Oral 72 18 95 %  02/22/16 2355 (!) 133/52 mmHg 98.6 F (37 C) Oral 86 18 95 %  02/22/16 2049 (!) 148/62 mmHg 98.3 F (36.8 C) Oral 77 18 100 %       Recent laboratory studies:  Recent Labs  02/22/16 1642 02/23/16 0550  WBC 6.7 7.3  HGB 13.0 12.1  HCT 39.5 36.9  PLT 220 225    Recent Labs  02/22/16 1642 02/23/16 0550  NA  --  138  K  --  4.4  CL  --  103  CO2  --  28  BUN  --  14  CREATININE 0.78 0.85  GLUCOSE  --  125*  CALCIUM  --  8.3*   Lab Results  Component Value Date   INR 1.04 02/11/2016   INR 1.1* 01/13/2016     Recent Radiographic Studies :  Mm Digital Screening  February 24, 2016  CLINICAL DATA:  Screening. EXAM: DIGITAL SCREENING BILATERAL MAMMOGRAM WITH CAD COMPARISON:  None. ACR Breast Density Category b: There are scattered areas of fibroglandular density. FINDINGS: There are no findings suspicious for  malignancy. Images were processed with CAD. IMPRESSION: No mammographic evidence of malignancy. A result letter of this screening mammogram will be mailed directly to the patient. RECOMMENDATION: Screening mammogram in one year. (Code:SM-B-01Y) BI-RADS CATEGORY  1: Negative. Electronically Signed   By: Abelardo Diesel M.D.   On: 24-Feb-2016 11:31    DISCHARGE INSTRUCTIONS:   DISCHARGE MEDICATIONS:     Medication List    STOP taking these medications        glucosamine-chondroitin 500-400 MG tablet     naproxen sodium 220 MG tablet  Commonly known as:  ANAPROX      TAKE these medications        amitriptyline 10 MG tablet  Commonly known as:  ELAVIL  Take 1-2 tablets (10-20 mg total) by mouth at bedtime as needed for sleep.     aspirin EC 81 MG tablet  Take 81 mg by mouth daily.     BIOTIN 5000 5 MG Caps  Generic drug:  Biotin  Take by mouth.     CALCIUM 1200 PO  Take 1 tablet by mouth every morning. 600mg  calcium and 500mg  vitamin D each tablet     cycloSPORINE 0.05 % ophthalmic emulsion  Commonly known as:  RESTASIS  Place 1 drop into both eyes every 12 (twelve) hours.     enoxaparin 40 MG/0.4ML injection  Commonly known as:  LOVENOX  Inject 0.4 mLs (40 mg total) into the skin daily.     levothyroxine 25 MCG tablet  Commonly known as:  SYNTHROID, LEVOTHROID  TAKE 1 TABLET(25 MCG) BY MOUTH DAILY     Magnesium 250 MG Tabs  Take 250 mg by mouth daily.     methocarbamol 500 MG tablet  Commonly known as:  ROBAXIN  Take 1-2 tablets (500-1,000 mg total) by mouth every 6 (six) hours as needed for muscle spasms.     metoprolol succinate 25 MG 24 hr tablet  Commonly known as:  TOPROL-XL  Take 1 tablet (25 mg total) by mouth daily.     omeprazole 20 MG capsule  Commonly known as:  PRILOSEC  Take 20 mg by mouth daily.     ondansetron 4 MG tablet  Commonly known as:  ZOFRAN  Take 1 tablet (4 mg total) by mouth every  6 (six) hours as needed for nausea.     ONE-A-DAY  EXTRAS ANTIOXIDANT Caps  Take 1 capsule by mouth daily.     oxyCODONE 5 MG immediate release tablet  Commonly known as:  Oxy IR/ROXICODONE  Take 1-2 tablets (5-10 mg total) by mouth every 3 (three) hours as needed for breakthrough pain.     oxyCODONE 10 mg 12 hr tablet  Commonly known as:  OXYCONTIN  Take 1 tablet (10 mg total) by mouth every 12 (twelve) hours.     VITAMIN B-12 CR PO  Take 1 tablet by mouth every Monday, Wednesday, and Friday.     Vitamin D3 2000 units Tabs  Take 2,000 Units by mouth daily.        FOLLOW UP VISIT:    DISPOSITION: HOME VS. SNF  CONDITION:  Good   Donia Ast 02/23/2016, 4:26 PM

## 2016-02-23 NOTE — Progress Notes (Signed)
Physical Therapy Treatment Patient Details Name: Rebekah Valentine MRN: [id number] DOB: November 24, 1945 Today's Date: 02/23/2016    History of Present Illness 70 y.o. female admitted to [facility] on 02/22/16 for elective R TKA.  Pt with no significant PMHx.     PT Comments    Pt performed increased mobility as evident by increased distance and addition of therapeutic exercise in seated position.  Pt ready for d/c, may d/c late this pm or am.  Will continue to follow acutely during hospitalization.    Follow Up Recommendations  Home health PT;Supervision for mobility/OOB     Equipment Recommendations  Rolling walker with 5" wheels    Recommendations for Other Services       Precautions / Restrictions Precautions Precautions: Knee Precaution Booklet Issued: Yes (comment) Precaution Comments: knee exercise handout given Restrictions Weight Bearing Restrictions: No RLE Weight Bearing: Weight bearing as tolerated    Mobility  Bed Mobility Overal bed mobility: Modified Independent                Transfers Overall transfer level: Needs assistance Equipment used: Rolling walker (2 wheeled) Transfers: Sit to/from Stand Sit to Stand: Supervision         General transfer comment: Pt required cues for hand placement before pushing from seated surface.    Ambulation/Gait Ambulation/Gait assistance: Supervision Ambulation Distance (Feet): 250 Feet Assistive device: Rolling walker (2 wheeled) Gait Pattern/deviations: Step-through pattern;Antalgic;Decreased stride length   Gait velocity interpretation: Below normal speed for age/gender General Gait Details: Pt presents with improved gait speed.  No c.o nausea and able to advance gait distance.  Pt tolerated tx well.     Stairs            Wheelchair Mobility    Modified Rankin (Stroke Patients Only)       Balance Overall balance assessment: Needs assistance   Sitting balance-Leahy Scale: Good       Standing  balance-Leahy Scale: Fair                      Cognition Arousal/Alertness: Awake/alert Behavior During Therapy: WFL for tasks assessed/performed Overall Cognitive Status: Within Functional Limits for tasks assessed                      Exercises Total Joint Exercises Ankle Circles/Pumps: AROM;Both;10 reps;Supine Quad Sets: AROM;Right;10 reps;Supine Gluteal Sets: AROM;Right;10 reps;Supine Short Arc Quad: AAROM;Right;10 reps;Supine Heel Slides: AAROM;Right;10 reps;Supine Hip ABduction/ADduction: AAROM;Right;10 reps;Supine Straight Leg Raises: AAROM;Right;10 reps;Supine Knee Flexion: AROM;AAROM;Right;10 reps (Pt performed 1x10 reps AROM and 1x10 reps AAROM.  ) Goniometric ROM: 6-84 degrees in R knee.      General Comments        Pertinent Vitals/Pain Pain Assessment: 0-10 Pain Score: 6  Pain Location: R knee after exercise.   Pain Descriptors / Indicators: Grimacing;Guarding;Discomfort Pain Intervention(s): Monitored during session;Repositioned;Ice applied    Home Living                      Prior Function            PT Goals (current goals can now be found in the care plan section) Acute Rehab PT Goals Patient Stated Goal: get better and go home Potential to Achieve Goals: Good Progress towards PT goals: Progressing toward goals    Frequency  7X/week    PT Plan Current plan remains appropriate    Co-evaluation  End of Session Equipment Utilized During Treatment: Gait belt Activity Tolerance: Patient limited by pain;Other (comment) (nausea) Patient left: in chair;with call bell/phone within reach     Time: 1535-1605 PT Time Calculation (min) (ACUTE ONLY): 30 min  Charges:  $Gait Training: 8-22 mins $Therapeutic Exercise: 8-22 mins                    G Codes:      Cristela Blue 02-27-2016, 4:19 PM  Governor Rooks, PTA pager [phone]

## 2016-02-23 NOTE — Progress Notes (Signed)
Pt given oxy IR at 1548 for pain rated 7/10 and given zofran at 1623 at which time patient stated her pain was better- 5/10.

## 2016-02-23 NOTE — Care Management Important Message (Signed)
Important Message  Patient Details  Name: Rebekah Valentine MRN: [id number] Date of Birth: 16-Mar-1946   Medicare Important Message Given:  Yes    Loann Quill 02/23/2016, 8:27 AM

## 2016-02-23 NOTE — Evaluation (Signed)
Occupational Therapy Evaluation Patient Details Name: Rebekah Valentine MRN: [id number] DOB: 1945/09/22 Today's Date: 02/23/2016    History of Present Illness 70 y.o. female admitted to [facility] on 02/22/16 for elective R TKA.  Pt with no significant PMHx.    Clinical Impression   Pt with decline in function with ADLs and ADL mobility with decreased endurance and balance. Pt would benefit from acute OT services to address impairments to increase level of function and safety    Follow Up Recommendations  Home health OT;Supervision - Intermittent    Equipment Recommendations  3 in 1 bedside comode;Other (comment) Management consultant)    Recommendations for Other Services       Precautions / Restrictions Precautions Precautions: Knee Precaution Booklet Issued: Yes (comment) Precaution Comments: knee exercise handout given Restrictions Weight Bearing Restrictions: No RLE Weight Bearing: Weight bearing as tolerated      Mobility Bed Mobility Overal bed mobility: Modified Independent                Transfers Overall transfer level: Needs assistance Equipment used: Rolling walker (2 wheeled) Transfers: Sit to/from Stand Sit to Stand: Supervision         General transfer comment: Pt required cues for hand placement before pushing from seated surface.  Pt required cues tio stay seated to allow PTA to adjust RW for height.      Balance Overall balance assessment: Needs assistance Sitting-balance support: No upper extremity supported;Feet supported Sitting balance-Leahy Scale: Good     Standing balance support: Bilateral upper extremity supported;During functional activity Standing balance-Leahy Scale: Fair                              ADL Overall ADL's : Needs assistance/impaired     Grooming: Wash/dry hands;Wash/dry face;Standing;Supervision/safety   Upper Body Bathing: Set up;Sitting   Lower Body Bathing: Moderate assistance;Sitting/lateral leans;Sit to/from  stand   Upper Body Dressing : Set up;Sitting   Lower Body Dressing: Moderate assistance;Sitting/lateral leans;Sit to/from stand   Toilet Transfer: Supervision/safety;RW;Grab bars Armed forces technical officer Details (indicate cue type and reason): 3 in 1 over toilet Toileting- Clothing Manipulation and Hygiene: Min guard   Tub/ Shower Transfer: 3 in 1;Supervision/safety;Ambulation;Rolling walker Tub/Shower Transfer Details (indicate cue type and reason): pt educated in using 3 in 1 for shower seating at home Functional mobility during ADLs: Supervision/safety;Rolling walker General ADL Comments: Pt declined A/E education and demo. Pt educated on proper sequencing of LB dressing and use of reacher for home     Vision  wears reading glasses, no change from baseline              Pertinent Vitals/Pain Pain Assessment: 0-10 Pain Score: 6  Pain Location: R knee after therapeutic exercise.   Pain Descriptors / Indicators: Grimacing;Guarding;Discomfort Pain Intervention(s): Monitored during session;Repositioned;Ice applied     Hand Dominance Right   Extremity/Trunk Assessment Upper Extremity Assessment Upper Extremity Assessment: Overall WFL for tasks assessed   Lower Extremity Assessment Lower Extremity Assessment: Defer to PT evaluation   Cervical / Trunk Assessment Cervical / Trunk Assessment: Normal   Communication Communication Communication: No difficulties   Cognition Arousal/Alertness: Awake/alert Behavior During Therapy: WFL for tasks assessed/performed Overall Cognitive Status: Within Functional Limits for tasks assessed                     General Comments   pt very pleasant and cooperative  Home Living Family/patient expects to be discharged to:: Private residence Living Arrangements: Spouse/significant other Available Help at Discharge: Family Type of Home: House Home Access: Level entry     [place]: One level     Bathroom  Shower/Tub: Occupational psychologist: Standard                Prior Functioning/Environment Level of Independence: Independent             OT Diagnosis: Acute pain   OT Problem List: Pain;Decreased activity tolerance;Impaired balance (sitting and/or standing);Decreased knowledge of use of DME or AE   OT Treatment/Interventions: Self-care/ADL training;Therapeutic activities;DME and/or AE instruction;Patient/family education    OT Goals(Current goals can be found in the care plan section) Acute Rehab OT Goals Patient Stated Goal: get better and go home OT Goal Formulation: With patient ADL Goals Pt Will Perform Lower Body Bathing: with min assist;sitting/lateral leans;sit to/from stand Pt Will Perform Lower Body Dressing: with min assist;with adaptive equipment;sitting/lateral leans;sit to/from stand Pt Will Perform Toileting - Clothing Manipulation and hygiene: with supervision;sitting/lateral leans;sit to/from stand Pt Will Perform Tub/Shower Transfer: with supervision;3 in 1  OT Frequency: Min 2X/week   Barriers to D/C:  none                        End of Session Equipment Utilized During Treatment: Rolling walker;Other (comment) (3 in 1) CPM Right Knee CPM Right Knee: Off  Activity Tolerance: Patient tolerated treatment well Patient left: in bed;with call bell/phone within reach   Time: 0935-1004 OT Time Calculation (min): 29 min Charges:  OT General Charges $OT Visit: 1 Procedure OT Evaluation $OT Eval Moderate Complexity: 1 Procedure OT Treatments $Self Care/Home Management : 8-22 mins G-Codes:    Britt Bottom 02/23/2016, 10:46 AM

## 2016-03-04 ENCOUNTER — Other Ambulatory Visit: Payer: Self-pay | Admitting: Family

## 2016-03-04 NOTE — Telephone Encounter (Signed)
Rx sent to the pharmacy by e-script.//AB/CMA 

## 2016-04-08 ENCOUNTER — Other Ambulatory Visit: Payer: Self-pay | Admitting: Family

## 2016-04-30 ENCOUNTER — Other Ambulatory Visit: Payer: Self-pay | Admitting: Family

## 2016-05-30 ENCOUNTER — Other Ambulatory Visit: Payer: Self-pay | Admitting: Family

## 2016-07-27 ENCOUNTER — Ambulatory Visit (INDEPENDENT_AMBULATORY_CARE_PROVIDER_SITE_OTHER): Payer: Medicare Other | Admitting: Family

## 2016-07-27 ENCOUNTER — Encounter: Payer: Self-pay | Admitting: Family

## 2016-07-27 VITALS — BP 152/78 | HR 76 | Temp 97.9°F | Resp 18 | Ht 63.0 in | Wt 168.0 lb

## 2016-07-27 DIAGNOSIS — G4700 Insomnia, unspecified: Secondary | ICD-10-CM

## 2016-07-27 DIAGNOSIS — K219 Gastro-esophageal reflux disease without esophagitis: Secondary | ICD-10-CM

## 2016-07-27 DIAGNOSIS — I10 Essential (primary) hypertension: Secondary | ICD-10-CM

## 2016-07-27 DIAGNOSIS — Z23 Encounter for immunization: Secondary | ICD-10-CM

## 2016-07-27 DIAGNOSIS — E039 Hypothyroidism, unspecified: Secondary | ICD-10-CM

## 2016-07-27 LAB — BASIC METABOLIC PANEL
BUN: 23 mg/dL (ref 6–23)
CALCIUM: 9.6 mg/dL (ref 8.4–10.5)
CO2: 33 meq/L — AB (ref 19–32)
Chloride: 104 mEq/L (ref 96–112)
Creatinine, Ser: 0.97 mg/dL (ref 0.40–1.20)
GFR: 60.03 mL/min (ref >60.00)
GLUCOSE: 90 mg/dL (ref 70–99)
POTASSIUM: 4.9 meq/L (ref 3.5–5.1)
SODIUM: 142 meq/L (ref 135–145)

## 2016-07-27 LAB — TSH: TSH: 2.84 u[IU]/mL (ref 0.35–4.50)

## 2016-07-27 MED ORDER — METOPROLOL SUCCINATE ER 25 MG PO TB24: ORAL_TABLET | ORAL | 1 refills | Status: DC

## 2016-07-27 MED ORDER — AMITRIPTYLINE HCL 25 MG PO TABS: 25.0000 mg | ORAL_TABLET | Freq: Every day | ORAL | 1 refills | Status: DC

## 2016-07-27 NOTE — Progress Notes (Signed)
Subjective:    Patient ID: Rebekah Valentine, female    DOB: 1946/03/20, 70 y.o.   MRN: [id number]  HPI  Rebekah Valentine is a 70 yr old female who presents today for follow up.  1) HTN-  Currently maintained on toprol xl 25mg  once daily.  Reports home bp's 120's/70's.  BP Readings from Last 3 Encounters:  07/27/16 (!) 155/69  02/23/16 (!) 146/61  02/11/16 (!) 142/73   2) Hypothyroid-  Maintained on synthroid.  Feeling well on dose.   Lab Results  Component Value Date   TSH 2.39 01/26/2016   3) GERD- maintained on omeprazole. Symptoms are well controlled. She is working on weight loss and exercising more since her TKA a few months back.   Wt Readings from Last 3 Encounters:  07/27/16 168 lb (76.2 kg)  02/22/16 179 lb (81.2 kg)  02/11/16 179 lb (81.2 kg)     4) insomnia- not sleeping well despite use of elavil.  Insurance would not cover [employer].    Review of Systems See HPI  Past Medical History:  Diagnosis Date  . Arthritis   . Breast cyst    recurrent  . COLONIC POLYPS, HX OF 11/2007  . GERD (gastroesophageal reflux disease)   . HEMORRHOIDS   . HYPERTENSION   . Hypothyroidism   . INSOMNIA   . Knee pain    Arthritis   . SKIN CANCER, HX OF   . TROCHANTERIC BURSITIS, RIGHT      Social History   Social History  . Marital status: Married    Spouse name: N/A  . Number of children: N/A  . Years of education: N/A   Occupational History  . Not on file.   Social History Main Topics  . Smoking status: Never Smoker  . Smokeless tobacco: Never Used     Comment: Married, lives with spouse. Retired asst. teacher-subs now [employer]  . Alcohol use 0.6 - 1.2 oz/week    1 - 2 Glasses of wine per week     Comment: occassional glass of wine  . Drug use: No  . Sexual activity: No   Other Topics Concern  . 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 BASAL CELL CARCINOMA EXCISION     face  . colon polpectomy  11/2007   Dr. Fuller Plan  . HYSTEROSCOPY     polyp  . Lumpectomy Right 1990   breast  . TONSILLECTOMY  1957  . TONSILLECTOMY AND ADENOIDECTOMY    . TOTAL KNEE ARTHROPLASTY Right 02/22/2016   Procedure: RIGHT TOTAL KNEE ARTHROPLASTY;  Surgeon: Vickey Huger, MD;  Location: [place];  Service: Orthopedics;  Laterality: Right;    Family History  Problem Relation Age of Onset  . Arthritis Father   . Coronary artery disease Father   . Hypertension Father   . Heart disease Father   . Breast cancer Paternal Aunt   . Diabetes Paternal Grandmother   . COPD Sister     smoker  . Blindness Sister     since birth  . Congestive Heart Failure Sister   . COPD Mother     smoker  . Colon cancer Neg Hx     No Known Allergies  Current Outpatient Prescriptions on File Prior to  Visit  Medication Sig Dispense Refill  . amitriptyline (ELAVIL) 10 MG tablet TAKE 1-2 TABLETS BY MOUTH EVERY NIGHT AT BEDTIME AS NEEDED FOR SLEEP 180 tablet 1  . aspirin EC 81 MG tablet Take 81 mg by mouth 3 (three) times a week.     . Biotin (BIOTIN 5000) 5 MG CAPS Take by mouth.    . Calcium Carbonate-Vit D-Min (CALCIUM 1200 PO) Take 1 tablet by mouth every morning. 600mg  calcium and 500mg  vitamin D each tablet    . Cholecalciferol (VITAMIN D3) 2000 UNITS TABS Take 2,000 Units by mouth daily.     . Cyanocobalamin (VITAMIN B-12 CR PO) Take 1 tablet by mouth every Monday, Wednesday, and Friday.     . cycloSPORINE (RESTASIS) 0.05 % ophthalmic emulsion Place 1 drop into both eyes every 12 (twelve) hours.     Marland Kitchen levothyroxine (SYNTHROID, LEVOTHROID) 25 MCG tablet TAKE 1 TABLET(25 MCG) BY MOUTH DAILY 90 tablet 1  . Magnesium 250 MG TABS Take 250 mg by mouth daily.     . metoprolol succinate (TOPROL-XL) 25 MG 24 hr tablet TAKE 1 TABLET(25 MG) BY MOUTH DAILY 30 tablet 5  . Multiple Vitamins-Minerals  (ONE-A-DAY EXTRAS ANTIOXIDANT) CAPS Take 1 capsule by mouth daily.     Marland Kitchen omeprazole (PRILOSEC) 20 MG capsule TAKE 1 CAPSULE BY MOUTH EVERY DAY 90 capsule 1   No current facility-administered medications on file prior to visit.     BP (!) 155/69 (BP Location: Right Arm, Cuff Size: Normal)   Pulse 76   Temp 97.9 F (36.6 C) (Oral)   Resp 18   Ht 5\' 3"  (1.6 m)   Wt 168 lb (76.2 kg)   SpO2 100% Comment: room air  BMI 29.76 kg/m       Objective:   Physical Exam  Constitutional: She is oriented to person, place, and time. She appears well-developed and well-nourished.  HENT:  Head: Normocephalic and atraumatic.  Cardiovascular: Normal rate, regular rhythm and normal heart sounds.   No murmur heard. Pulmonary/Chest: Effort normal and breath sounds normal. No respiratory distress. She has no wheezes.  Musculoskeletal: She exhibits no edema.  Neurological: She is alert and oriented to person, place, and time.  Skin: Skin is warm and dry.  Psychiatric: She has a normal mood and affect. Her behavior is normal. Judgment and thought content normal.          Assessment & Plan:

## 2016-07-27 NOTE — Assessment & Plan Note (Signed)
She has lost some weight. We discussed trial off of omeprazole to see if she can tolerate.

## 2016-07-27 NOTE — Progress Notes (Signed)
Pre visit review using our clinic review tool, if applicable. No additional management support is needed unless otherwise documented below in the visit note. 

## 2016-07-27 NOTE — Assessment & Plan Note (Signed)
Appears to have some mild white coat HTN. Advised her to continue current dose of metoprolol and monitor bp's at home. Let me know if home bp's are running >150/90's.  She verbalizes understanding.

## 2016-07-27 NOTE — Assessment & Plan Note (Signed)
Clinically stable on synthroid, obtain follow up tsh.  

## 2016-07-27 NOTE — Assessment & Plan Note (Signed)
Uncontrolled. Will increase elavil from 10mg  to 25mg .

## 2016-07-27 NOTE — Patient Instructions (Addendum)
Please complete lab work prior to leaving. Continue to monitor your blood pressure at home and call if you see consistent blood pressures >150/90. Increase your amitryptilline to 25mg  at bedtime.

## 2016-08-30 ENCOUNTER — Other Ambulatory Visit: Payer: Self-pay | Admitting: Family

## 2016-10-31 ENCOUNTER — Ambulatory Visit: Payer: Medicare Other | Admitting: Family

## 2016-11-27 ENCOUNTER — Other Ambulatory Visit: Payer: Self-pay | Admitting: Family

## 2016-11-29 ENCOUNTER — Ambulatory Visit: Payer: Medicare Other | Admitting: Family

## 2017-01-24 ENCOUNTER — Other Ambulatory Visit: Payer: Self-pay | Admitting: Family

## 2017-01-24 NOTE — Telephone Encounter (Signed)
eScribe request from [facility] for refill on Amitriptyline 25 mg tab eScribe request from [facility] for refill on Metoprolol 25 mg tab Last filled - 07/27/16, #[id number] Last AEX - 07/27/16 Next AEX - 3-Mths [patient Cancel 10/31/16, No show 11/29/16]; patient has appointment scheduled 01/25/17 Requested drug refills are authorized 30-day Only, however, the patient needs further evaluation and/or laboratory testing before further refills are given. Ask her to make an appointment for this/SLS 05/08

## 2017-01-25 ENCOUNTER — Encounter: Payer: Self-pay | Admitting: Family

## 2017-01-25 ENCOUNTER — Ambulatory Visit (INDEPENDENT_AMBULATORY_CARE_PROVIDER_SITE_OTHER): Payer: Medicare Other | Admitting: Family

## 2017-01-25 VITALS — BP 132/71 | HR 77 | Temp 98.1°F | Resp 16 | Ht 63.0 in | Wt 172.4 lb

## 2017-01-25 DIAGNOSIS — I10 Essential (primary) hypertension: Secondary | ICD-10-CM

## 2017-01-25 DIAGNOSIS — E039 Hypothyroidism, unspecified: Secondary | ICD-10-CM | POA: Diagnosis not present

## 2017-01-25 DIAGNOSIS — G4700 Insomnia, unspecified: Secondary | ICD-10-CM

## 2017-01-25 LAB — BASIC METABOLIC PANEL
BUN: 19 mg/dL (ref 6–23)
CALCIUM: 9.3 mg/dL (ref 8.4–10.5)
CHLORIDE: 105 meq/L (ref 96–112)
CO2: 31 meq/L (ref 19–32)
CREATININE: 1.05 mg/dL (ref 0.40–1.20)
GFR: 54.71 mL/min — ABNORMAL LOW (ref >60.00)
GLUCOSE: 72 mg/dL (ref 70–99)
POTASSIUM: 4.3 meq/L (ref 3.5–5.1)
SODIUM: 142 meq/L (ref 135–145)

## 2017-01-25 MED ORDER — LEVOTHYROXINE SODIUM 25 MCG PO TABS: ORAL_TABLET | ORAL | 1 refills | Status: DC

## 2017-01-25 MED ORDER — METOPROLOL SUCCINATE ER 25 MG PO TB24: ORAL_TABLET | ORAL | 1 refills | Status: DC

## 2017-01-25 MED ORDER — OMEPRAZOLE 20 MG PO CPDR: DELAYED_RELEASE_CAPSULE | ORAL | 1 refills | Status: DC

## 2017-01-25 MED ORDER — AMITRIPTYLINE HCL 25 MG PO TABS: ORAL_TABLET | ORAL | 1 refills | Status: DC

## 2017-01-25 NOTE — Patient Instructions (Signed)
Please complete lab work prior to leaving.   

## 2017-01-25 NOTE — Assessment & Plan Note (Signed)
Improved on increased dose of elavil. Continue same.

## 2017-01-25 NOTE — Progress Notes (Signed)
Subjective:    Patient ID: Rebekah Valentine, female    DOB: [birth year], 71 y.o.   MRN: [id number]  HPI  Rebekah Valentine is a 71 yr old female who presents today for follow up.  1) Hypothyroid- maintained on synthroid.  Lab Results  Component Value Date   TSH 2.84 07/27/2016   2) HTN- BP was elevated last visit.  BP Readings from Last 3 Encounters:  01/25/17 132/71  07/27/16 (!) 152/78  02/23/16 (!) 146/61   3) Insomnia- Last visit insomnia was uncontrolled. Elavil was increased from 10mg  to 25mg .   4) GERD- last visit we discussed trial off of omeprazole. Was unable to tolerate discontinuing due to recurrence of gerd symptoms.   Review of Systems See HPI  Past Medical History:  Diagnosis Date  . Arthritis   . Breast cyst    recurrent  . COLONIC POLYPS, HX OF 11/2007  . GERD (gastroesophageal reflux disease)   . HEMORRHOIDS   . HYPERTENSION   . Hypothyroidism   . INSOMNIA   . Knee pain    Arthritis   . SKIN CANCER, HX OF   . TROCHANTERIC BURSITIS, RIGHT      Social History   Social History  . Marital status: Married    Spouse name: N/A  . Number of children: N/A  . Years of education: N/A   Occupational History  . Not on file.   Social History Main Topics  . Smoking status: Never Smoker  . Smokeless tobacco: Never Used     Comment: Married, lives with spouse. Retired asst. teacher-subs now [employer]  . Alcohol use 0.6 - 1.2 oz/week    1 - 2 Glasses of wine per week     Comment: occassional glass of wine  . Drug use: No  . Sexual activity: No   Other Topics Concern  . 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 BASAL CELL CARCINOMA EXCISION     face  . colon polpectomy  11/2007   Dr. Fuller Plan  . HYSTEROSCOPY     polyp  . Lumpectomy Right 1990   breast  .  TONSILLECTOMY  1957  . TONSILLECTOMY AND ADENOIDECTOMY    . TOTAL KNEE ARTHROPLASTY Right 02/22/2016   Procedure: RIGHT TOTAL KNEE ARTHROPLASTY;  Surgeon: Vickey Huger, MD;  Location: [facility];  Service: Orthopedics;  Laterality: Right;    Family History  Problem Relation Age of Onset  . Arthritis Father   . Coronary artery disease Father   . Hypertension Father   . Heart disease Father   . Breast cancer Paternal Aunt   . Diabetes Paternal Grandmother   . COPD Sister     smoker  . Blindness Sister     since birth  . Congestive Heart Failure Sister   . COPD Mother     smoker  . Colon cancer Neg Hx     No Known Allergies  Current Outpatient Prescriptions on File Prior to Visit  Medication Sig Dispense Refill  . amitriptyline (ELAVIL) 25 MG tablet TAKE 1 TABLET(25 MG) BY MOUTH AT BEDTIME 30 tablet 0  . aspirin EC 81 MG tablet Take 81 mg by mouth 3 (three) times a week.     . Biotin (BIOTIN 5000) 5  MG CAPS Take by mouth.    . Calcium Carbonate-Vit D-Min (CALCIUM 1200 PO) Take 1 tablet by mouth every morning. 600mg  calcium and 500mg  vitamin D each tablet    . Cholecalciferol (VITAMIN D3) 2000 UNITS TABS Take 2,000 Units by mouth daily.     . Cyanocobalamin (VITAMIN B-12 CR PO) Take 1 tablet by mouth every Monday, Wednesday, and Friday.     . cycloSPORINE (RESTASIS) 0.05 % ophthalmic emulsion Place 1 drop into both eyes every 12 (twelve) hours.     Marland Kitchen levothyroxine (SYNTHROID, LEVOTHROID) 25 MCG tablet TAKE 1 TABLET(25 MCG) BY MOUTH DAILY 90 tablet 0  . Magnesium 250 MG TABS Take 250 mg by mouth daily.     . metoprolol succinate (TOPROL-XL) 25 MG 24 hr tablet TAKE 1 TABLET(25 MG) BY MOUTH DAILY 30 tablet 0  . Multiple Vitamins-Minerals (ONE-A-DAY EXTRAS ANTIOXIDANT) CAPS Take 1 capsule by mouth daily.     Marland Kitchen omeprazole (PRILOSEC) 20 MG capsule TAKE 1 CAPSULE BY MOUTH EVERY DAY 90 capsule 1   No current facility-administered medications on file prior to visit.     BP 132/71 (BP Location:  Left Arm, Cuff Size: Large)   Pulse 77   Temp 98.1 F (36.7 C) (Oral)   Resp 16   Ht 5\' 3"  (1.6 m)   Wt 172 lb 6.4 oz (78.2 kg)   SpO2 100% Comment: room air  BMI 30.54 kg/m       Objective:   Physical Exam  Constitutional: She is oriented to person, place, and time. She appears well-developed and well-nourished.  Cardiovascular: Normal rate, regular rhythm and normal heart sounds.   No murmur heard. Pulmonary/Chest: Effort normal and breath sounds normal. No respiratory distress. She has no wheezes.  Musculoskeletal: She exhibits no edema.  Neurological: She is alert and oriented to person, place, and time.  Psychiatric: She has a normal mood and affect. Her behavior is normal. Judgment and thought content normal.          Assessment & Plan:

## 2017-01-25 NOTE — Assessment & Plan Note (Signed)
BP looks good today. Continue beta blocker. Obtain follow up bmet.

## 2017-01-25 NOTE — Assessment & Plan Note (Addendum)
Clinically stable on synthroid. Continue same. Obtain follow up TSH.

## 2017-01-25 NOTE — Progress Notes (Signed)
Pre visit review using our clinic review tool, if applicable. No additional management support is needed unless otherwise documented below in the visit note. 

## 2017-01-26 LAB — TSH: TSH: 2.95 u[IU]/mL (ref 0.35–4.50)

## 2017-01-27 ENCOUNTER — Encounter: Payer: Self-pay | Admitting: *Deleted

## 2017-02-21 ENCOUNTER — Other Ambulatory Visit: Payer: Self-pay | Admitting: Family

## 2017-02-28 ENCOUNTER — Other Ambulatory Visit: Payer: Self-pay | Admitting: Family

## 2017-05-30 ENCOUNTER — Ambulatory Visit (INDEPENDENT_AMBULATORY_CARE_PROVIDER_SITE_OTHER): Payer: Medicare Other

## 2017-05-30 DIAGNOSIS — Z23 Encounter for immunization: Secondary | ICD-10-CM | POA: Diagnosis not present

## 2017-08-02 ENCOUNTER — Telehealth: Payer: Self-pay | Admitting: Family

## 2017-08-02 NOTE — Telephone Encounter (Signed)
Called patient to schedule AWV. Left Vm for Pt to call office to schedule appt.   *Last AWV 12/15/2015. Patient can schedule appt at anytime. SF

## 2017-08-21 ENCOUNTER — Other Ambulatory Visit: Payer: Self-pay | Admitting: Family

## 2017-08-21 NOTE — Telephone Encounter (Signed)
Amitriptyline and Metoprolol refill sent to pharmacy. Pt was due for follow up with Melissa in November. Please call pt to schedule appt soon. Thanks!

## 2017-08-22 NOTE — Telephone Encounter (Signed)
Called pt and left voicemail to schedule appt for any further refills.

## 2017-08-28 ENCOUNTER — Ambulatory Visit: Payer: Medicare Other | Admitting: Family

## 2017-08-31 ENCOUNTER — Other Ambulatory Visit: Payer: Self-pay | Admitting: Family

## 2017-09-05 ENCOUNTER — Ambulatory Visit: Payer: Medicare Other | Admitting: Family

## 2017-09-05 ENCOUNTER — Encounter: Payer: Self-pay | Admitting: Family

## 2017-09-05 ENCOUNTER — Telehealth: Payer: Self-pay | Admitting: Family

## 2017-09-05 VITALS — BP 146/59 | HR 77 | Temp 98.3°F | Resp 16 | Ht 63.0 in | Wt 178.2 lb

## 2017-09-05 DIAGNOSIS — E039 Hypothyroidism, unspecified: Secondary | ICD-10-CM

## 2017-09-05 DIAGNOSIS — I10 Essential (primary) hypertension: Secondary | ICD-10-CM | POA: Diagnosis not present

## 2017-09-05 DIAGNOSIS — Z23 Encounter for immunization: Secondary | ICD-10-CM

## 2017-09-05 LAB — BASIC METABOLIC PANEL
BUN: 20 mg/dL (ref 6–23)
CALCIUM: 9.1 mg/dL (ref 8.4–10.5)
CO2: 30 mEq/L (ref 19–32)
Chloride: 103 mEq/L (ref 96–112)
Creatinine, Ser: 0.82 mg/dL (ref 0.40–1.20)
GFR: 72.65 mL/min (ref >60.00)
GLUCOSE: 90 mg/dL (ref 70–99)
POTASSIUM: 4.3 meq/L (ref 3.5–5.1)
SODIUM: 141 meq/L (ref 135–145)

## 2017-09-05 LAB — TSH: TSH: 5.51 u[IU]/mL — AB (ref 0.35–4.50)

## 2017-09-05 MED ORDER — ZOSTER VAC RECOMB ADJUVANTED 50 MCG/0.5ML IM SUSR: INTRAMUSCULAR | 1 refills | Status: DC

## 2017-09-05 MED ORDER — LEVOTHYROXINE SODIUM 50 MCG PO TABS: 50.0000 ug | ORAL_TABLET | Freq: Every day | ORAL | 2 refills | Status: DC

## 2017-09-05 NOTE — Patient Instructions (Signed)
Please complete lab work prior to leaving. You can begin the shingles vaccine series at your pharmacy.

## 2017-09-05 NOTE — Assessment & Plan Note (Signed)
Stable on PPI, continue same.  

## 2017-09-05 NOTE — Telephone Encounter (Signed)
Lab work shows that synthroid needs to be increased. I will increase from 63mcg to 49mcg. Repeat tsh in 6 weeks.   Kidney function and electrolytes look good.

## 2017-09-05 NOTE — Assessment & Plan Note (Signed)
Maintained on metoprolol xl 25mg .  BP acceptable for her age.

## 2017-09-05 NOTE — Assessment & Plan Note (Signed)
Stable on synthroid, continue same.   ?

## 2017-09-05 NOTE — Assessment & Plan Note (Signed)
Sleeping well on elavil.

## 2017-09-05 NOTE — Progress Notes (Signed)
Subjective:    Patient ID: [name], female    DOB: [birth year], 71 y.o.   MRN: [id number]  HPI  Ms. Heeg is a 71 yr old female who presents today for follow up. She lost her husband of 50+ years a few months back. She reports that she is sad but not depressed and is coping well with his loss.    1) Hypothyroid- maintained on synthroid 25 mcg. Feels good on this dose.  Lab Results  Component Value Date   TSH 2.95 01/25/2017   2) HTN- maintained on metoprolol 25mg .  Denies CP/SOB or swelling.  BP Readings from Last 3 Encounters:  09/05/17 (!) 146/59  01/25/17 132/71  07/27/16 (!) 152/78   3) Insomnia- maintained on elavil 25mg . Reports that she is sleeping well.   4) GERD- continues omeprazole.    Review of Systems See HPI  Past Medical History:  Diagnosis Date  . Arthritis   . Breast cyst    recurrent  . COLONIC POLYPS, HX OF 11/2007  . GERD (gastroesophageal reflux disease)   . HEMORRHOIDS   . HYPERTENSION   . Hypothyroidism   . INSOMNIA   . Knee pain    Arthritis   . SKIN CANCER, HX OF   . TROCHANTERIC BURSITIS, RIGHT      Social History   Socioeconomic History  . Marital status: Married    Spouse name: Not on file  . Number of children: Not on file  . Years of education: Not on file  . Highest education level: Not on file  Social Needs  . Financial resource strain: Not on file  . Food insecurity - worry: Not on file  . Food insecurity - inability: Not on file  . Transportation needs - medical: Not on file  . Transportation needs - non-medical: Not on file  Occupational History  . Not on file  Tobacco Use  . Smoking status: Never Smoker  . Smokeless tobacco: Never Used  . Tobacco comment: Married, lives with spouse. Retired asst. teacher-subs now [employer]  Substance and Sexual Activity  . Alcohol use: Yes    Alcohol/week: 0.6 - 1.2 oz    Types: 1 - 2 Glasses of wine per week    Comment: occassional glass of wine  . Drug use: No  . Sexual  activity: No  Other Topics Concern  . 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 BASAL CELL CARCINOMA EXCISION     face  . colon polpectomy  11/2007   Dr. Fuller Plan  . HYSTEROSCOPY     polyp  . Lumpectomy Right 1990   breast  . TONSILLECTOMY  1957  . TONSILLECTOMY AND ADENOIDECTOMY    . TOTAL KNEE ARTHROPLASTY Right 02/22/2016   Procedure: RIGHT TOTAL KNEE ARTHROPLASTY;  Surgeon: Vickey Huger, MD;  Location: [facility];  Service: Orthopedics;  Laterality: Right;    Family History  Problem Relation Age of Onset  . Arthritis Father   . Coronary artery disease Father   . Hypertension Father   . Heart disease Father   . Breast cancer Paternal Aunt   . Diabetes Paternal Grandmother   . COPD Sister        smoker  . Blindness Sister  since birth  . Congestive Heart Failure Sister   . COPD Mother        smoker  . Colon cancer Neg Hx     No Known Allergies  Current Outpatient Medications on File Prior to Visit  Medication Sig Dispense Refill  . amitriptyline (ELAVIL) 25 MG tablet TAKE 1 TABLET BY MOUTH AT BEDTIME 90 tablet 0  . aspirin EC 81 MG tablet Take 81 mg by mouth 3 (three) times a week.     . Biotin (BIOTIN 5000) 5 MG CAPS Take by mouth.    . Calcium Carbonate-Vit D-Min (CALCIUM 1200 PO) Take 1 tablet by mouth every morning. 600mg  calcium and 500mg  vitamin D each tablet    . Cholecalciferol (VITAMIN D3) 2000 UNITS TABS Take 2,000 Units by mouth daily.     . cycloSPORINE (RESTASIS) 0.05 % ophthalmic emulsion Place 1 drop into both eyes every 12 (twelve) hours.     Marland Kitchen levothyroxine (SYNTHROID, LEVOTHROID) 25 MCG tablet TAKE 1 TABLET(25 MCG) BY MOUTH DAILY 90 tablet 0  . metoprolol succinate (TOPROL-XL) 25 MG 24 hr tablet TAKE ONE TABLET BY MOUTH DAILY 90 tablet 0  . Multiple  Vitamins-Minerals (ONE-A-DAY EXTRAS ANTIOXIDANT) CAPS Take 1 capsule by mouth daily.     Marland Kitchen omeprazole (PRILOSEC) 20 MG capsule TAKE 1 CAPSULE BY MOUTH EVERY DAY 90 capsule 0   No current facility-administered medications on file prior to visit.     BP (!) 146/59 (BP Location: Right Arm, Cuff Size: Large)   Pulse 77   Temp 98.3 F (36.8 C) (Oral)   Resp 16   Ht 5\' 3"  (1.6 m)   Wt 178 lb 3.2 oz (80.8 kg)   SpO2 100%   BMI 31.57 kg/m       Objective:   Physical Exam  Constitutional: She is oriented to person, place, and time. She appears well-developed and well-nourished.  HENT:  Head: Normocephalic and atraumatic.  Cardiovascular: Normal rate, regular rhythm and normal heart sounds.  No murmur heard. Pulmonary/Chest: Effort normal and breath sounds normal. No respiratory distress. She has no wheezes.  Musculoskeletal: She exhibits no edema.  Neurological: She is alert and oriented to person, place, and time.  Skin: Skin is warm and dry.  Psychiatric: She has a normal mood and affect. Her behavior is normal. Judgment and thought content normal.          Assessment & Plan:

## 2017-09-06 ENCOUNTER — Telehealth: Payer: Self-pay | Admitting: Family

## 2017-09-06 NOTE — Telephone Encounter (Signed)
Patient advised of results, she will start taking her 23mcg 2 a day until she is out and will start 50 mcg after that. Scheduled for 10-16-17 for repeat TSH.

## 2017-10-16 ENCOUNTER — Other Ambulatory Visit (INDEPENDENT_AMBULATORY_CARE_PROVIDER_SITE_OTHER): Payer: Medicare Other

## 2017-10-16 ENCOUNTER — Encounter: Payer: Self-pay | Admitting: Family

## 2017-10-16 DIAGNOSIS — E039 Hypothyroidism, unspecified: Secondary | ICD-10-CM | POA: Diagnosis not present

## 2017-10-16 LAB — TSH: TSH: 2.01 u[IU]/mL (ref 0.35–4.50)

## 2017-10-24 ENCOUNTER — Ambulatory Visit (HOSPITAL_BASED_OUTPATIENT_CLINIC_OR_DEPARTMENT_OTHER)
Admission: RE | Admit: 2017-10-24 | Discharge: 2017-10-24 | Disposition: A | Discharge status: Home / Self Care | Payer: Medicare Other | Source: Ambulatory Visit | Attending: Family | Admitting: Family

## 2017-10-24 ENCOUNTER — Ambulatory Visit: Payer: Medicare Other | Admitting: Family

## 2017-10-24 ENCOUNTER — Encounter: Payer: Self-pay | Admitting: Family

## 2017-10-24 VITALS — BP 143/69 | HR 76 | Temp 98.2°F | Resp 16 | Ht 63.0 in | Wt 178.0 lb

## 2017-10-24 DIAGNOSIS — J40 Bronchitis, not specified as acute or chronic: Secondary | ICD-10-CM | POA: Diagnosis not present

## 2017-10-24 DIAGNOSIS — R05 Cough: Secondary | ICD-10-CM

## 2017-10-24 DIAGNOSIS — R059 Cough, unspecified: Secondary | ICD-10-CM

## 2017-10-24 DIAGNOSIS — I700 Atherosclerosis of aorta: Secondary | ICD-10-CM | POA: Diagnosis not present

## 2017-10-24 MED ORDER — AZITHROMYCIN 250 MG PO TABS: ORAL_TABLET | ORAL | 0 refills | Status: DC

## 2017-10-24 MED ORDER — BENZONATATE 100 MG PO CAPS: 100.0000 mg | ORAL_CAPSULE | Freq: Three times a day (TID) | ORAL | 0 refills | Status: DC | PRN

## 2017-10-24 NOTE — Progress Notes (Signed)
Subjective:    Patient ID: Rebekah Valentine, female    DOB: 01-Dec-1945, 72 y.o.   MRN: [id number]  HPI   Rebekah Valentine is a 72 yr old female who presents today with chief complaint of cough. Reports that cough is productive and is worse at night. Reports that symptoms began with rhinorrhea on 10/08/17.  Tried neti pot, mucinex.      Review of Systems    see HPI  Past Medical History:  Diagnosis Date  . Arthritis   . Breast cyst    recurrent  . COLONIC POLYPS, HX OF 11/2007  . GERD (gastroesophageal reflux disease)   . HEMORRHOIDS   . HYPERTENSION   . Hypothyroidism   . INSOMNIA   . Knee pain    Arthritis   . SKIN CANCER, HX OF   . TROCHANTERIC BURSITIS, RIGHT      Social History   Socioeconomic History  . Marital status: Married    Spouse name: Not on file  . Number of children: Not on file  . Years of education: Not on file  . Highest education level: Not on file  Social Needs  . Financial resource strain: Not on file  . Food insecurity - worry: Not on file  . Food insecurity - inability: Not on file  . Transportation needs - medical: Not on file  . Transportation needs - non-medical: Not on file  Occupational History  . Not on file  Tobacco Use  . Smoking status: Never Smoker  . Smokeless tobacco: Never Used  . Tobacco comment: Married, lives with spouse. Retired asst. teacher-subs now [employer]  Substance and Sexual Activity  . Alcohol use: Yes    Alcohol/week: 0.6 - 1.2 oz    Types: 1 - 2 Glasses of wine per week    Comment: occassional glass of wine  . Drug use: No  . Sexual activity: No  Other Topics Concern  . 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 BASAL CELL CARCINOMA EXCISION     face  . colon polpectomy  11/2007   Dr. Fuller Plan  . HYSTEROSCOPY     polyp  . Lumpectomy Right 1990   breast  . TONSILLECTOMY  1957  . TONSILLECTOMY AND ADENOIDECTOMY    . TOTAL KNEE ARTHROPLASTY Right 02/22/2016   Procedure: RIGHT TOTAL KNEE ARTHROPLASTY;  Surgeon: Vickey Huger, MD;  Location: [facility];  Service: Orthopedics;  Laterality: Right;    Family History  Problem Relation Age of Onset  . Arthritis Father   . Coronary artery disease Father   . Hypertension Father   . Heart disease Father   . Breast cancer Paternal Aunt   . Diabetes Paternal Grandmother   . COPD Sister        smoker  . Blindness Sister        since birth  . Congestive Heart Failure Sister   . COPD Mother        smoker  . Colon cancer Neg Hx     No Known Allergies  Current Outpatient Medications on File Prior to Visit  Medication Sig Dispense Refill  . amitriptyline (ELAVIL) 25 MG tablet TAKE 1 TABLET BY MOUTH AT BEDTIME 90 tablet 0  . aspirin EC 81 MG tablet  Take 81 mg by mouth 3 (three) times a week.     . Biotin (BIOTIN 5000) 5 MG CAPS Take by mouth.    . Calcium Carbonate-Vit D-Min (CALCIUM 1200 PO) Take 1 tablet by mouth every morning. 600mg  calcium and 500mg  vitamin D each tablet    . Cholecalciferol (VITAMIN D3) 2000 UNITS TABS Take 2,000 Units by mouth daily.     . cycloSPORINE (RESTASIS) 0.05 % ophthalmic emulsion Place 1 drop into both eyes every 12 (twelve) hours.     Marland Kitchen levothyroxine (SYNTHROID, LEVOTHROID) 50 MCG tablet Take 1 tablet (50 mcg total) by mouth daily before breakfast. 30 tablet 2  . metoprolol succinate (TOPROL-XL) 25 MG 24 hr tablet TAKE ONE TABLET BY MOUTH DAILY 90 tablet 0  . Multiple Vitamins-Minerals (ONE-A-DAY EXTRAS ANTIOXIDANT) CAPS Take 1 capsule by mouth daily.     Marland Kitchen omeprazole (PRILOSEC) 20 MG capsule TAKE 1 CAPSULE BY MOUTH EVERY DAY 90 capsule 0   No current facility-administered medications on file prior to visit.     BP (!) 143/69 (BP Location: Right Arm, Cuff Size: Normal)   Pulse 76   Temp 98.2 F (36.8 C) (Oral)   Resp 16    Ht 5\' 3"  (1.6 m)   Wt 178 lb (80.7 kg)   SpO2 100%   BMI 31.53 kg/m    Objective:   Physical Exam  Constitutional: She is oriented to person, place, and time. She appears well-developed and well-nourished.  HENT:  Head: Normocephalic and atraumatic.  Right Ear: Tympanic membrane and ear canal normal.  Left Ear: Tympanic membrane and ear canal normal.  Mouth/Throat: No oropharyngeal exudate, posterior oropharyngeal edema, posterior oropharyngeal erythema or tonsillar abscesses.  Cardiovascular: Normal rate, regular rhythm and normal heart sounds.  No murmur heard. Pulmonary/Chest: Effort normal and breath sounds normal. No respiratory distress. She has no wheezes.  Musculoskeletal: She exhibits no edema.  Lymphadenopathy:    She has no cervical adenopathy.  Neurological: She is alert and oriented to person, place, and time.  Skin: Skin is warm and dry.  Psychiatric: She has a normal mood and affect. Her behavior is normal. Judgment and thought content normal.          Assessment & Plan:  Bronchitis- will rx with zpak, tessalon, check CXR to rule out pneumonia.

## 2017-10-24 NOTE — Patient Instructions (Signed)
Please begin zpak (antibiotic) and tessalon (cough suppressant). Complete chest x ray on the first floor. Call if new/worsening symptoms or if you are not improved in 3 days.

## 2017-11-15 ENCOUNTER — Other Ambulatory Visit: Payer: Self-pay | Admitting: Family

## 2017-11-15 MED ORDER — LEVOTHYROXINE SODIUM 50 MCG PO TABS: 50.0000 ug | ORAL_TABLET | Freq: Every day | ORAL | 1 refills | Status: DC

## 2018-01-08 ENCOUNTER — Encounter: Payer: Medicare Other | Admitting: Family

## 2018-01-24 NOTE — Telephone Encounter (Signed)
error 

## 2018-02-14 ENCOUNTER — Encounter: Payer: Self-pay | Admitting: Family

## 2018-02-14 ENCOUNTER — Ambulatory Visit: Payer: Medicare Other | Admitting: Family

## 2018-02-14 VITALS — BP 142/72 | HR 78 | Temp 98.3°F | Resp 16 | Ht 63.0 in | Wt 181.4 lb

## 2018-02-14 DIAGNOSIS — I10 Essential (primary) hypertension: Secondary | ICD-10-CM

## 2018-02-14 DIAGNOSIS — K219 Gastro-esophageal reflux disease without esophagitis: Secondary | ICD-10-CM | POA: Diagnosis not present

## 2018-02-14 DIAGNOSIS — G4700 Insomnia, unspecified: Secondary | ICD-10-CM | POA: Diagnosis not present

## 2018-02-14 DIAGNOSIS — E039 Hypothyroidism, unspecified: Secondary | ICD-10-CM | POA: Diagnosis not present

## 2018-02-14 LAB — BASIC METABOLIC PANEL
BUN: 23 mg/dL (ref 6–23)
CHLORIDE: 105 meq/L (ref 96–112)
CO2: 32 mEq/L (ref 19–32)
Calcium: 10 mg/dL (ref 8.4–10.5)
Creatinine, Ser: 0.85 mg/dL (ref 0.40–1.20)
GFR: 69.61 mL/min (ref >60.00)
GLUCOSE: 99 mg/dL (ref 70–99)
Potassium: 5.5 mEq/L — ABNORMAL HIGH (ref 3.5–5.1)
SODIUM: 144 meq/L (ref 135–145)

## 2018-02-14 LAB — TSH: TSH: 1.24 u[IU]/mL (ref 0.35–4.50)

## 2018-02-14 NOTE — Patient Instructions (Signed)
Please complete lab work prior to leaving.   

## 2018-02-14 NOTE — Progress Notes (Signed)
Subjective:    Patient ID: Rebekah Valentine, female    DOB: [birth year], 72 y.o.   MRN: [id number]  HPI  [name] is a 72 yr old female who presents today for follow up.  HTN- patient is maintained on toprol xl 25mg .  BP Readings from Last 3 Encounters:  02/14/18 (!) 142/72  10/24/17 (!) 143/69  09/05/17 (!) 146/59   GERD- continues omeprazole 20mg .  Reports symptoms are stable.    Hypothyroid- continues synthroid 50 mcg.  Insomnia- continues elavil. Reports that she is sleeping well.   Review of Systems    see HPI  Past Medical History:  Diagnosis Date  . Arthritis   . Breast cyst    recurrent  . COLONIC POLYPS, HX OF 11/2007  . GERD (gastroesophageal reflux disease)   . HEMORRHOIDS   . HYPERTENSION   . Hypothyroidism   . INSOMNIA   . Knee pain    Arthritis   . SKIN CANCER, HX OF   . TROCHANTERIC BURSITIS, RIGHT      Social History   Socioeconomic History  . Marital status: Widowed    Spouse name: Not on file  . Number of children: Not on file  . Years of education: Not on file  . Highest education level: Not on file  Occupational History  . Not on file  Social Needs  . Financial resource strain: Not on file  . Food insecurity:    Worry: Not on file    Inability: Not on file  . Transportation needs:    Medical: Not on file    Non-medical: Not on file  Tobacco Use  . Smoking status: Never Smoker  . Smokeless tobacco: Never Used  . Tobacco comment: Married, lives with spouse. Retired asst. teacher-subs now [employer]  Substance and Sexual Activity  . Alcohol use: Yes    Alcohol/week: 0.6 - 1.2 oz    Types: 1 - 2 Glasses of wine per week    Comment: occassional glass of wine  . Drug use: No  . Sexual activity: Never  Lifestyle  . Physical activity:    Days per week: Not on file    Minutes per session: Not on file  . Stress: Not on file  Relationships  . Social connections:    Talks on phone: Not on file    Gets together: Not on file    Attends  religious service: Not on file    Active member of club or organization: Not on file    Attends meetings of clubs or organizations: Not on file    Relationship status: Not on file  . Intimate partner violence:    Fear of current or ex partner: Not on file    Emotionally abused: Not on file    Physically abused: Not on file    Forced sexual activity: Not on file  Other Topics Concern  . 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 BASAL CELL CARCINOMA EXCISION     face  . colon polpectomy  11/2007   Dr. Fuller Plan  . HYSTEROSCOPY     polyp  . Lumpectomy Right 1990   breast  . TONSILLECTOMY  1957  . TONSILLECTOMY AND ADENOIDECTOMY    . TOTAL KNEE ARTHROPLASTY Right  02/22/2016   Procedure: RIGHT TOTAL KNEE ARTHROPLASTY;  Surgeon: Vickey Huger, MD;  Location: [facility];  Service: Orthopedics;  Laterality: Right;    Family History  Problem Relation Age of Onset  . Arthritis Father   . Coronary artery disease Father   . Hypertension Father   . Heart disease Father   . Breast cancer Paternal Aunt   . Diabetes Paternal Grandmother   . COPD Sister        smoker  . Blindness Sister        since birth  . Congestive Heart Failure Sister   . COPD Mother        smoker  . Colon cancer Neg Hx     No Known Allergies  Current Outpatient Medications on File Prior to Visit  Medication Sig Dispense Refill  . amitriptyline (ELAVIL) 25 MG tablet TAKE 1 TABLET BY MOUTH EVERY NIGHT AT BEDTIME 90 tablet 1  . aspirin EC 81 MG tablet Take 81 mg by mouth 3 (three) times a week.     . Calcium Carbonate-Vit D-Min (CALCIUM 1200 PO) Take 1 tablet by mouth every morning. 600mg  calcium and 500mg  vitamin D each tablet    . Cholecalciferol (VITAMIN D3) 2000 UNITS TABS Take 2,000 Units by mouth daily.     .  cycloSPORINE (RESTASIS) 0.05 % ophthalmic emulsion Place 1 drop into both eyes every 12 (twelve) hours.     Marland Kitchen levothyroxine (SYNTHROID, LEVOTHROID) 50 MCG tablet Take 1 tablet (50 mcg total) by mouth daily before breakfast. 90 tablet 1  . metoprolol succinate (TOPROL-XL) 25 MG 24 hr tablet TAKE 1 TABLET BY MOUTH EVERY DAY 90 tablet 1  . Multiple Vitamins-Minerals (ONE-A-DAY EXTRAS ANTIOXIDANT) CAPS Take 1 capsule by mouth daily.     Marland Kitchen omeprazole (PRILOSEC) 20 MG capsule TAKE 1 CAPSULE BY MOUTH EVERY DAY 90 capsule 1  . azithromycin (ZITHROMAX Z-PAK) 250 MG tablet 2 tabs by mouth today, then 1 tab once daily for 4 more days. (Patient not taking: Reported on 02/14/2018) 6 tablet 0  . benzonatate (TESSALON) 100 MG capsule Take 1 capsule (100 mg total) by mouth 3 (three) times daily as needed. (Patient not taking: Reported on 02/14/2018) 20 capsule 0  . Biotin (BIOTIN 5000) 5 MG CAPS Take by mouth.     No current facility-administered medications on file prior to visit.     BP (!) 142/72 (BP Location: Right Arm, Patient Position: Sitting, Cuff Size: Small)   Pulse 78   Temp 98.3 F (36.8 C) (Oral)   Resp 16   Ht 5\' 3"  (1.6 m)   Wt 181 lb 6.4 oz (82.3 kg)   SpO2 99%   BMI 32.13 kg/m    Objective:   Physical Exam  Constitutional: She is oriented to person, place, and time. She appears well-developed and well-nourished.  Cardiovascular: Normal rate, regular rhythm and normal heart sounds.  No murmur heard. Pulmonary/Chest: Effort normal and breath sounds normal. No respiratory distress. She has no wheezes.  Neurological: She is alert and oriented to person, place, and time. She displays normal reflexes. No cranial nerve deficit. Coordination normal.  Skin: Skin is warm.  Psychiatric: She has a normal mood and affect. Her behavior is normal. Judgment and thought content normal.          Assessment & Plan:  HTN- BP acceptable for her age. Continue current medication, obtain follow up  bmet.  Insomnia-stable on elavil. Continue same.  GERD- stable on PPI, continue same.  Hypothyroid- feeling stable on current dose of synthroid, continue same, obtain follow up tsh.

## 2018-02-15 ENCOUNTER — Telehealth: Payer: Self-pay | Admitting: Family

## 2018-02-15 ENCOUNTER — Other Ambulatory Visit: Payer: Self-pay

## 2018-02-15 DIAGNOSIS — E875 Hyperkalemia: Secondary | ICD-10-CM

## 2018-02-15 NOTE — Telephone Encounter (Signed)
Results given to patient, she was scheduled to be here tomorrow for labs. Order for labs entered as future.

## 2018-02-15 NOTE — Telephone Encounter (Signed)
Potassium is elevated.  I would like her to repeat bmet today or tomorrow please. Dx hyperkalemia.  Also, thyroid testing looks good. Continue current dose of synthroid.

## 2018-02-16 ENCOUNTER — Other Ambulatory Visit (INDEPENDENT_AMBULATORY_CARE_PROVIDER_SITE_OTHER): Payer: Medicare Other

## 2018-02-16 ENCOUNTER — Encounter: Payer: Self-pay | Admitting: Family

## 2018-02-16 ENCOUNTER — Telehealth: Payer: Self-pay | Admitting: Family

## 2018-02-16 DIAGNOSIS — E875 Hyperkalemia: Secondary | ICD-10-CM | POA: Diagnosis not present

## 2018-02-16 LAB — BASIC METABOLIC PANEL
BUN: 19 mg/dL (ref 6–23)
CALCIUM: 9.5 mg/dL (ref 8.4–10.5)
CO2: 32 meq/L (ref 19–32)
Chloride: 108 mEq/L (ref 96–112)
Creatinine, Ser: 0.88 mg/dL (ref 0.40–1.20)
GFR: 66.88 mL/min (ref >60.00)
Glucose, Bld: 94 mg/dL (ref 70–99)
POTASSIUM: 5.2 meq/L — AB (ref 3.5–5.1)
SODIUM: 148 meq/L — AB (ref 135–145)

## 2018-02-16 MED ORDER — AMLODIPINE BESYLATE 5 MG PO TABS: 5.0000 mg | ORAL_TABLET | Freq: Every day | ORAL | 3 refills | Status: DC

## 2018-02-16 NOTE — Telephone Encounter (Signed)
Potassium is still mildly elevated. Could be side effect of toprol.  D/c toprol, start amlodipine. Follow up with me in 1 week.

## 2018-02-19 ENCOUNTER — Telehealth: Payer: Self-pay | Admitting: Family

## 2018-02-19 NOTE — Telephone Encounter (Signed)
Talked to patient earlier today and explained why she needs to switch and to only take the amlodipine 5 mg. She was also scheduled to follow up with Melissa on 02-27-18 for bp ck and bmp h/o hyperkalemia.

## 2018-02-19 NOTE — Telephone Encounter (Signed)
Results given to patient, advised to switch bp medication. She was scheduled to follow up on 02-27-18 for one week follow up and bp, bmp check.

## 2018-02-19 NOTE — Telephone Encounter (Signed)
Copied from [place] [phone]. Topic: Quick Communication - See Telephone Encounter >> Feb 19, 2018  9:57 AM Synthia Innocent wrote: CRM for notification. See Telephone encounter for: 02/19/18. Requesting to speak with nurse regarding BP meds, confused, thought she was to take metoprolol succinate (TOPROL-XL) 24 hr tablet 25 mg  but now she just picked up amLODipine (NORVASC) 5 MG tablet. Is she suppose to take both?

## 2018-02-19 NOTE — Telephone Encounter (Signed)
Received call from pharmacy wanting to verify medication change. Advised pharmacist of below. She states they were unaware that metoprolol had been stopped and both medications were released to pt on Friday. Spoke with pt. Pt upset and states, "why didn't someone call me on Friday, now I'm stuck with this medication and I am on a fixed income". I apologized to pt for her inconvenience and again explained why we are changing medication. Advised pt that she was not contacted over the weekend because her results were not life threatening and results were sent to [place] after 5pm on Friday and we were only able to contact her about them today. I advised pt to set metoprolol aside until her follow up labs until we know if medication change will be permanent. Pt voices understanding but still upset that medication / money may be wasted.

## 2018-02-19 NOTE — Telephone Encounter (Signed)
Noted  

## 2018-02-27 ENCOUNTER — Encounter: Payer: Self-pay | Admitting: Family

## 2018-02-27 ENCOUNTER — Ambulatory Visit: Payer: Medicare Other | Admitting: Family

## 2018-02-27 VITALS — BP 139/66 | HR 77 | Temp 98.1°F | Resp 16 | Ht 63.0 in | Wt 184.0 lb

## 2018-02-27 DIAGNOSIS — E875 Hyperkalemia: Secondary | ICD-10-CM

## 2018-02-27 DIAGNOSIS — I10 Essential (primary) hypertension: Secondary | ICD-10-CM | POA: Diagnosis not present

## 2018-02-27 LAB — BASIC METABOLIC PANEL
BUN: 22 mg/dL (ref 6–23)
CALCIUM: 9.4 mg/dL (ref 8.4–10.5)
CO2: 33 mEq/L — ABNORMAL HIGH (ref 19–32)
Chloride: 105 mEq/L (ref 96–112)
Creatinine, Ser: 0.8 mg/dL (ref 0.40–1.20)
GFR: 74.65 mL/min (ref >60.00)
GLUCOSE: 99 mg/dL (ref 70–99)
POTASSIUM: 5.1 meq/L (ref 3.5–5.1)
Sodium: 143 mEq/L (ref 135–145)

## 2018-02-27 NOTE — Patient Instructions (Addendum)
Please complete lab work prior to leaving.   

## 2018-02-27 NOTE — Progress Notes (Signed)
Subjective:    Patient ID: Rebekah Valentine, female    DOB: [birth year], 72 y.o.   MRN: [id number]  HPI   Rebekah Valentine is a 72 yr old female who presents today for follow up.  HTN- last visit toprol was d/c'd and pt was started on amlodipine.  BP Readings from Last 3 Encounters:  02/27/18 139/66  02/14/18 (!) 142/72  10/24/17 (!) 143/69   Hyperkalemia- K+ was 5.5 on 02/14/18.  Repeat K+ was 5.2 on 5/31. We stopped her toprol as hyperkalemia can be a side effect of toprol.  She denies CP/SOB or palpitations.    Review of Systems    see HPI  Past Medical History:  Diagnosis Date  . Arthritis   . Breast cyst    recurrent  . COLONIC POLYPS, HX OF 11/2007  . GERD (gastroesophageal reflux disease)   . HEMORRHOIDS   . HYPERTENSION   . Hypothyroidism   . INSOMNIA   . Knee pain    Arthritis   . SKIN CANCER, HX OF   . TROCHANTERIC BURSITIS, RIGHT      Social History   Socioeconomic History  . Marital status: Widowed    Spouse name: Not on file  . Number of children: Not on file  . Years of education: Not on file  . Highest education level: Not on file  Occupational History  . Not on file  Social Needs  . Financial resource strain: Not on file  . Food insecurity:    Worry: Not on file    Inability: Not on file  . Transportation needs:    Medical: Not on file    Non-medical: Not on file  Tobacco Use  . Smoking status: Never Smoker  . Smokeless tobacco: Never Used  . Tobacco comment: Married, lives with spouse. Retired asst. teacher-subs now K-5  Substance and Sexual Activity  . Alcohol use: Yes    Alcohol/week: 0.6 - 1.2 oz    Types: 1 - 2 Glasses of wine per week    Comment: occassional glass of wine  . Drug use: No  . Sexual activity: Never  Lifestyle  . Physical activity:    Days per week: Not on file    Minutes per session: Not on file  . Stress: Not on file  Relationships  . Social connections:    Talks on phone: Not on file    Gets together: Not on  file    Attends religious service: Not on file    Active member of club or organization: Not on file    Attends meetings of clubs or organizations: Not on file    Relationship status: Not on file  . Intimate partner violence:    Fear of current or ex partner: Not on file    Emotionally abused: Not on file    Physically abused: Not on file    Forced sexual activity: Not on file  Other Topics Concern  . 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 BASAL CELL CARCINOMA EXCISION     face  . colon polpectomy  11/2007   Dr. Fuller Plan  . HYSTEROSCOPY     polyp  . Lumpectomy Right 1990   breast  . TONSILLECTOMY  1957  . TONSILLECTOMY  AND ADENOIDECTOMY    . TOTAL KNEE ARTHROPLASTY Right 02/22/2016   Procedure: RIGHT TOTAL KNEE ARTHROPLASTY;  Surgeon: Vickey Huger, MD;  Location: [facility];  Service: Orthopedics;  Laterality: Right;    Family History  Problem Relation Age of Onset  . Arthritis Father   . Coronary artery disease Father   . Hypertension Father   . Heart disease Father   . COPD Sister        smoker  . Blindness Sister        since birth  . Congestive Heart Failure Sister   . COPD Mother        smoker  . Breast cancer Paternal Aunt   . Diabetes Paternal Grandmother   . Colon cancer Neg Hx     No Known Allergies  Current Outpatient Medications on File Prior to Visit  Medication Sig Dispense Refill  . amitriptyline (ELAVIL) 25 MG tablet TAKE 1 TABLET BY MOUTH EVERY NIGHT AT BEDTIME 90 tablet 1  . amLODipine (NORVASC) 5 MG tablet Take 1 tablet (5 mg total) by mouth daily. 30 tablet 3  . aspirin EC 81 MG tablet Take 81 mg by mouth 3 (three) times a week.     . Biotin (BIOTIN 5000) 5 MG CAPS Take by mouth.    . Calcium Carbonate-Vit D-Min (CALCIUM 1200 PO) Take 1 tablet by mouth  every morning. 600mg  calcium and 500mg  vitamin D each tablet    . Cholecalciferol (VITAMIN D3) 2000 UNITS TABS Take 2,000 Units by mouth daily.     . cycloSPORINE (RESTASIS) 0.05 % ophthalmic emulsion Place 1 drop into both eyes every 12 (twelve) hours.     Marland Kitchen levothyroxine (SYNTHROID, LEVOTHROID) 50 MCG tablet Take 1 tablet (50 mcg total) by mouth daily before breakfast. 90 tablet 1  . Multiple Vitamins-Minerals (ONE-A-DAY EXTRAS ANTIOXIDANT) CAPS Take 1 capsule by mouth daily.     Marland Kitchen omeprazole (PRILOSEC) 20 MG capsule TAKE 1 CAPSULE BY MOUTH EVERY DAY 90 capsule 1   No current facility-administered medications on file prior to visit.     BP 139/66 (BP Location: Left Arm, Patient Position: Sitting, Cuff Size: Large)   Pulse 77   Temp 98.1 F (36.7 C) (Oral)   Resp 16   Ht 5\' 3"  (1.6 m)   Wt 184 lb (83.5 kg)   SpO2 100%   BMI 32.59 kg/m    Objective:   Physical Exam  Constitutional: She is oriented to person, place, and time. She appears well-developed and well-nourished.  Cardiovascular: Normal rate, regular rhythm and normal heart sounds.  No murmur heard. Pulmonary/Chest: Effort normal and breath sounds normal. No respiratory distress. She has no wheezes.  Neurological: She is alert and oriented to person, place, and time.  Psychiatric: She has a normal mood and affect. Her behavior is normal. Judgment and thought content normal.          Assessment & Plan:  HTN- bp is stable/improved on amlodipine, continue same.  Hyperkalemia-  Obtain follow up bmet.

## 2018-04-18 IMAGING — DX DG CHEST 2V
2 series · 2 of 2 positions shown · non-contrast
Comparison: None.

CLINICAL DATA: Preop clearance.

EXAM:
CHEST  2 VIEW

[chest pa]
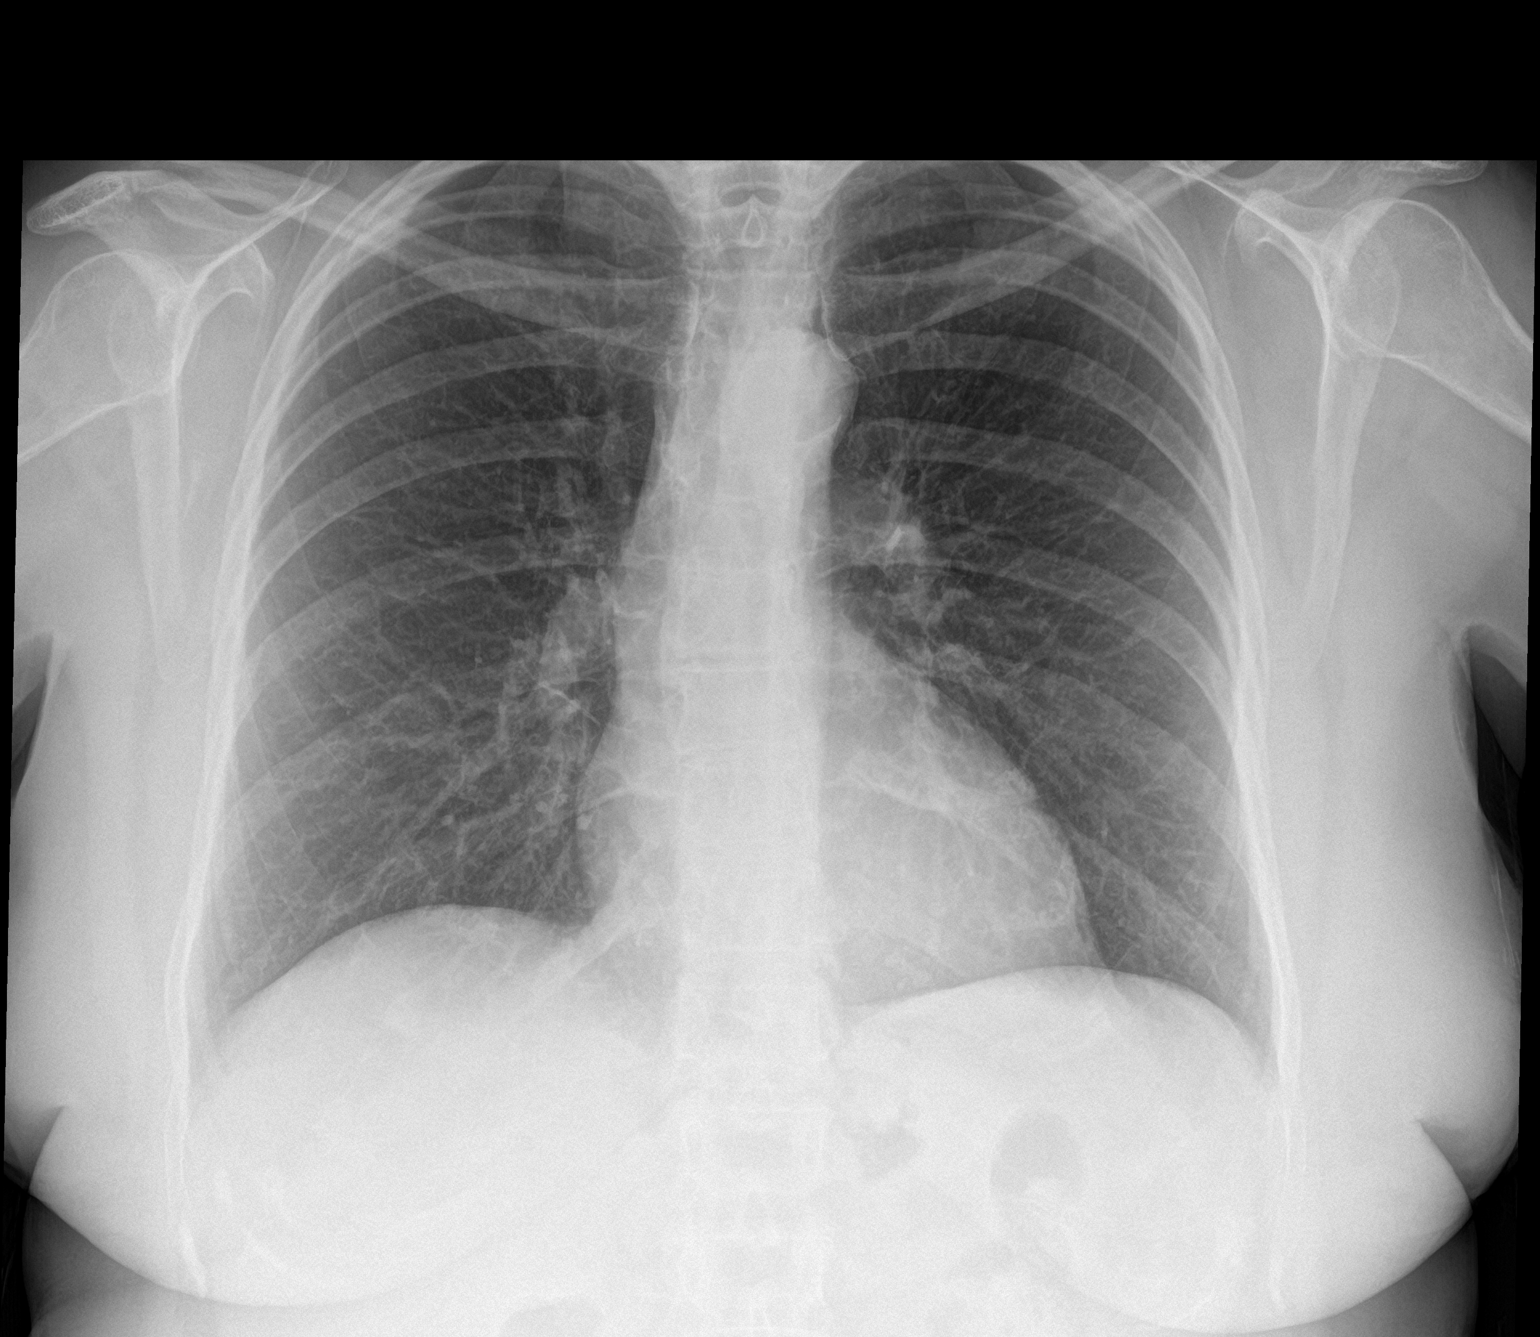

[chest lat]
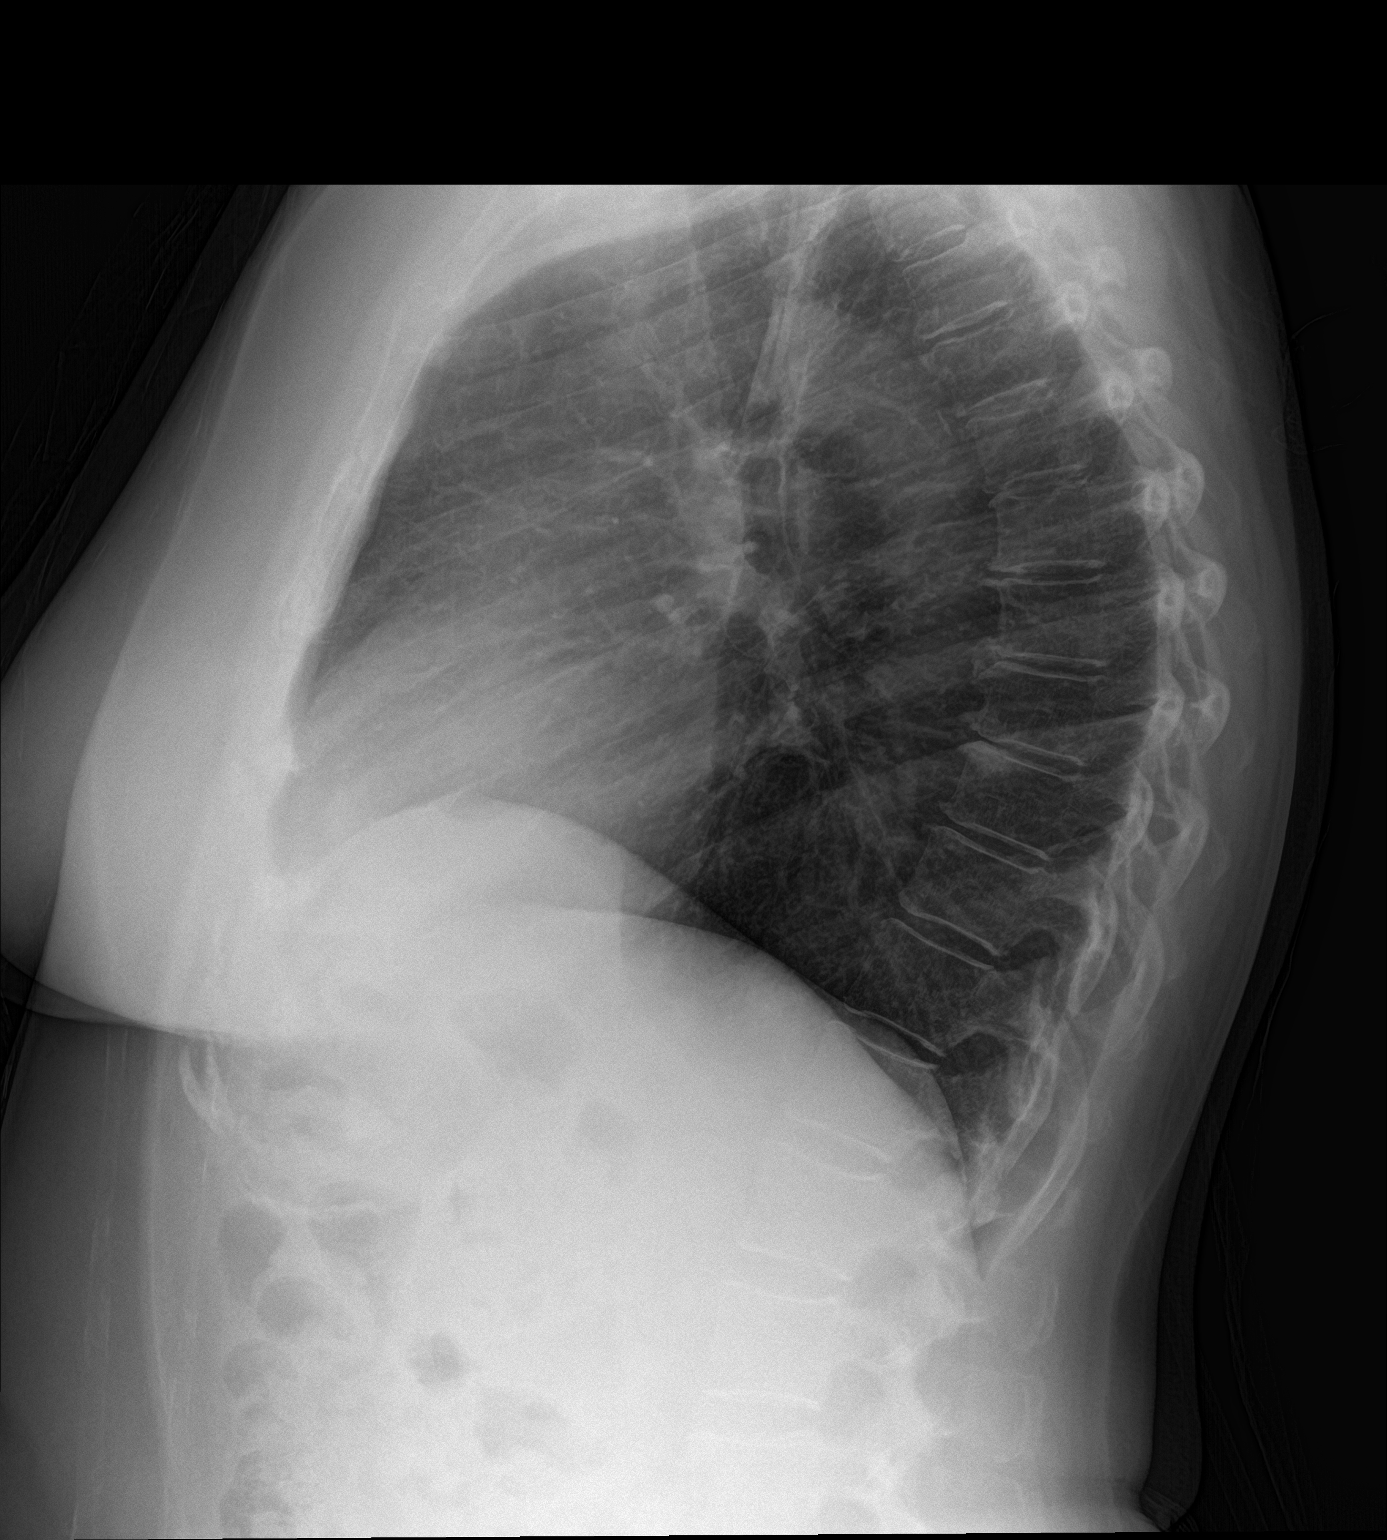

[2 of 2 positions shown; findings below may reference images not displayed]

FINDINGS: The heart size and mediastinal contours are within normal limits.
Both lungs are clear. The visualized skeletal structures are
unremarkable.
IMPRESSION: No active cardiopulmonary disease.

## 2018-05-14 ENCOUNTER — Other Ambulatory Visit: Payer: Self-pay | Admitting: Family

## 2018-08-10 NOTE — Progress Notes (Signed)
Subjective:   Rebekah Valentine is a 72 y.o. female who presents for Medicare Annual (Subsequent) preventive examination.  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  Review of Systems: No ROS.  Medicare Wellness Visit. Additional risk factors are reflected in the social history. Cardiac Risk Factors include: advanced age (>25men, >80 women);hypertension Sleep patterns: Takes Elavil. Sleeps 7-8 hrs. Feels rested. Home Safety/Smoke Alarms: Feels safe in home. Smoke alarms in place. Lives in 1 story home. Son lives with her.   Female:        Mammo- declines    Dexa scan- declines [facility]- next due 12/2019 Dr.Shapiro yearly for eye exams.     Objective:     Vitals: BP 130/68 (BP Location: Left Arm, Patient Position: Sitting, Cuff Size: Normal)   Pulse 72   Ht 5\' 3"  (1.6 m)   Wt 185 lb 3.2 oz (84 kg)   SpO2 97%   BMI 32.81 kg/m   Body mass index is 32.81 kg/m.  Advanced Directives 08/13/2018 02/11/2016 01/15/2016 12/15/2015 01/12/2015 12/22/2014  Does Patient Have a Medical Advance Directive? Yes Yes Yes Yes Yes Yes  Type of Paramedic of [place];Living will [place];Living will Living will [place];Living will Living will [place];Living will  Does patient want to make changes to medical advance directive? - - No - Patient declined No - Patient declined - -  Copy of [place] in Chart? Yes - validated most recent copy scanned in chart (See row information) (No Data) Yes No - copy requested No - copy requested -    Tobacco Social History   Tobacco Use  Smoking Status Never Smoker  Smokeless Tobacco Never Used  Tobacco Comment   Married, lives with spouse. Retired asst. teacher-subs now [employer]     Counseling given: Not Answered Comment: Married, lives with spouse. Retired asst. teacher-subs now K-5   Clinical Intake: Pain : No/denies pain    Past Medical History:  Diagnosis Date    . Arthritis   . Breast cyst    recurrent  . COLONIC POLYPS, HX OF 11/2007  . GERD (gastroesophageal reflux disease)   . HEMORRHOIDS   . HYPERTENSION   . Hypothyroidism   . INSOMNIA   . Knee pain    Arthritis   . SKIN CANCER, HX OF   . TROCHANTERIC BURSITIS, RIGHT    Past Surgical History:  Procedure Laterality Date  . BASAL CELL CARCINOMA EXCISION     face  . colon polpectomy  11/2007   Dr. Fuller Plan  . HYSTEROSCOPY     polyp  . Lumpectomy Right 1990   breast  . TONSILLECTOMY  1957  . TONSILLECTOMY AND ADENOIDECTOMY    . TOTAL KNEE ARTHROPLASTY Right 02/22/2016   Procedure: RIGHT TOTAL KNEE ARTHROPLASTY;  Surgeon: Vickey Huger, MD;  Location: [facility];  Service: Orthopedics;  Laterality: Right;   Family History  Problem Relation Age of Onset  . Arthritis Father   . Coronary artery disease Father   . Hypertension Father   . Heart disease Father   . COPD Sister        smoker  . Blindness Sister        since birth  . Congestive Heart Failure Sister   . COPD Mother        smoker  . Breast cancer Paternal Aunt   . Diabetes Paternal Grandmother   . Colon cancer Neg Hx    Social History  Socioeconomic History  . Marital status: Widowed    Spouse name: Not on file  . Number of children: Not on file  . Years of education: Not on file  . Highest education level: Not on file  Occupational History  . Not on file  Social Needs  . Financial resource strain: Not on file  . Food insecurity:    Worry: Not on file    Inability: Not on file  . Transportation needs:    Medical: Not on file    Non-medical: Not on file  Tobacco Use  . Smoking status: Never Smoker  . Smokeless tobacco: Never Used  . Tobacco comment: Married, lives with spouse. Retired asst. teacher-subs now K-5  Substance and Sexual Activity  . Alcohol use: Yes    Alcohol/week: 1.0 - 2.0 standard drinks    Types: 1 - 2 Glasses of wine per week    Comment: occassional glass of wine  . Drug use: No  . Sexual  activity: Never  Lifestyle  . Physical activity:    Days per week: Not on file    Minutes per session: Not on file  . Stress: Not on file  Relationships  . Social connections:    Talks on phone: Not on file    Gets together: Not on file    Attends religious service: Not on file    Active member of club or organization: Not on file    Attends meetings of clubs or organizations: Not on file    Relationship status: Not on file  Other Topics Concern  . 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 amitriptyline (ELAVIL) 25 MG tablet TAKE 1 TABLET BY MOUTH EVERY NIGHT AT BEDTIME  . amLODipine (NORVASC) 5 MG tablet TAKE 1 TABLET(5 MG) BY MOUTH DAILY  . aspirin EC 81 MG tablet Take 81 mg by mouth 3 (three) times a week.   . Biotin (BIOTIN 5000) 5 MG CAPS Take by mouth.  . Calcium Carbonate-Vit D-Min (CALCIUM 1200 PO) Take 1 tablet by mouth every morning. 600mg  calcium and 500mg  vitamin D each tablet  . Cholecalciferol (VITAMIN D3) 2000 UNITS TABS Take 2,000 Units by mouth daily.   . cycloSPORINE (RESTASIS) 0.05 % ophthalmic emulsion Place 1 drop into both eyes every 12 (twelve) hours.   Marland Kitchen levothyroxine (SYNTHROID, LEVOTHROID) 50 MCG tablet TAKE 1 TABLET(50 MCG) BY MOUTH DAILY BEFORE BREAKFAST  . Multiple Vitamins-Minerals (ONE-A-DAY EXTRAS ANTIOXIDANT) CAPS Take 1 capsule by mouth daily.   Marland Kitchen omeprazole (PRILOSEC) 20 MG capsule TAKE 1 CAPSULE BY MOUTH EVERY DAY   No facility-administered encounter medications on file as of 08/13/2018.     Activities of Daily Living In your present state of health, do you have any difficulty performing the following activities: 08/13/2018  Hearing? N  Vision? N  Difficulty concentrating or making decisions? N  Walking or climbing stairs? N    Dressing or bathing? N  Doing errands, shopping? N  Preparing Food and eating ? N  Using the Toilet? N  In the past six months, have you accidently leaked urine? N  Do you have problems with loss of bowel control? N  Managing your Medications? N  Managing your Finances? N  Housekeeping or managing your Housekeeping?  N  Some recent data might be hidden    Patient Care Team: [name], NP as PCP - General (Internal Medicine) Ladene Artist, MD as Consulting Physician (Gastroenterology) Danella Sensing, MD as Consulting Physician (Dermatology)    Assessment:   This is a routine wellness examination for Ritta. Physical assessment deferred to PCP.  Exercise Activities and Dietary recommendations Current Exercise Habits: Home exercise routine, Type of exercise: walking, Time (Minutes): 30, Frequency (Times/Week): 7, Weekly Exercise (Minutes/Week): 210, Intensity: Mild, Exercise limited by: None identified Diet (meal preparation, eat out, water intake, caffeinated beverages, dairy products, fruits and vegetables): well balanced, on average, 3 meals per day     Goals    . Lose weight (pt-stated)     Lose 20 lbs.   Portion control and exercise.         Fall Risk Fall Risk  08/13/2018 09/05/2017 07/27/2016 12/15/2015 11/24/2014  Falls in the past year? 0 No No Yes Yes  Comment - - - Fell on uneven concrete. No major injuries.  -  Number falls in past yr: - - - 1 1  Injury with Fall? - - - No No  Risk for fall due to : - - - - Other (Comment)  Follow up - - - Education provided;Falls prevention discussed Education provided    Depression Screen PHQ 2/9 Scores 08/13/2018 09/05/2017 07/27/2016 12/15/2015  PHQ - 2 Score 0 1 0 0     Cognitive Function Ad8 score reviewed for issues:  Issues making decisions:no  Less interest in hobbies / activities:no  Repeats questions, stories (family complaining):no  Trouble using ordinary gadgets (microwave, computer,  phone):no  Forgets the month or year: no  Mismanaging finances: no  Remembering appts:no  Daily problems with thinking and/or memory:no Ad8 score is=0   MMSE - Mini Mental State Exam 12/15/2015  Orientation to time 5  Orientation to Place 5  Registration 3  Attention/ Calculation 5  Recall 3  Language- name 2 objects 2  Language- repeat 1  Language- follow 3 step command 3  Language- read & follow direction 1  Write a sentence 1  Copy design 1  Total score 30        Immunization History  Administered Date(s) Administered  . Influenza Split 07/10/2012, 06/19/2014  . Influenza Whole 10/19/2009, 07/06/2010  . Influenza, High Dose Seasonal PF 06/19/2013, 07/27/2016, 05/30/2017  . Influenza-Unspecified 06/19/2014, 06/02/2015  . Pneumococcal Conjugate-13 10/13/2014  . Pneumococcal Polysaccharide-23 12/30/2008, 09/05/2017  . Td 03/02/2002  . Tdap 03/21/2011  . Zoster 10/25/2007  . Zoster Recombinat (Shingrix) 11/20/2017, 01/23/2018   Screening Tests Health Maintenance  Topic Date Due  . MAMMOGRAM  01/25/2018  . INFLUENZA VACCINE  04/19/2018  . COLONOSCOPY  01/12/2020  . TETANUS/TDAP  03/20/2021  . DEXA SCAN  Completed  . Hepatitis C Screening  Completed  . PNA vac Low Risk Adult  Completed    Plan:  Please schedule your next medicare wellness visit with me in 1 yr.  Continue to eat heart healthy diet (full of fruits, vegetables, whole grains, lean protein, water--limit salt, fat, and sugar intake) and increase physical activity as tolerated.   I have personally reviewed and noted the following in the patient's chart:   . Medical and social history . Use of alcohol, tobacco or illicit drugs  . Current medications and supplements . Functional ability and status . Nutritional status . Physical activity . Advanced directives . List of other physicians . Hospitalizations, surgeries, and ER visits  in previous 12 months . Vitals . Screenings to include  cognitive, depression, and falls . Referrals and appointments  In addition, I have reviewed and discussed with patient certain preventive protocols, quality metrics, and best practice recommendations. A written personalized care plan for preventive services as well as general preventive health recommendations were provided to patient.     Shela Nevin, [place]  08/13/2018

## 2018-08-13 ENCOUNTER — Encounter: Payer: Self-pay | Admitting: Family

## 2018-08-13 ENCOUNTER — Ambulatory Visit: Payer: Medicare Other | Admitting: Family

## 2018-08-13 ENCOUNTER — Encounter: Payer: Self-pay | Admitting: *Deleted

## 2018-08-13 ENCOUNTER — Ambulatory Visit: Payer: Medicare Other | Admitting: *Deleted

## 2018-08-13 VITALS — BP 130/68 | HR 72 | Temp 97.9°F | Ht 63.0 in | Wt 185.0 lb

## 2018-08-13 VITALS — BP 130/68 | HR 72 | Ht 63.0 in | Wt 185.2 lb

## 2018-08-13 DIAGNOSIS — Z0000 Encounter for general adult medical examination without abnormal findings: Secondary | ICD-10-CM

## 2018-08-13 DIAGNOSIS — K219 Gastro-esophageal reflux disease without esophagitis: Secondary | ICD-10-CM | POA: Diagnosis not present

## 2018-08-13 DIAGNOSIS — I10 Essential (primary) hypertension: Secondary | ICD-10-CM

## 2018-08-13 DIAGNOSIS — E785 Hyperlipidemia, unspecified: Secondary | ICD-10-CM

## 2018-08-13 DIAGNOSIS — Z66 Do not resuscitate: Secondary | ICD-10-CM

## 2018-08-13 LAB — BASIC METABOLIC PANEL
BUN: 20 mg/dL (ref 6–23)
CALCIUM: 9.3 mg/dL (ref 8.4–10.5)
CHLORIDE: 102 meq/L (ref 96–112)
CO2: 32 meq/L (ref 19–32)
Creatinine, Ser: 0.88 mg/dL (ref 0.40–1.20)
GFR: 66.79 mL/min (ref >60.00)
Glucose, Bld: 89 mg/dL (ref 70–99)
Potassium: 4.3 mEq/L (ref 3.5–5.1)
Sodium: 141 mEq/L (ref 135–145)

## 2018-08-13 LAB — LIPID PANEL
CHOL/HDL RATIO: 3
Cholesterol: 176 mg/dL (ref 0–200)
HDL: 54.8 mg/dL (ref >39.00)
LDL CALC: 102 mg/dL — AB (ref 0–99)
NONHDL: 121.58
Triglycerides: 99 mg/dL (ref 0.0–149.0)
VLDL: 19.8 mg/dL (ref 0.0–40.0)

## 2018-08-13 NOTE — Patient Instructions (Signed)
Please complete lab work prior to leaving.   

## 2018-08-13 NOTE — Progress Notes (Addendum)
Subjective:    Patient ID: Rebekah Valentine, female    DOB: 04/16/1946, 72 y.o.   MRN: [id number]  HPI   Patient is a 72 yr old female who presents today for follow up.   gerd-  Reports gerd symptoms are well controlled on omeprazole.   Hypothyroid- maintained on synthroid.  Lab Results  Component Value Date   TSH 1.24 02/14/2018   HTN- continues amlodipine. Denies swelling, chest pain or sob.   BP Readings from Last 3 Encounters:  08/13/18 130/68  08/13/18 130/68  02/27/18 139/66     Review of Systems    see HPI  Past Medical History:  Diagnosis Date  . Arthritis   . Breast cyst    recurrent  . COLONIC POLYPS, HX OF 11/2007  . GERD (gastroesophageal reflux disease)   . HEMORRHOIDS   . HYPERTENSION   . Hypothyroidism   . INSOMNIA   . Knee pain    Arthritis   . SKIN CANCER, HX OF   . TROCHANTERIC BURSITIS, RIGHT      Social History   Socioeconomic History  . Marital status: Widowed    Spouse name: Not on file  . Number of children: Not on file  . Years of education: Not on file  . Highest education level: Not on file  Occupational History  . Not on file  Social Needs  . Financial resource strain: Not on file  . Food insecurity:    Worry: Not on file    Inability: Not on file  . Transportation needs:    Medical: Not on file    Non-medical: Not on file  Tobacco Use  . Smoking status: Never Smoker  . Smokeless tobacco: Never Used  . Tobacco comment: Married, lives with spouse. Retired asst. teacher-subs now K-5  Substance and Sexual Activity  . Alcohol use: Yes    Alcohol/week: 1.0 - 2.0 standard drinks    Types: 1 - 2 Glasses of wine per week    Comment: occassional glass of wine  . Drug use: No  . Sexual activity: Never  Lifestyle  . Physical activity:    Days per week: Not on file    Minutes per session: Not on file  . Stress: Not on file  Relationships  . Social connections:    Talks on phone: Not on file    Gets together: Not on file      Attends religious service: Not on file    Active member of club or organization: Not on file    Attends meetings of clubs or organizations: Not on file    Relationship status: Not on file  . Intimate partner violence:    Fear of current or ex partner: Not on file    Emotionally abused: Not on file    Physically abused: Not on file    Forced sexual activity: Not on file  Other Topics Concern  . 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 BASAL CELL CARCINOMA EXCISION     face  . colon polpectomy  11/2007   Dr. Fuller Plan  . HYSTEROSCOPY     polyp  . Lumpectomy Right 1990   breast  . TONSILLECTOMY  1957  . TONSILLECTOMY AND ADENOIDECTOMY    .  TOTAL KNEE ARTHROPLASTY Right 02/22/2016   Procedure: RIGHT TOTAL KNEE ARTHROPLASTY;  Surgeon: Vickey Huger, MD;  Location: [facility];  Service: Orthopedics;  Laterality: Right;    Family History  Problem Relation Age of Onset  . Arthritis Father   . Coronary artery disease Father   . Hypertension Father   . Heart disease Father   . COPD Sister        smoker  . Blindness Sister        since birth  . Congestive Heart Failure Sister   . COPD Mother        smoker  . Breast cancer Paternal Aunt   . Diabetes Paternal Grandmother   . Colon cancer Neg Hx     No Known Allergies  Current Outpatient Medications on File Prior to Visit  Medication Sig Dispense Refill  . amitriptyline (ELAVIL) 25 MG tablet TAKE 1 TABLET BY MOUTH EVERY NIGHT AT BEDTIME 90 tablet 1  . amLODipine (NORVASC) 5 MG tablet TAKE 1 TABLET(5 MG) BY MOUTH DAILY 90 tablet 1  . aspirin EC 81 MG tablet Take 81 mg by mouth 3 (three) times a week.     . Biotin (BIOTIN 5000) 5 MG CAPS Take by mouth.    . Calcium Carbonate-Vit D-Min (CALCIUM 1200 PO) Take 1 tablet by mouth every  morning. 600mg  calcium and 500mg  vitamin D each tablet    . Cholecalciferol (VITAMIN D3) 2000 UNITS TABS Take 2,000 Units by mouth daily.     . cycloSPORINE (RESTASIS) 0.05 % ophthalmic emulsion Place 1 drop into both eyes every 12 (twelve) hours.     Marland Kitchen levothyroxine (SYNTHROID, LEVOTHROID) 50 MCG tablet TAKE 1 TABLET(50 MCG) BY MOUTH DAILY BEFORE BREAKFAST 90 tablet 1  . Multiple Vitamins-Minerals (ONE-A-DAY EXTRAS ANTIOXIDANT) CAPS Take 1 capsule by mouth daily.     Marland Kitchen omeprazole (PRILOSEC) 20 MG capsule TAKE 1 CAPSULE BY MOUTH EVERY DAY 90 capsule 1   No current facility-administered medications on file prior to visit.     BP 130/68 (BP Location: Left Arm, Patient Position: Sitting, Cuff Size: Normal)   Pulse 72   Temp 97.9 F (36.6 C) (Oral)   Ht 5\' 3"  (1.6 m)   Wt 185 lb (83.9 kg)   SpO2 97%   BMI 32.77 kg/m    Objective:   Physical Exam  Constitutional: She is oriented to person, place, and time. She appears well-developed and well-nourished.  Cardiovascular: Normal rate, regular rhythm and normal heart sounds.  No murmur heard. Pulmonary/Chest: Effort normal and breath sounds normal. No respiratory distress. She has no wheezes.  Neurological: She is alert and oriented to person, place, and time.  Skin: Skin is warm and dry.  Psychiatric: She has a normal mood and affect. Her behavior is normal. Judgment and thought content normal.          Assessment & Plan:  HTN- bp stable on amlodipine, continue same. Obtain follow up bmet.   GERD- stable on omeprazole. Continue same.   Hypothyroid- stable on synthroid, obtain follow up tsh.   She requests a DNR to be signed.  Form signed and give to the patient today. Advised pt to place on her refrigerator.

## 2018-08-13 NOTE — Patient Instructions (Signed)
Please schedule your next medicare wellness visit with me in 1 yr.  Continue to eat heart healthy diet (full of fruits, vegetables, whole grains, lean protein, water--limit salt, fat, and sugar intake) and increase physical activity as tolerated.   Rebekah Valentine , Thank you for taking time to come for your Medicare Wellness Visit. I appreciate your ongoing commitment to your health goals. Please review the following plan we discussed and let me know if I can assist you in the future.   These are the goals we discussed: Goals    . Lose weight (pt-stated)     Lose 20 lbs.   Portion control and exercise.         This is a list of the screening recommended for you and due dates:  Health Maintenance  Topic Date Due  . Flu Shot  04/19/2018  . Mammogram  08/14/2019*  . Colon Cancer Screening  01/12/2020  . Tetanus Vaccine  03/20/2021  . DEXA scan (bone density measurement)  Completed  .  Hepatitis C: One time screening is recommended by Center for Disease Control  (CDC) for  adults born from [birth year] through 1966.   Completed  . Pneumonia vaccines  Completed  *Topic was postponed. The date shown is not the original due date.    Health Maintenance for Postmenopausal Women Menopause is a normal process in which your reproductive ability comes to an end. This process happens gradually over a span of months to years, usually between the ages of 89 and 24. Menopause is complete when you have missed 12 consecutive menstrual periods. It is important to talk with your health care provider about some of the most common conditions that affect postmenopausal women, such as heart disease, cancer, and bone loss (osteoporosis). Adopting a healthy lifestyle and getting preventive care can help to promote your health and wellness. Those actions can also lower your chances of developing some of these common conditions. What should I know about menopause? During menopause, you may experience a number of symptoms,  such as:  Moderate-to-severe hot flashes.  Night sweats.  Decrease in sex drive.  Mood swings.  Headaches.  Tiredness.  Irritability.  Memory problems.  Insomnia.  Choosing to treat or not to treat menopausal changes is an individual decision that you make with your health care provider. What should I know about hormone replacement therapy and supplements? Hormone therapy products are effective for treating symptoms that are associated with menopause, such as hot flashes and night sweats. Hormone replacement carries certain risks, especially as you become older. If you are thinking about using estrogen or estrogen with progestin treatments, discuss the benefits and risks with your health care provider. What should I know about heart disease and stroke? Heart disease, heart attack, and stroke become more likely as you age. This may be due, in part, to the hormonal changes that your body experiences during menopause. These can affect how your body processes dietary fats, triglycerides, and cholesterol. Heart attack and stroke are both medical emergencies. There are many things that you can do to help prevent heart disease and stroke:  Have your blood pressure checked at least every 1-2 years. High blood pressure causes heart disease and increases the risk of stroke.  If you are 37-59 years old, ask your health care provider if you should take aspirin to prevent a heart attack or a stroke.  Do not use any tobacco products, including cigarettes, chewing tobacco, or electronic cigarettes. If you need help quitting,  ask your health care provider.  It is important to eat a healthy diet and maintain a healthy weight. ? Be sure to include plenty of vegetables, fruits, low-fat dairy products, and lean protein. ? Avoid eating foods that are high in solid fats, added sugars, or salt (sodium).  Get regular exercise. This is one of the most important things that you can do for your  health. ? Try to exercise for at least 150 minutes each week. The type of exercise that you do should increase your heart rate and make you sweat. This is known as moderate-intensity exercise. ? Try to do strengthening exercises at least twice each week. Do these in addition to the moderate-intensity exercise.  Know your numbers.Ask your health care provider to check your cholesterol and your blood glucose. Continue to have your blood tested as directed by your health care provider.  What should I know about cancer screening? There are several types of cancer. Take the following steps to reduce your risk and to catch any cancer development as early as possible. Breast Cancer  Practice breast self-awareness. ? This means understanding how your breasts normally appear and feel. ? It also means doing regular breast self-exams. Let your health care provider know about any changes, no matter how small.  If you are 30 or older, have a clinician do a breast exam (clinical breast exam or CBE) every year. Depending on your age, family history, and medical history, it may be recommended that you also have a yearly breast X-ray (mammogram).  If you have a family history of breast cancer, talk with your health care provider about genetic screening.  If you are at high risk for breast cancer, talk with your health care provider about having an MRI and a mammogram every year.  Breast cancer (BRCA) gene test is recommended for women who have family members with BRCA-related cancers. Results of the assessment will determine the need for genetic counseling and BRCA1 and for BRCA2 testing. BRCA-related cancers include these types: ? Breast. This occurs in males or females. ? Ovarian. ? Tubal. This may also be called fallopian tube cancer. ? Cancer of the abdominal or pelvic lining (peritoneal cancer). ? Prostate. ? Pancreatic.  Cervical, Uterine, and Ovarian Cancer Your health care provider may recommend  that you be screened regularly for cancer of the pelvic organs. These include your ovaries, uterus, and vagina. This screening involves a pelvic exam, which includes checking for microscopic changes to the surface of your cervix (Pap test).  For women ages 21-65, health care providers may recommend a pelvic exam and a Pap test every three years. For women ages 51-65, they may recommend the Pap test and pelvic exam, combined with testing for human papilloma virus (HPV), every five years. Some types of HPV increase your risk of cervical cancer. Testing for HPV may also be done on women of any age who have unclear Pap test results.  Other health care providers may not recommend any screening for nonpregnant women who are considered low risk for pelvic cancer and have no symptoms. Ask your health care provider if a screening pelvic exam is right for you.  If you have had past treatment for cervical cancer or a condition that could lead to cancer, you need Pap tests and screening for cancer for at least 20 years after your treatment. If Pap tests have been discontinued for you, your risk factors (such as having a new sexual partner) need to be reassessed to determine  if you should start having screenings again. Some women have medical problems that increase the chance of getting cervical cancer. In these cases, your health care provider may recommend that you have screening and Pap tests more often.  If you have a family history of uterine cancer or ovarian cancer, talk with your health care provider about genetic screening.  If you have vaginal bleeding after reaching menopause, tell your health care provider.  There are currently no reliable tests available to screen for ovarian cancer.  Lung Cancer Lung cancer screening is recommended for adults 34-71 years old who are at high risk for lung cancer because of a history of smoking. A yearly low-dose CT scan of the lungs is recommended if you:  Currently  smoke.  Have a history of at least 30 pack-years of smoking and you currently smoke or have quit within the past 15 years. A pack-year is smoking an average of one pack of cigarettes per day for one year.  Yearly screening should:  Continue until it has been 15 years since you quit.  Stop if you develop a health problem that would prevent you from having lung cancer treatment.  Colorectal Cancer  This type of cancer can be detected and can often be prevented.  Routine colorectal cancer screening usually begins at age 39 and continues through age 56.  If you have risk factors for colon cancer, your health care provider may recommend that you be screened at an earlier age.  If you have a family history of colorectal cancer, talk with your health care provider about genetic screening.  Your health care provider may also recommend using home test kits to check for hidden blood in your stool.  A small camera at the end of a tube can be used to examine your colon directly (sigmoidoscopy or colonoscopy). This is done to check for the earliest forms of colorectal cancer.  Direct examination of the colon should be repeated every 5-10 years until age 47. However, if early forms of precancerous polyps or small growths are found or if you have a family history or genetic risk for colorectal cancer, you may need to be screened more often.  Skin Cancer  Check your skin from head to toe regularly.  Monitor any moles. Be sure to tell your health care provider: ? About any new moles or changes in moles, especially if there is a change in a mole's shape or color. ? If you have a mole that is larger than the size of a pencil eraser.  If any of your family members has a history of skin cancer, especially at a young age, talk with your health care provider about genetic screening.  Always use sunscreen. Apply sunscreen liberally and repeatedly throughout the day.  Whenever you are outside, protect  yourself by wearing long sleeves, pants, a wide-brimmed hat, and sunglasses.  What should I know about osteoporosis? Osteoporosis is a condition in which bone destruction happens more quickly than new bone creation. After menopause, you may be at an increased risk for osteoporosis. To help prevent osteoporosis or the bone fractures that can happen because of osteoporosis, the following is recommended:  If you are 53-53 years old, get at least 1,000 mg of calcium and at least 600 mg of vitamin D per day.  If you are older than age 45 but younger than age 34, get at least 1,200 mg of calcium and at least 600 mg of vitamin D per day.  If  you are older than age 51, get at least 1,200 mg of calcium and at least 800 mg of vitamin D per day.  Smoking and excessive alcohol intake increase the risk of osteoporosis. Eat foods that are rich in calcium and vitamin D, and do weight-bearing exercises several times each week as directed by your health care provider. What should I know about how menopause affects my mental health? Depression may occur at any age, but it is more common as you become older. Common symptoms of depression include:  Low or sad mood.  Changes in sleep patterns.  Changes in appetite or eating patterns.  Feeling an overall lack of motivation or enjoyment of activities that you previously enjoyed.  Frequent crying spells.  Talk with your health care provider if you think that you are experiencing depression. What should I know about immunizations? It is important that you get and maintain your immunizations. These include:  Tetanus, diphtheria, and pertussis (Tdap) booster vaccine.  Influenza every year before the flu season begins.  Pneumonia vaccine.  Shingles vaccine.  Your health care provider may also recommend other immunizations. This information is not intended to replace advice given to you by your health care provider. Make sure you discuss any questions you  have with your health care provider. Document Released: 10/28/2005 Document Revised: 03/25/2016 Document Reviewed: 06/09/2015 Elsevier Interactive Patient Education  2018 [employer].

## 2018-08-13 NOTE — Progress Notes (Signed)
RN note is reviewed and agree.    [name] NP

## 2018-11-10 ENCOUNTER — Telehealth: Payer: Self-pay | Admitting: Family

## 2018-11-13 NOTE — Telephone Encounter (Signed)
Sent refill of amitriptyline, amlodipine, levothyroxine and omeprazole. Pt will be due for follow up with Melissa on 02/11/19.  Please call pt to schedule OV. Thank you!

## 2018-12-22 ENCOUNTER — Telehealth: Payer: Medicare Other | Admitting: Physician Assistant

## 2018-12-22 DIAGNOSIS — R300 Dysuria: Secondary | ICD-10-CM

## 2018-12-22 MED ORDER — CEPHALEXIN 500 MG PO CAPS: 500.0000 mg | ORAL_CAPSULE | Freq: Two times a day (BID) | ORAL | 0 refills | Status: AC

## 2018-12-22 NOTE — Progress Notes (Signed)

## 2018-12-22 NOTE — Progress Notes (Signed)
I have spent 5 minutes in review of e-visit questionnaire, review and updating patient chart, medical decision making and response to patient.   Chantalle Defilippo Cody Ichelle Harral, PA-C    

## 2019-01-02 ENCOUNTER — Other Ambulatory Visit: Payer: Self-pay | Admitting: Family Medicine

## 2019-01-02 ENCOUNTER — Other Ambulatory Visit: Payer: Self-pay

## 2019-01-02 ENCOUNTER — Encounter: Payer: Self-pay | Admitting: Family Medicine

## 2019-01-02 ENCOUNTER — Telehealth: Payer: Medicare Other | Admitting: Nurse Practitioner

## 2019-01-02 ENCOUNTER — Ambulatory Visit (INDEPENDENT_AMBULATORY_CARE_PROVIDER_SITE_OTHER): Payer: Medicare Other | Admitting: Family Medicine

## 2019-01-02 DIAGNOSIS — N3090 Cystitis, unspecified without hematuria: Secondary | ICD-10-CM

## 2019-01-02 DIAGNOSIS — N390 Urinary tract infection, site not specified: Secondary | ICD-10-CM

## 2019-01-02 DIAGNOSIS — E559 Vitamin D deficiency, unspecified: Secondary | ICD-10-CM

## 2019-01-02 DIAGNOSIS — R300 Dysuria: Secondary | ICD-10-CM

## 2019-01-02 MED ORDER — NITROFURANTOIN MONOHYD MACRO 100 MG PO CAPS: 100.0000 mg | ORAL_CAPSULE | Freq: Two times a day (BID) | ORAL | 0 refills | Status: AC

## 2019-01-02 NOTE — Progress Notes (Signed)
Based on what you shared with me it looks like you have recurrent UTI with back pain,that should be evaluated in a face to face office visit. You ned  To have a urinalysis nad urine culture to make sure you are treated properly.   NOTE: If you entered your credit card information for this eVisit, you will not be charged. You may see a "hold" on your card for the $30 but that hold will drop off and you will not have a charge processed.  If you are having a true medical emergency please call 911.  If you need an urgent face to face visit, [facility] has four urgent care centers for your convenience.  If you need care fast and have a high deductible or no insurance consider:   [email or web address] to reserve your spot online an avoid wait times  [facility] [address] [place], [place] [address] 8 am to 8 pm Monday-Friday 10 am to 4 pm Saturday-Sunday *Across the street from [employer]  [place], [address] 8 am to 5 pm Monday-Friday * In the [facility] on the [facility]   The following sites will take your  insurance:  . [facility] [place] a Provider at this Location  [address] [place], [place] [address] . 10 am to 8 pm Monday-Friday . 12 pm to 8 pm Saturday-Sunday   . [facility] at [place] a Provider at this Location  [address], [place], [place] [address] . 8 am to 8 pm Monday-Friday . 9 am to 6 pm Saturday . 11 am to 6 pm Sunday   . [facility] at [place] Get Driving Directions  [address] Suite [place],  [address] . 8 am to 8 pm Monday-Friday . 8 am to 4 pm Saturday-Sunday   Your e-visit answers were reviewed by a board certified advanced clinical practitioner to complete your  personal care plan.

## 2019-01-02 NOTE — Progress Notes (Signed)
vitamin

## 2019-01-02 NOTE — Addendum Note (Signed)
Addended by: Sharon Seller B on: 01/02/2019 04:04 PM   Modules accepted: Orders

## 2019-01-02 NOTE — Progress Notes (Signed)
CC: Dysuria  Rebekah Valentine is a 73 y.o. female here for possible UTI. Due to outbreak, we are interacting via web portal for an electronic face-to-face visit. I verified patient's ID using 2 identifiers.   Duration: Early April had s/s's, received Keflex and got better, started again this AM Symptoms: urinary frequency, urinary hesitancy, urinary retention and dysuria Denies: hematuria, fever, nausea, vomiting, flank pain, vaginal discharge Hx of recurrent UTI? No No new sexual partners.   ROS:  Constitutional: denies fever GU: As noted in HPI  Past Medical History:  Diagnosis Date  . Arthritis   . Breast cyst    recurrent  . COLONIC POLYPS, HX OF 11/2007  . GERD (gastroesophageal reflux disease)   . HEMORRHOIDS   . HYPERTENSION   . Hypothyroidism   . INSOMNIA   . Knee pain    Arthritis   . SKIN CANCER, HX OF   . TROCHANTERIC BURSITIS, RIGHT    Exam No conversational dyspnea Age appropriate judgment and insight Nml affect and mood  Cystitis - Plan: nitrofurantoin, macrocrystal-monohydrate, (MACROBID) 100 MG capsule  Orders as above. Ck UA nad culture, OK to start empiric abx after dropping off specimen. Renal function reviewed. No red flag s/s's. Stay hydrated. Seek immediate care if pt starts to develop fevers, new/worsening symptoms, uncontrollable N/V. F/u prn. The patient voiced understanding and agreement to the plan.  [place], DO 01/02/19 3:57 PM

## 2019-01-03 ENCOUNTER — Other Ambulatory Visit (INDEPENDENT_AMBULATORY_CARE_PROVIDER_SITE_OTHER): Payer: Medicare Other

## 2019-01-03 DIAGNOSIS — N390 Urinary tract infection, site not specified: Secondary | ICD-10-CM | POA: Diagnosis not present

## 2019-01-03 DIAGNOSIS — R300 Dysuria: Secondary | ICD-10-CM | POA: Diagnosis not present

## 2019-01-03 LAB — URINALYSIS, ROUTINE W REFLEX MICROSCOPIC
Bilirubin Urine: NEGATIVE
Ketones, ur: NEGATIVE
Nitrite: POSITIVE — AB
Specific Gravity, Urine: 1.015 (ref 1.000–1.030)
Total Protein, Urine: NEGATIVE
Urine Glucose: NEGATIVE
Urobilinogen, UA: 0.2 (ref 0.0–1.0)
pH: 6.5 (ref 5.0–8.0)

## 2019-01-05 LAB — URINE CULTURE
MICRO NUMBER:: 400527
SPECIMEN QUALITY:: ADEQUATE

## 2019-02-12 ENCOUNTER — Telehealth (INDEPENDENT_AMBULATORY_CARE_PROVIDER_SITE_OTHER): Payer: Medicare Other | Admitting: Family

## 2019-02-12 ENCOUNTER — Telehealth: Payer: Self-pay | Admitting: Family

## 2019-02-12 ENCOUNTER — Encounter: Payer: Self-pay | Admitting: Family

## 2019-02-12 ENCOUNTER — Other Ambulatory Visit: Payer: Self-pay

## 2019-02-12 VITALS — BP 132/81 | HR 89 | Ht 64.0 in | Wt 185.0 lb

## 2019-02-12 DIAGNOSIS — G4700 Insomnia, unspecified: Secondary | ICD-10-CM

## 2019-02-12 DIAGNOSIS — E039 Hypothyroidism, unspecified: Secondary | ICD-10-CM

## 2019-02-12 DIAGNOSIS — I10 Essential (primary) hypertension: Secondary | ICD-10-CM

## 2019-02-12 DIAGNOSIS — K219 Gastro-esophageal reflux disease without esophagitis: Secondary | ICD-10-CM

## 2019-02-12 MED ORDER — OMEPRAZOLE 20 MG PO CPDR: 20.0000 mg | DELAYED_RELEASE_CAPSULE | Freq: Every day | ORAL | 1 refills | Status: DC

## 2019-02-12 MED ORDER — LEVOTHYROXINE SODIUM 50 MCG PO TABS: ORAL_TABLET | ORAL | 1 refills | Status: DC

## 2019-02-12 MED ORDER — AMLODIPINE BESYLATE 5 MG PO TABS: ORAL_TABLET | ORAL | 1 refills | Status: DC

## 2019-02-12 MED ORDER — AMITRIPTYLINE HCL 25 MG PO TABS: 25.0000 mg | ORAL_TABLET | Freq: Every day | ORAL | 1 refills | Status: DC

## 2019-02-12 NOTE — Progress Notes (Signed)
Virtual Visit via Video Note  I connected with Rebekah Valentine on 02/12/19 at  8:20 AM EDT by a video enabled telemedicine application and verified that I am speaking with the correct person using two identifiers. This visit type was conducted due to national recommendations for restrictions regarding the COVID-19 Pandemic (e.g. social distancing).  This format is felt to be most appropriate for this patient at this time.   I discussed the limitations of evaluation and management by telemedicine and the availability of in person appointments. The patient expressed understanding and agreed to proceed.  Only the patient and myself were on today's video visit. The patient was at home and I was in my office at the time of today's visit.   History of Present Illness:  HTN- current blood pressure medications include amlodipine 5mg  once daily. BP Readings from Last 3 Encounters:  02/12/19 132/81  08/13/18 130/68  08/13/18 130/68    Hypothyroid- maintained on synthroid 50 mcg. Reports that her energy is good.   Lab Results  Component Value Date   TSH 1.24 02/14/2018   Insomnia- continues elavil 25mg .  Reports that she is sleeping well.   GERD- maintained on omeprazole 20 mg. Reports that her reflux is stable.    Observations/Objective:   Gen: Awake, alert, no acute distress Resp: Breathing is even and non-labored Psych: calm/pleasant demeanor Neuro: Alert and Oriented x 3, + facial symmetry, speech is clear.  Assessment and Plan:  GERD- stable on PPI, continue same.  Insomnia- stable on elavil. Continue same.   Hypothyroid- clinically stable on synthroid. Continue same. She is due for labs but would like to hold off for now due to covid and avoid potential exposure. Will aim for a 3 month follow up in the office with labs.   HTN- bp is stable on current meds. Continue same.     Follow Up Instructions:    I discussed the assessment and treatment plan with the patient. The  patient was provided an opportunity to ask questions and all were answered. The patient agreed with the plan and demonstrated an understanding of the instructions.   The patient was advised to call back or seek an in-person evaluation if the symptoms worsen or if the condition fails to improve as anticipated.    [name], NP

## 2019-02-12 NOTE — Telephone Encounter (Signed)
Return in about 3 months (around 05/15/2019) for follow up visit face to face  Left PT a message to call back

## 2019-02-18 ENCOUNTER — Telehealth (INDEPENDENT_AMBULATORY_CARE_PROVIDER_SITE_OTHER): Payer: Medicare Other | Admitting: Medical

## 2019-02-18 ENCOUNTER — Other Ambulatory Visit: Payer: Self-pay

## 2019-02-18 ENCOUNTER — Encounter: Payer: Self-pay | Admitting: Medical

## 2019-02-18 VITALS — BP 132/67

## 2019-02-18 DIAGNOSIS — R300 Dysuria: Secondary | ICD-10-CM

## 2019-02-18 DIAGNOSIS — N390 Urinary tract infection, site not specified: Secondary | ICD-10-CM

## 2019-02-18 MED ORDER — NITROFURANTOIN MONOHYD MACRO 100 MG PO CAPS: 100.0000 mg | ORAL_CAPSULE | Freq: Two times a day (BID) | ORAL | 0 refills | Status: DC

## 2019-02-18 NOTE — Progress Notes (Addendum)
Subjective:    Patient ID: Rebekah Valentine, female    DOB: 1946-08-30, 73 y.o.   MRN: [id number]  HPI  Virtual Visit via Telephone Note  I connected with Rebekah Valentine on 02/18/19 at  2:40 PM EDT by telephone and verified that I am speaking with the correct person using two identifiers.  Location: Patient: home Provider: office.   I discussed the limitations, risks, security and privacy concerns of performing an evaluation and management service by telephone and the availability of in person appointments. I also discussed with the patient that there may be a patient responsible charge related to this service. The patient expressed understanding and agreed to proceed.   History of Present Illness: Pt in today reporting urinary symptoms x 1 day. Started today. She already started on azostandard. She is drinking a lot of more water and cranberry juice.  Dysuria- yes. Frequent urination-yes Hesitancy-yes Suprapubic pressure-yes Fever-no chills-no Nausea-no Vomiting-no CVA pain-no History of UTI-yes. 3 in her life. Gross hematuria-no. No odor to urine.  Pt this past year took keflex for uti and then mid month had to take another antibiotic.    Observations/Objective: General- no acute distress. Pleasant, friendly, and normal speech.  Assessment and Plan:  Your appear to have a urinary tract infection. I am prescribing macrobid antibiotic for the probable infection(start tomorrow after urine culture sent out). During the interim if your signs and symptoms worsen rather than improving please notify us. We will notify your when the culture results are back.  Follow up in 7 days or as needed.  25 minutes spent with pt. 50% of time spent explaining treatment plan benefit of getting urine studies/culture before treatment vs just giving antibiotic and following clinical response. Answered pt questions as well. Follow Up Instructions:    I discussed the assessment and  treatment plan with the patient. The patient was provided an opportunity to ask questions and all were answered. The patient agreed with the plan and demonstrated an understanding of the instructions.   The patient was advised to call back or seek an in-person evaluation if the symptoms worsen or if the condition fails to improve as anticipated.     Mackie Pai, PA-C    Review of Systems  Constitutional: Negative for chills, fatigue and fever.  Respiratory: Negative for cough, chest tightness, shortness of breath and wheezing.   Cardiovascular: Negative for chest pain and palpitations.  Gastrointestinal: Positive for abdominal pain.       Suprapubic pressure.  Genitourinary: Positive for difficulty urinating, dysuria, frequency and urgency. Negative for flank pain, vaginal bleeding and vaginal pain.  Musculoskeletal: Negative for back pain.  Skin: Negative for rash.  Neurological: Negative for dizziness and light-headedness.  Hematological: Negative for adenopathy. Does not bruise/bleed easily.  Psychiatric/Behavioral: Negative for behavioral problems and confusion.   Past Medical History:  Diagnosis Date  . Arthritis   . Breast cyst    recurrent  . COLONIC POLYPS, HX OF 11/2007  . GERD (gastroesophageal reflux disease)   . HEMORRHOIDS   . HYPERTENSION   . Hypothyroidism   . INSOMNIA   . Knee pain    Arthritis   . SKIN CANCER, HX OF   . TROCHANTERIC BURSITIS, RIGHT      Social History   Socioeconomic History  . Marital status: Widowed    Spouse name: Not on file  . Number of children: Not on file  . Years of education: Not on file  . Highest education level:  Not on file  Occupational History  . Not on file  Social Needs  . Financial resource strain: Not on file  . Food insecurity:    Worry: Not on file    Inability: Not on file  . Transportation needs:    Medical: Not on file    Non-medical: Not on file  Tobacco Use  . Smoking status: Never Smoker  .  Smokeless tobacco: Never Used  . Tobacco comment: Married, lives with spouse. Retired asst. teacher-subs now K-5  Substance and Sexual Activity  . Alcohol use: Yes    Alcohol/week: 1.0 - 2.0 standard drinks    Types: 1 - 2 Glasses of wine per week    Comment: occassional glass of wine  . Drug use: No  . Sexual activity: Never  Lifestyle  . Physical activity:    Days per week: Not on file    Minutes per session: Not on file  . Stress: Not on file  Relationships  . Social connections:    Talks on phone: Not on file    Gets together: Not on file    Attends religious service: Not on file    Active member of club or organization: Not on file    Attends meetings of clubs or organizations: Not on file    Relationship status: Not on file  . Intimate partner violence:    Fear of current or ex partner: Not on file    Emotionally abused: Not on file    Physically abused: Not on file    Forced sexual activity: Not on file  Other Topics Concern  . 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 BASAL CELL CARCINOMA EXCISION     face  . colon polpectomy  11/2007   Dr. Fuller Plan  . HYSTEROSCOPY     polyp  . Lumpectomy Right 1990   breast  . TONSILLECTOMY  1957  . TONSILLECTOMY AND ADENOIDECTOMY    . TOTAL KNEE ARTHROPLASTY Right 02/22/2016   Procedure: RIGHT TOTAL KNEE ARTHROPLASTY;  Surgeon: Vickey Huger, MD;  Location: [facility];  Service: Orthopedics;  Laterality: Right;    Family History  Problem Relation Age of Onset  . Arthritis Father   . Coronary artery disease Father   . Hypertension Father   . Heart disease Father   . COPD Sister        smoker  . Blindness Sister        since birth  . Congestive Heart Failure Sister   . COPD Mother        smoker  . Breast cancer Paternal Aunt   .  Diabetes Paternal Grandmother   . Colon cancer Neg Hx     No Known Allergies  Current Outpatient Medications on File Prior to Visit  Medication Sig Dispense Refill  . amitriptyline (ELAVIL) 25 MG tablet Take 1 tablet (25 mg total) by mouth at bedtime. 90 tablet 1  . amLODipine (NORVASC) 5 MG tablet TAKE 1 TABLET(5 MG) BY MOUTH DAILY 90 tablet 1  . aspirin EC 81 MG tablet Take 81 mg by mouth 3 (three) times a week.     . Biotin (BIOTIN 5000) 5 MG CAPS Take by mouth.    . Calcium Carbonate-Vit D-Min (CALCIUM 1200 PO)  Take 1 tablet by mouth every morning. 600mg  calcium and 500mg  vitamin D each tablet    . Cholecalciferol (VITAMIN D3) 2000 UNITS TABS Take 2,000 Units by mouth daily.     . cycloSPORINE (RESTASIS) 0.05 % ophthalmic emulsion Place 1 drop into both eyes every 12 (twelve) hours.     Marland Kitchen levothyroxine (SYNTHROID) 50 MCG tablet TAKE 1 TABLET(50 MCG) BY MOUTH DAILY BEFORE BREAKFAST 90 tablet 1  . Multiple Vitamins-Minerals (ONE-A-DAY EXTRAS ANTIOXIDANT) CAPS Take 1 capsule by mouth daily.     Marland Kitchen omeprazole (PRILOSEC) 20 MG capsule Take 1 capsule (20 mg total) by mouth daily. 90 capsule 1   No current facility-administered medications on file prior to visit.     BP 132/67 Comment: Per pt chcked last week      Objective:   Physical Exam        Assessment & Plan:

## 2019-02-18 NOTE — Patient Instructions (Signed)
Your appear to have a urinary tract infection. I am prescribing macrobid antibiotic for the probable infection(start tomorrow after urine culture sent out). During the interim if your signs and symptoms worsen rather than improving please notify us. We will notify your when the culture results are back.  Follow up in 7 days or as needed.

## 2019-02-19 ENCOUNTER — Other Ambulatory Visit (INDEPENDENT_AMBULATORY_CARE_PROVIDER_SITE_OTHER): Payer: Medicare Other

## 2019-02-19 ENCOUNTER — Other Ambulatory Visit: Payer: Self-pay

## 2019-02-19 DIAGNOSIS — N390 Urinary tract infection, site not specified: Secondary | ICD-10-CM | POA: Diagnosis not present

## 2019-02-19 DIAGNOSIS — R300 Dysuria: Secondary | ICD-10-CM

## 2019-02-19 LAB — POC URINALSYSI DIPSTICK (AUTOMATED)
Blood, UA: NEGATIVE
Glucose, UA: NEGATIVE
Ketones, UA: NEGATIVE
Nitrite, UA: POSITIVE
Protein, UA: POSITIVE — AB
Spec Grav, UA: 1.02 (ref 1.010–1.025)
Urobilinogen, UA: 1 E.U./dL
pH, UA: 6 (ref 5.0–8.0)

## 2019-02-21 LAB — URINE CULTURE
MICRO NUMBER:: 528579
SPECIMEN QUALITY:: ADEQUATE

## 2019-04-29 ENCOUNTER — Ambulatory Visit (INDEPENDENT_AMBULATORY_CARE_PROVIDER_SITE_OTHER): Payer: Medicare Other | Admitting: Family

## 2019-04-29 VITALS — BP 142/83 | HR 93

## 2019-04-29 DIAGNOSIS — N390 Urinary tract infection, site not specified: Secondary | ICD-10-CM

## 2019-04-29 MED ORDER — CEPHALEXIN 500 MG PO CAPS: 500.0000 mg | ORAL_CAPSULE | Freq: Two times a day (BID) | ORAL | 0 refills | Status: DC

## 2019-04-29 MED ORDER — TRIMETHOPRIM 100 MG PO TABS: 100.0000 mg | ORAL_TABLET | Freq: Every day | ORAL | 1 refills | Status: DC

## 2019-04-29 NOTE — Progress Notes (Signed)
Virtual Visit via Telephone Note  I connected with Rebekah Valentine on 04/29/19 at 10:40 AM EDT by telephone and verified that I am speaking with the correct person using two identifiers.  Location: Patient: home Provider: home   I discussed the limitations, risks, security and privacy concerns of performing an evaluation and management service by telephone and the availability of in person appointments. I also discussed with the patient that there may be a patient responsible charge related to this service. The patient expressed understanding and agreed to proceed.   History of Present Illness:    Reports that she took azo which helped her symptoms through the night.  Symptoms started yesterda (dysuria and frequency).  Has been drinking a lot of water.  She denies low back pain. Denies hematuria, fever.  Denies chills. This is her 5th visit for UTI since April of this year.   Observations/Objective:   Gen: Awake, alert, no acute distress Resp: Breathing sounds even and non-labored Psych: calm/pleasant demeanor Neuro: Alert and Oriented x 3,  speech is clear.  Assessment and Plan:  Recurrent UTI- will rx with keflex x 5 days for acute UTI. Then transition to trimethoprim for UTI prophylaxis. Pt is advised to call if new/worsening symptoms or if symptoms fail to improve.  Pt verbalizes understanding.   Follow Up Instructions:    I discussed the assessment and treatment plan with the patient. The patient was provided an opportunity to ask questions and all were answered. The patient agreed with the plan and demonstrated an understanding of the instructions.   The patient was advised to call back or seek an in-person evaluation if the symptoms worsen or if the condition fails to improve as anticipated.  I provided 5 minutes of non-face-to-face time during this encounter.   [name], NP

## 2019-05-21 ENCOUNTER — Encounter: Payer: Self-pay | Admitting: Family

## 2019-05-21 ENCOUNTER — Other Ambulatory Visit: Payer: Self-pay

## 2019-05-21 ENCOUNTER — Ambulatory Visit: Payer: Medicare Other | Admitting: Family

## 2019-05-21 VITALS — BP 126/72 | HR 86 | Temp 97.0°F | Resp 16 | Ht 63.8 in | Wt 190.6 lb

## 2019-05-21 DIAGNOSIS — K219 Gastro-esophageal reflux disease without esophagitis: Secondary | ICD-10-CM | POA: Diagnosis not present

## 2019-05-21 DIAGNOSIS — E039 Hypothyroidism, unspecified: Secondary | ICD-10-CM | POA: Diagnosis not present

## 2019-05-21 DIAGNOSIS — Z23 Encounter for immunization: Secondary | ICD-10-CM | POA: Diagnosis not present

## 2019-05-21 DIAGNOSIS — I10 Essential (primary) hypertension: Secondary | ICD-10-CM

## 2019-05-21 DIAGNOSIS — N390 Urinary tract infection, site not specified: Secondary | ICD-10-CM | POA: Diagnosis not present

## 2019-05-21 LAB — BASIC METABOLIC PANEL
BUN: 24 mg/dL — ABNORMAL HIGH (ref 6–23)
CO2: 30 mEq/L (ref 19–32)
Calcium: 9.2 mg/dL (ref 8.4–10.5)
Chloride: 103 mEq/L (ref 96–112)
Creatinine, Ser: 1 mg/dL (ref 0.40–1.20)
GFR: 54.11 mL/min — ABNORMAL LOW (ref >60.00)
Glucose, Bld: 89 mg/dL (ref 70–99)
Potassium: 4.3 mEq/L (ref 3.5–5.1)
Sodium: 141 mEq/L (ref 135–145)

## 2019-05-21 LAB — TSH: TSH: 2.72 u[IU]/mL (ref 0.35–4.50)

## 2019-05-21 NOTE — Patient Instructions (Signed)
Please complete lab work prior to leaving.   

## 2019-05-21 NOTE — Progress Notes (Signed)
Subjective:    Patient ID: Rebekah Valentine, female    DOB: 04/05/1946, 73 y.o.   MRN: [id number]  HPI  Patient is a 73 yr old female who presents today for follow up.   HTN- Patient continues amlodipine BP Readings from Last 3 Encounters:  05/21/19 126/72  04/29/19 (!) 142/83  02/18/19 132/67   Hypothyroid- maintained on synthroid 55mcg.    Lab Results  Component Value Date   TSH 1.24 02/14/2018   GERD-  Reports that she takes prilosec daily.  Reports that she has symptoms if she forgets to take it.  Recurrent UTI- Continues trimethoprim for UTI prevention.    Review of Systems See HPI  Past Medical History:  Diagnosis Date  . Arthritis   . Breast cyst    recurrent  . COLONIC POLYPS, HX OF 11/2007  . GERD (gastroesophageal reflux disease)   . HEMORRHOIDS   . HYPERTENSION   . Hypothyroidism   . INSOMNIA   . Knee pain    Arthritis   . SKIN CANCER, HX OF   . TROCHANTERIC BURSITIS, RIGHT      Social History   Socioeconomic History  . Marital status: Widowed    Spouse name: Not on file  . Number of children: Not on file  . Years of education: Not on file  . Highest education level: Not on file  Occupational History  . Not on file  Social Needs  . Financial resource strain: Not on file  . Food insecurity    Worry: Not on file    Inability: Not on file  . Transportation needs    Medical: Not on file    Non-medical: Not on file  Tobacco Use  . Smoking status: Never Smoker  . Smokeless tobacco: Never Used  . Tobacco comment: Married, lives with spouse. Retired asst. teacher-subs now K-5  Substance and Sexual Activity  . Alcohol use: Yes    Alcohol/week: 1.0 - 2.0 standard drinks    Types: 1 - 2 Glasses of wine per week    Comment: occassional glass of wine  . Drug use: No  . Sexual activity: Never  Lifestyle  . Physical activity    Days per week: Not on file    Minutes per session: Not on file  . Stress: Not on file  Relationships  . Social  Herbalist on phone: Not on file    Gets together: Not on file    Attends religious service: Not on file    Active member of club or organization: Not on file    Attends meetings of clubs or organizations: Not on file    Relationship status: Not on file  . Intimate partner violence    Fear of current or ex partner: Not on file    Emotionally abused: Not on file    Physically abused: Not on file    Forced sexual activity: Not on file  Other Topics Concern  . 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 BASAL CELL CARCINOMA EXCISION     face  . colon polpectomy  11/2007   Dr. Fuller Plan  . HYSTEROSCOPY     polyp  . Lumpectomy Right 1990   breast  . TONSILLECTOMY  [place]    . TOTAL KNEE ARTHROPLASTY Right 02/22/2016   Procedure: RIGHT TOTAL KNEE ARTHROPLASTY;  Surgeon: Vickey Huger, MD;  Location: [facility];  Service: Orthopedics;  Laterality: Right;    Family History  Problem Relation Age of Onset  . Arthritis Father   . Coronary artery disease Father   . Hypertension Father   . Heart disease Father   . COPD Sister        smoker  . Blindness Sister        since birth  . Congestive Heart Failure Sister   . COPD Mother        smoker  . Breast cancer Paternal Aunt   . Diabetes Paternal Grandmother   . Colon cancer Neg Hx     No Known Allergies  Current Outpatient Medications on File Prior to Visit  Medication Sig Dispense Refill  . amitriptyline (ELAVIL) 25 MG tablet Take 1 tablet (25 mg total) by mouth at bedtime. 90 tablet 1  . amLODipine (NORVASC) 5 MG tablet TAKE 1 TABLET(5 MG) BY MOUTH DAILY 90 tablet 1  . aspirin EC 81 MG tablet Take 81 mg by mouth 3 (three) times a week.     . Biotin (BIOTIN 5000) 5 MG CAPS Take by mouth.    .  Calcium Carbonate-Vit D-Min (CALCIUM 1200 PO) Take 1 tablet by mouth every morning. 600mg  calcium and 500mg  vitamin D each tablet    . Cholecalciferol (VITAMIN D3) 2000 UNITS TABS Take 2,000 Units by mouth daily.     . cycloSPORINE (RESTASIS) 0.05 % ophthalmic emulsion Place 1 drop into both eyes every 12 (twelve) hours.     Marland Kitchen levothyroxine (SYNTHROID) 50 MCG tablet TAKE 1 TABLET(50 MCG) BY MOUTH DAILY BEFORE BREAKFAST 90 tablet 1  . Multiple Vitamins-Minerals (ONE-A-DAY EXTRAS ANTIOXIDANT) CAPS Take 1 capsule by mouth daily.     Marland Kitchen omeprazole (PRILOSEC) 20 MG capsule Take 1 capsule (20 mg total) by mouth daily. 90 capsule 1  . trimethoprim (TRIMPEX) 100 MG tablet Take 1 tablet (100 mg total) by mouth daily. For prevention of Urinary tract infection 90 tablet 1   No current facility-administered medications on file prior to visit.     BP 126/72 (BP Location: Right Arm, Patient Position: Sitting, Cuff Size: Small)   Pulse 86   Temp (!) 97 F (36.1 C) (Temporal)   Resp 16   Ht 5' 3.8" (1.621 m)   Wt 190 lb 9.6 oz (86.5 kg)   SpO2 99%   BMI 32.92 kg/m       Objective:   Physical Exam Constitutional:      Appearance: She is well-developed.  Neck:     Musculoskeletal: Neck supple.     Thyroid: No thyromegaly.  Cardiovascular:     Rate and Rhythm: Normal rate and regular rhythm.     Heart sounds: Normal heart sounds. No murmur.  Pulmonary:     Effort: Pulmonary effort is normal. No respiratory distress.     Breath sounds: Normal breath sounds. No wheezing.  Skin:    General: Skin is warm and dry.  Neurological:     Mental Status: She is alert and oriented to person, place, and time.  Psychiatric:        Behavior: Behavior normal.        Thought Content: Thought content normal.        Judgment: Judgment normal.           Assessment &  Plan:  HTN- bp stable on current dose of amlodipine. Continue same.  Recurrent UTI- now on trimethoprim for UTI prophylaxis- clinically  stable.  Continue same.   GERD- stable on PPI.  Continue same.   Hypothyroid- clinically stable on synthroid. Continue same.

## 2019-06-03 ENCOUNTER — Other Ambulatory Visit: Payer: Self-pay

## 2019-06-03 ENCOUNTER — Ambulatory Visit (INDEPENDENT_AMBULATORY_CARE_PROVIDER_SITE_OTHER): Payer: Medicare Other | Admitting: Family

## 2019-06-03 DIAGNOSIS — N390 Urinary tract infection, site not specified: Secondary | ICD-10-CM | POA: Diagnosis not present

## 2019-06-03 DIAGNOSIS — R300 Dysuria: Secondary | ICD-10-CM

## 2019-06-03 MED ORDER — CEPHALEXIN 500 MG PO CAPS: 500.0000 mg | ORAL_CAPSULE | Freq: Two times a day (BID) | ORAL | 0 refills | Status: DC

## 2019-06-03 NOTE — Progress Notes (Signed)
Virtual Visit via Telephone Note  I connected with Rebekah Valentine on 06/03/19 at 10:40 AM EDT by telephone and verified that I am speaking with the correct person using two identifiers.  Location: Patient: home Provider: home   I discussed the limitations, risks, security and privacy concerns of performing an evaluation and management service by telephone and the availability of in person appointments. I also discussed with the patient that there may be a patient responsible charge related to this service. The patient expressed understanding and agreed to proceed.   History of Present Illness:  Patient is a 73 yr old female who presents today to discuss possible recurrent UTI.  Pt reports that last she developed some dysuria.  When she got up this AM had dysuria and frequency as well as a lot of bladder pressure. Reports that she has been taking trimethoprim prophylaxis and has been taking additional hygiene measures to try to prevent infection.       Observations/Objective:   Gen: Awake, alert, no acute distress Resp: Breathing sounds even and non-labored Psych: calm/pleasant demeanor Neuro: Alert and Oriented x 3, speech is clear.  Assessment and Plan:  Recurrent UTI- she will come by the office to leave a urine specimen for culture. Will rx with keflex and refer to Urology for further evaluation. Continue trimethoprim.  Follow Up Instructions:    I discussed the assessment and treatment plan with the patient. The patient was provided an opportunity to ask questions and all were answered. The patient agreed with the plan and demonstrated an understanding of the instructions.   The patient was advised to call back or seek an in-person evaluation if the symptoms worsen or if the condition fails to improve as anticipated.  I provided 5 minutes of non-face-to-face time during this encounter.   Rebekah Pear, NP

## 2019-06-05 ENCOUNTER — Telehealth: Payer: Self-pay | Admitting: Family

## 2019-06-05 LAB — URINE CULTURE
MICRO NUMBER:: 877309
SPECIMEN QUALITY:: ADEQUATE

## 2019-06-05 MED ORDER — CIPROFLOXACIN HCL 250 MG PO TABS: 250.0000 mg | ORAL_TABLET | Freq: Two times a day (BID) | ORAL | 0 refills | Status: DC

## 2019-06-05 NOTE — Telephone Encounter (Signed)
Notified pt and she voices understanding. 

## 2019-06-05 NOTE — Telephone Encounter (Signed)
Please advise pt that culture shows that UTI is not going to be cured by keflex. She should d/c keflex and start cipro. Rx has been sent to her pharmacy.

## 2019-08-22 ENCOUNTER — Ambulatory Visit: Payer: Medicare Other | Admitting: *Deleted

## 2019-08-28 NOTE — Progress Notes (Signed)
Virtual Visit via Video Note  I connected with patient on 08/29/19 at 10:45 AM EST by audio enabled telemedicine application and verified that I am speaking with the correct person using two identifiers.   THIS ENCOUNTER IS A VIRTUAL VISIT DUE TO COVID-19 - PATIENT WAS NOT SEEN IN THE OFFICE. PATIENT HAS CONSENTED TO VIRTUAL VISIT / TELEMEDICINE VISIT   Location of patient: home  Location of provider: office  I discussed the limitations of evaluation and management by telemedicine and the availability of in person appointments. The patient expressed understanding and agreed to proceed.   Subjective:   Rebekah Valentine is a 73 y.o. female who presents for Medicare Annual (Subsequent) preventive examination.  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 Very involved in church throughout the week and on Sundays.  Review of Systems:  Home Safety/Smoke Alarms: Feels safe in home. Smoke alarms in place.  Lives in 1 story home. Son lives with her. Has a small dog named Tempie Donning.   Female:       Mammo- declines      Dexa scan- declines [facility]- 01/12/15. 5 yr recall    Objective:     Vitals: BP 140/75 Comment: pt reported vitals  Pulse 81   Temp (!) 97.4 F (36.3 C)   Wt 186 lb (84.4 kg)   BMI 32.13 kg/m   Body mass index is 32.13 kg/m.  Advanced Directives 08/29/2019 08/13/2018 02/11/2016 01/15/2016 12/15/2015 01/12/2015 12/22/2014  Does Patient Have a Medical Advance Directive? Yes Yes Yes Yes Yes Yes Yes  Type of Advance Directive Living will [place];Living will [place];Living will Living will [place];Living will Living will [place];Living will  Does patient want to make changes to medical advance directive? No - Patient declined - - No - Patient declined No - Patient declined - -  Copy of [place] in Chart? - Yes - validated most recent copy scanned in chart (See row information) (No Data) Yes No -  copy requested No - copy requested -    Tobacco Social History   Tobacco Use  Smoking Status Never Smoker  Smokeless Tobacco Never Used  Tobacco Comment   Married, lives with spouse. Retired asst. teacher-subs now [employer]     Counseling given: Not Answered Comment: Married, lives with spouse. 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 Arthritis   . Breast cyst    recurrent  . COLONIC POLYPS, HX OF 11/2007  . GERD (gastroesophageal reflux disease)   . HEMORRHOIDS   . HYPERTENSION   . Hypothyroidism   . INSOMNIA   . Knee pain    Arthritis   . SKIN CANCER, HX OF   . TROCHANTERIC BURSITIS, RIGHT    Past Surgical History:  Procedure Laterality Date  . BASAL CELL CARCINOMA EXCISION     face  . colon polpectomy  11/2007   Dr. Fuller Plan  . HYSTEROSCOPY     polyp  . Lumpectomy Right 1990   breast  . TONSILLECTOMY  1957  . TONSILLECTOMY AND ADENOIDECTOMY    . TOTAL KNEE ARTHROPLASTY Right 02/22/2016   Procedure: RIGHT TOTAL KNEE ARTHROPLASTY;  Surgeon: Vickey Huger, MD;  Location: [facility];  Service: Orthopedics;  Laterality: Right;   Family History  Problem Relation Age of Onset  . Arthritis Father   . Coronary artery disease Father   . Hypertension  Father   . Heart disease Father   . COPD Sister        smoker  . Blindness Sister        since birth  . Congestive Heart Failure Sister   . COPD Mother        smoker  . Breast cancer Paternal Aunt   . Diabetes Paternal Grandmother   . Colon cancer Neg Hx    Social History   Socioeconomic History  . Marital status: Widowed    Spouse name: Not on file  . Number of children: Not on file  . Years of education: Not on file  . Highest education level: Not on file  Occupational History  . Not on file  Tobacco Use  . Smoking status: Never Smoker  . Smokeless tobacco: Never Used  . Tobacco comment: Married, lives with spouse. Retired asst.  teacher-subs now K-5  Substance and Sexual Activity  . Alcohol use: Yes    Alcohol/week: 1.0 - 2.0 standard drinks    Types: 1 - 2 Glasses of wine per week    Comment: occassional glass of wine  . Drug use: No  . Sexual activity: Never  Other Topics Concern  . 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 Difficulty of Paying Living Expenses: Not on file  Food Insecurity:   . Worried About Charity fundraiser in the Last Year: Not on file  . Ran Out of Food in the Last Year: Not on file  Transportation Needs:   . Lack of Transportation (Medical): Not on file  . Lack of Transportation (Non-Medical): Not on file  Physical Activity:   . Days of Exercise per Week: Not on file  . Minutes of Exercise per Session: Not on file  Stress:   . Feeling of Stress : Not on file  Social Connections:   . Frequency of Communication with Friends and Family: Not on file  . Frequency of Social Gatherings with Friends and Family: Not on file  . Attends Religious Services: Not on file  . Active Member of Clubs or Organizations: Not on file  . Attends Archivist Meetings: Not on file  . Marital Status: Not on file    Outpatient Encounter Medications as of 08/29/2019  Medication Sig  . amitriptyline (ELAVIL) 25 MG tablet Take 1 tablet (25 mg total) by mouth at bedtime.  Marland Kitchen amLODipine (NORVASC) 5 MG tablet TAKE 1 TABLET(5 MG) BY MOUTH DAILY  . aspirin EC 81 MG tablet Take 81 mg by mouth 3 (three) times a week.   . Biotin (BIOTIN 5000) 5 MG CAPS Take by mouth.  . Calcium Carbonate-Vit D-Min (CALCIUM 1200 PO) Take 1 tablet by mouth every morning. 600mg  calcium and 500mg  vitamin D each tablet  . Cholecalciferol (VITAMIN D3) 2000 UNITS TABS Take 2,000 Units by mouth daily.   . cycloSPORINE  (RESTASIS) 0.05 % ophthalmic emulsion Place 1 drop into both eyes every 12 (twelve) hours.   Marland Kitchen levothyroxine (SYNTHROID) 50 MCG tablet TAKE 1 TABLET(50 MCG) BY MOUTH DAILY BEFORE BREAKFAST  . Multiple Vitamins-Minerals (ONE-A-DAY EXTRAS ANTIOXIDANT) CAPS Take 1 capsule by mouth daily.   Marland Kitchen omeprazole (PRILOSEC) 20 MG capsule Take 1 capsule (20 mg total) by mouth daily.  . [  DISCONTINUED] ciprofloxacin (CIPRO) 250 MG tablet Take 1 tablet (250 mg total) by mouth 2 (two) times daily.  . [DISCONTINUED] trimethoprim (TRIMPEX) 100 MG tablet Take 1 tablet (100 mg total) by mouth daily. For prevention of Urinary tract infection   No facility-administered encounter medications on file as of 08/29/2019.    Activities of Daily Living In your present state of health, do you have any difficulty performing the following activities: 08/29/2019  Hearing? N  Vision? N  Difficulty concentrating or making decisions? N  Walking or climbing stairs? N  Dressing or bathing? N  Doing errands, shopping? N  Preparing Food and eating ? N  Using the Toilet? N  In the past six months, have you accidently leaked urine? N  Do you have problems with loss of bowel control? N  Managing your Medications? N  Managing your Finances? N  Housekeeping or managing your Housekeeping? N  Some recent data might be hidden    Patient Care Team: [name], NP as PCP - General (Internal Medicine) Ladene Artist, MD as Consulting Physician (Gastroenterology) Danella Sensing, MD as Consulting Physician (Dermatology)    Assessment:   This is a routine wellness examination for Maryalice. Physical assessment deferred to PCP.  Exercise Activities and Dietary recommendations Current Exercise Habits: Home exercise routine, Type of exercise: walking(stationary bike), Time (Minutes): 30, Frequency (Times/Week): 3, Weekly Exercise (Minutes/Week): 90, Intensity: Mild, Exercise limited by: None identified Diet (meal preparation, eat  out, water intake, caffeinated beverages, dairy products, fruits and vegetables): well balanced      Goals    . Lose weight (pt-stated)     Lose 20 lbs.   Portion control and exercise.      . Patient Stated     Maintain current health.       Fall Risk Fall Risk  08/29/2019 08/13/2018 09/05/2017 07/27/2016 12/15/2015  Falls in the past year? 1 0 No No Yes  Comment - - - - Fell on uneven concrete. No major injuries.   Number falls in past yr: - - - - 1  Injury with Fall? - - - - No  Risk for fall due to : - - - - -  Follow up Education provided;Falls prevention discussed - - - Education provided;Falls prevention discussed   Depression Screen PHQ 2/9 Scores 08/29/2019 08/13/2018 09/05/2017 07/27/2016  PHQ - 2 Score 0 0 1 0     Cognitive Function Ad8 score reviewed for issues:  Issues making decisions:no  Less interest in hobbies / activities:no  Repeats questions, stories (family complaining):no  Trouble using ordinary gadgets (microwave, computer, phone):no  Forgets the month or year: no  Mismanaging finances: no  Remembering appts:no  Daily problems with thinking and/or memory:no Ad8 score is=0     MMSE - Mini Mental State Exam 12/15/2015  Orientation to time 5  Orientation to Place 5  Registration 3  Attention/ Calculation 5  Recall 3  Language- name 2 objects 2  Language- repeat 1  Language- follow 3 step command 3  Language- read & follow direction 1  Write a sentence 1  Copy design 1  Total score 30        Immunization History  Administered Date(s) Administered  . Fluad Quad(high Dose 65+) 05/21/2019  . Influenza Split 07/10/2012, 06/19/2014  . Influenza Whole 10/19/2009, 07/06/2010  . Influenza, High Dose Seasonal PF 06/19/2013, 07/27/2016, 05/30/2017  . Influenza-Unspecified 06/19/2014, 06/02/2015, 07/31/2018  . Pneumococcal Conjugate-13 10/13/2014  . Pneumococcal Polysaccharide-23 12/30/2008, 09/05/2017  .  Td 03/02/2002  . Tdap  03/21/2011  . Zoster 10/25/2007  . Zoster Recombinat (Shingrix) 11/20/2017, 01/23/2018     Screening Tests Health Maintenance  Topic Date Due  . MAMMOGRAM  01/25/2018  . COLONOSCOPY  01/12/2020  . TETANUS/TDAP  03/20/2021  . INFLUENZA VACCINE  Completed  . DEXA SCAN  Completed  . Hepatitis C Screening  Completed  . PNA vac Low Risk Adult  Completed      Plan:    Please schedule your next medicare wellness visit with me in 1 yr.  Continue to eat heart healthy diet (full of fruits, vegetables, whole grains, lean protein, water--limit salt, fat, and sugar intake) and increase physical activity as tolerated.  Continue doing brain stimulating activities (puzzles, reading, adult coloring books, staying active) to keep memory sharp.     I have personally reviewed and noted the following in the patient's chart:   . Medical and social history . Use of alcohol, tobacco or illicit drugs  . Current medications and supplements . Functional ability and status . Nutritional status . Physical activity . Advanced directives . List of other physicians . Hospitalizations, surgeries, and ER visits in previous 12 months . Vitals . Screenings to include cognitive, depression, and falls . Referrals and appointments  In addition, I have reviewed and discussed with patient certain preventive protocols, quality metrics, and best practice recommendations. A written personalized care plan for preventive services as well as general preventive health recommendations were provided to patient.     Shela Nevin, [place]  08/29/2019

## 2019-08-29 ENCOUNTER — Ambulatory Visit (INDEPENDENT_AMBULATORY_CARE_PROVIDER_SITE_OTHER): Payer: Medicare Other | Admitting: *Deleted

## 2019-08-29 ENCOUNTER — Encounter: Payer: Self-pay | Admitting: *Deleted

## 2019-08-29 ENCOUNTER — Other Ambulatory Visit: Payer: Self-pay

## 2019-08-29 VITALS — BP 140/75 | HR 81 | Temp 97.4°F | Wt 186.0 lb

## 2019-08-29 DIAGNOSIS — Z0000 Encounter for general adult medical examination without abnormal findings: Secondary | ICD-10-CM

## 2019-08-29 NOTE — Patient Instructions (Signed)
Please schedule your next medicare wellness visit with me in 1 yr.  Continue to eat heart healthy diet (full of fruits, vegetables, whole grains, lean protein, water--limit salt, fat, and sugar intake) and increase physical activity as tolerated.  Continue doing brain stimulating activities (puzzles, reading, adult coloring books, staying active) to keep memory sharp.    Rebekah Valentine , Thank you for taking time to come for your Medicare Wellness Visit. I appreciate your ongoing commitment to your health goals. Please review the following plan we discussed and let me know if I can assist you in the future.   These are the goals we discussed: Goals    . Lose weight (pt-stated)     Lose 20 lbs.   Portion control and exercise.      . Patient Stated     Maintain current health.       This is a list of the screening recommended for you and due dates:  Health Maintenance  Topic Date Due  . Mammogram  01/25/2018  . Colon Cancer Screening  01/12/2020  . Tetanus Vaccine  03/20/2021  . Flu Shot  Completed  . DEXA scan (bone density measurement)  Completed  .  Hepatitis C: One time screening is recommended by Center for Disease Control  (CDC) for  adults born from [birth year] through 1966.   Completed  . Pneumonia vaccines  Completed    Preventive Care 43 Years and Older, Female Preventive care refers to lifestyle choices and visits with your health care provider that can promote health and wellness. This includes:  A yearly physical exam. This is also called an annual well check.  Regular dental and eye exams.  Immunizations.  Screening for certain conditions.  Healthy lifestyle choices, such as diet and exercise. What can I expect for my preventive care visit? Physical exam Your health care provider will check:  Height and weight. These may be used to calculate body mass index (BMI), which is a measurement that tells if you are at a healthy weight.  Heart rate and blood pressure.   Your skin for abnormal spots. Counseling Your health care provider may ask you questions about:  Alcohol, tobacco, and drug use.  Emotional well-being.  Home and relationship well-being.  Sexual activity.  Eating habits.  History of falls.  Memory and ability to understand (cognition).  Work and work Statistician.  Pregnancy and menstrual history. What immunizations do I need?  Influenza (flu) vaccine  This is recommended every year. Tetanus, diphtheria, and pertussis (Tdap) vaccine  You may need a Td booster every 10 years. Varicella (chickenpox) vaccine  You may need this vaccine if you have not already been vaccinated. Zoster (shingles) vaccine  You may need this after age 65. Pneumococcal conjugate (PCV13) vaccine  One dose is recommended after age 52. Pneumococcal polysaccharide (PPSV23) vaccine  One dose is recommended after age 96. Measles, mumps, and rubella (MMR) vaccine  You may need at least one dose of MMR if you were born in 1957 or later. You may also need a second dose. Meningococcal conjugate (MenACWY) vaccine  You may need this if you have certain conditions. Hepatitis A vaccine  You may need this if you have certain conditions or if you travel or work in places where you may be exposed to hepatitis A. Hepatitis B vaccine  You may need this if you have certain conditions or if you travel or work in places where you may be exposed to hepatitis B. Haemophilus influenzae  type b (Hib) vaccine  You may need this if you have certain conditions. You may receive vaccines as individual doses or as more than one vaccine together in one shot (combination vaccines). Talk with your health care provider about the risks and benefits of combination vaccines. What tests do I need? Blood tests  Lipid and cholesterol levels. These may be checked every 5 years, or more frequently depending on your overall health.  Hepatitis C test.  Hepatitis B test.  Screening  Lung cancer screening. You may have this screening every year starting at age 69 if you have a 30-pack-year history of smoking and currently smoke or have quit within the past 15 years.  Colorectal cancer screening. All adults should have this screening starting at age 50 and continuing until age 71. Your health care provider may recommend screening at age 22 if you are at increased risk. You will have tests every 1-10 years, depending on your results and the type of screening test.  Diabetes screening. This is done by checking your blood sugar (glucose) after you have not eaten for a while (fasting). You may have this done every 1-3 years.  Mammogram. This may be done every 1-2 years. Talk with your health care provider about how often you should have regular mammograms.  BRCA-related cancer screening. This may be done if you have a family history of breast, ovarian, tubal, or peritoneal cancers. Other tests  Sexually transmitted disease (STD) testing.  Bone density scan. This is done to screen for osteoporosis. You may have this done starting at age 20. Follow these instructions at home: Eating and drinking  Eat a diet that includes fresh fruits and vegetables, whole grains, lean protein, and low-fat dairy products. Limit your intake of foods with high amounts of sugar, saturated fats, and salt.  Take vitamin and mineral supplements as recommended by your health care provider.  Do not drink alcohol if your health care provider tells you not to drink.  If you drink alcohol: ? Limit how much you have to 0-1 drink a day. ? Be aware of how much alcohol is in your drink. In the [place] one drink equals one 12 oz bottle of beer (355 mL), one 5 oz glass of wine (148 mL), or one 1 oz glass of hard liquor (44 mL). Lifestyle  Take daily care of your teeth and gums.  Stay active. Exercise for at least 30 minutes on 5 or more days each week.  Do not use any products that contain  nicotine or tobacco, such as cigarettes, e-cigarettes, and chewing tobacco. If you need help quitting, ask your health care provider.  If you are sexually active, practice safe sex. Use a condom or other form of protection in order to prevent STIs (sexually transmitted infections).  Talk with your health care provider about taking a low-dose aspirin or statin. What's next?  Go to your health care provider once a year for a well check visit.  Ask your health care provider how often you should have your eyes and teeth checked.  Stay up to date on all vaccines. This information is not intended to replace advice given to you by your health care provider. Make sure you discuss any questions you have with your health care provider. Document Released: 10/02/2015 Document Revised: 08/30/2018 Document Reviewed: 08/30/2018 Elsevier Patient Education  2020 [employer].

## 2019-10-13 ENCOUNTER — Ambulatory Visit: Payer: Medicare PPO | Attending: Internal Medicine

## 2019-10-13 DIAGNOSIS — Z23 Encounter for immunization: Secondary | ICD-10-CM | POA: Insufficient documentation

## 2019-10-13 NOTE — Progress Notes (Signed)
   Covid-19 Vaccination Clinic  Name:  SHARLEE POLSELLI    MRN: [id number] DOB: [birth year]  10/13/2019  Ms. Verney was observed post Covid-19 immunization for 15 minutes without incidence. She was provided with Vaccine Information Sheet and instruction to access the V-Safe system.   Ms. Baird was instructed to call 911 with any severe reactions post vaccine: Marland Kitchen Difficulty breathing  . Swelling of your face and throat  . A fast heartbeat  . A bad rash all over your body  . Dizziness and weakness    Immunizations Administered    Name Date Dose VIS Date Route   Pfizer COVID-19 Vaccine 10/13/2019 10:42 AM 0.3 mL 08/30/2019 Intramuscular   Manufacturer: [place]   Lot: [id number]   [place]: [id number]

## 2019-11-04 ENCOUNTER — Ambulatory Visit: Payer: Medicare PPO | Attending: Internal Medicine

## 2019-11-04 DIAGNOSIS — Z23 Encounter for immunization: Secondary | ICD-10-CM

## 2019-11-04 NOTE — Progress Notes (Signed)
   Covid-19 Vaccination Clinic  Name:  ZEPHA SALVINO    MRN: [id number] DOB: 1945/12/05  11/04/2019  Ms. Gowing was observed post Covid-19 immunization for 15 minutes without incidence. She was provided with Vaccine Information Sheet and instruction to access the V-Safe system.   Ms. Odens was instructed to call 911 with any severe reactions post vaccine: Marland Kitchen Difficulty breathing  . Swelling of your face and throat  . A fast heartbeat  . A bad rash all over your body  . Dizziness and weakness    Immunizations Administered    Name Date Dose VIS Date Route   Pfizer COVID-19 Vaccine 11/04/2019  8:27 AM 0.3 mL 08/30/2019 Intramuscular   Manufacturer: [place]   Lot: [id number]   [place]: [id number]

## 2019-11-12 ENCOUNTER — Telehealth: Payer: Self-pay | Admitting: Family

## 2019-11-12 ENCOUNTER — Other Ambulatory Visit: Payer: Self-pay

## 2019-11-12 MED ORDER — AMITRIPTYLINE HCL 25 MG PO TABS: 25.0000 mg | ORAL_TABLET | Freq: Every day | ORAL | 1 refills | Status: DC

## 2019-11-12 MED ORDER — AMLODIPINE BESYLATE 5 MG PO TABS: ORAL_TABLET | ORAL | 1 refills | Status: DC

## 2019-11-12 MED ORDER — LEVOTHYROXINE SODIUM 50 MCG PO TABS: ORAL_TABLET | ORAL | 1 refills | Status: DC

## 2019-11-12 MED ORDER — OMEPRAZOLE 20 MG PO CPDR: 20.0000 mg | DELAYED_RELEASE_CAPSULE | Freq: Every day | ORAL | 1 refills | Status: DC

## 2019-11-12 NOTE — Telephone Encounter (Signed)
Medication:amitriptyline (ELAVIL) 25 MG tablet  amLODipine (NORVASC) 5 MG tablet levothyroxine (SYNTHROID) 50 MCG tabl omeprazole (PRILOSEC) 20 MG capsule   Has the patient contacted their pharmacy? Yes.   (If no, request that the patient contact the pharmacy for the refill.) (If yes, when and what did the pharmacy advise?) Call provider no refill abv  Preferred Pharmacy (with phone number or street name):  [id number] [address], [address]  [place], [place] [address]  Phone:  [phone] Fax:  [phone]    Agent: Please be advised that RX refills may take up to 3 business days. We ask that you follow-up with your pharmacy.

## 2019-11-12 NOTE — Telephone Encounter (Signed)
Medication refills sent to pharmacy, Patient was scheduled for 6 months follow up

## 2019-11-26 ENCOUNTER — Ambulatory Visit: Payer: Medicare PPO | Admitting: Family

## 2019-11-26 ENCOUNTER — Encounter: Payer: Self-pay | Admitting: Family

## 2019-11-26 ENCOUNTER — Other Ambulatory Visit: Payer: Self-pay

## 2019-11-26 VITALS — BP 144/78 | HR 98 | Temp 96.6°F | Resp 16 | Ht 64.0 in | Wt 184.0 lb

## 2019-11-26 DIAGNOSIS — I10 Essential (primary) hypertension: Secondary | ICD-10-CM

## 2019-11-26 DIAGNOSIS — E039 Hypothyroidism, unspecified: Secondary | ICD-10-CM | POA: Diagnosis not present

## 2019-11-26 LAB — BASIC METABOLIC PANEL
BUN: 18 mg/dL (ref 6–23)
CO2: 32 mEq/L (ref 19–32)
Calcium: 10 mg/dL (ref 8.4–10.5)
Chloride: 100 mEq/L (ref 96–112)
Creatinine, Ser: 0.82 mg/dL (ref 0.40–1.20)
GFR: 67.94 mL/min (ref >60.00)
Glucose, Bld: 82 mg/dL (ref 70–99)
Potassium: 4.2 mEq/L (ref 3.5–5.1)
Sodium: 141 mEq/L (ref 135–145)

## 2019-11-26 LAB — TSH: TSH: 2.05 u[IU]/mL (ref 0.35–4.50)

## 2019-11-26 NOTE — Progress Notes (Signed)
Subjective:    Patient ID: Rebekah Valentine, female    DOB: 1946/06/17, 74 y.o.   MRN: [id number]  HPI   Patient is a 74 yr old female who presents today    HTN-current medications include amlodipine 5 mg. BP Readings from Last 3 Encounters:  11/26/19 (!) 144/78  08/29/19 140/75  05/21/19 126/72   Hypothyroid-patient is maintained on Synthroid 50 mcg once daily. Lab Results  Component Value Date   TSH 2.72 05/21/2019        Review of Systems    see HPI  Past Medical History:  Diagnosis Date  . Arthritis   . Breast cyst    recurrent  . COLONIC POLYPS, HX OF 11/2007  . GERD (gastroesophageal reflux disease)   . HEMORRHOIDS   . HYPERTENSION   . Hypothyroidism   . INSOMNIA   . Knee pain    Arthritis   . SKIN CANCER, HX OF   . TROCHANTERIC BURSITIS, RIGHT      Social History   Socioeconomic History  . Marital status: Widowed    Spouse name: Not on file  . Number of children: Not on file  . Years of education: Not on file  . Highest education level: Not on file  Occupational History  . Not on file  Tobacco Use  . Smoking status: Never Smoker  . Smokeless tobacco: Never Used  . Tobacco comment: Married, lives with spouse. Retired asst. teacher-subs now K-5  Substance and Sexual Activity  . Alcohol use: Yes    Alcohol/week: 1.0 - 2.0 standard drinks    Types: 1 - 2 Glasses of wine per week    Comment: occassional glass of wine  . Drug use: No  . Sexual activity: Never  Other Topics Concern  . 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 Difficulty of Paying Living Expenses: Not on file  Food Insecurity:   . Worried About Charity fundraiser in the Last Year: Not on file  . Ran Out of Food in the Last Year: Not on file  Transportation Needs:    . Lack of Transportation (Medical): Not on file  . Lack of Transportation (Non-Medical): Not on file  Physical Activity:   . Days of Exercise per Week: Not on file  . Minutes of Exercise per Session: Not on file  Stress:   . Feeling of Stress : Not on file  Social Connections:   . Frequency of Communication with Friends and Family: Not on file  . Frequency of Social Gatherings with Friends and Family: Not on file  . Attends Religious Services: Not on file  . Active Member of Clubs or Organizations: Not on file  . Attends Archivist Meetings: Not on file  . Marital Status: Not on file  Intimate Partner Violence:   . Fear of Current or Ex-Partner: Not on file  . Emotionally Abused: Not on file  . Physically Abused: Not on file  . Sexually Abused: Not on file    Past Surgical History:  Procedure Laterality Date  . BASAL CELL CARCINOMA EXCISION     face  . colon polpectomy  11/2007   Dr. Fuller Plan  . HYSTEROSCOPY     polyp  . Lumpectomy  Right 1990   breast  . TONSILLECTOMY  1957  . TONSILLECTOMY AND ADENOIDECTOMY    . TOTAL KNEE ARTHROPLASTY Right 02/22/2016   Procedure: RIGHT TOTAL KNEE ARTHROPLASTY;  Surgeon: Vickey Huger, MD;  Location: [facility];  Service: Orthopedics;  Laterality: Right;    Family History  Problem Relation Age of Onset  . Arthritis Father   . Coronary artery disease Father   . Hypertension Father   . Heart disease Father   . COPD Sister        smoker  . Blindness Sister        since birth  . Congestive Heart Failure Sister   . COPD Mother        smoker  . Breast cancer Paternal Aunt   . Diabetes Paternal Grandmother   . Colon cancer Neg Hx     No Known Allergies  Current Outpatient Medications on File Prior to Visit  Medication Sig Dispense Refill  . amitriptyline (ELAVIL) 25 MG tablet Take 1 tablet (25 mg total) by mouth at bedtime. 90 tablet 1  . amLODipine (NORVASC) 5 MG tablet TAKE 1 TABLET(5 MG) BY MOUTH DAILY 90 tablet 1  . aspirin  EC 81 MG tablet Take 81 mg by mouth 3 (three) times a week.     . Biotin (BIOTIN 5000) 5 MG CAPS Take by mouth.    . Calcium Carbonate-Vit D-Min (CALCIUM 1200 PO) Take 1 tablet by mouth every morning. 600mg  calcium and 500mg  vitamin D each tablet    . Cholecalciferol (VITAMIN D3) 2000 UNITS TABS Take 2,000 Units by mouth daily.     . cycloSPORINE (RESTASIS) 0.05 % ophthalmic emulsion Place 1 drop into both eyes every 12 (twelve) hours.     Marland Kitchen levothyroxine (SYNTHROID) 50 MCG tablet TAKE 1 TABLET(50 MCG) BY MOUTH DAILY BEFORE BREAKFAST 90 tablet 1  . Multiple Vitamins-Minerals (ONE-A-DAY EXTRAS ANTIOXIDANT) CAPS Take 1 capsule by mouth daily.     Marland Kitchen omeprazole (PRILOSEC) 20 MG capsule Take 1 capsule (20 mg total) by mouth daily. 90 capsule 1   No current facility-administered medications on file prior to visit.    BP (!) 144/78 (BP Location: Right Arm, Patient Position: Sitting, Cuff Size: Small)   Pulse 98   Temp (!) 96.6 F (35.9 C) (Temporal)   Resp 16   Ht 5\' 4"  (1.626 m)   Wt 184 lb (83.5 kg)   SpO2 100%   BMI 31.58 kg/m    Objective:   Physical Exam Constitutional:      Appearance: She is well-developed.  Cardiovascular:     Rate and Rhythm: Normal rate and regular rhythm.     Heart sounds: Normal heart sounds. No murmur.  Pulmonary:     Effort: Pulmonary effort is normal. No respiratory distress.     Breath sounds: Normal breath sounds. No wheezing.  Psychiatric:        Behavior: Behavior normal.        Thought Content: Thought content normal.        Judgment: Judgment normal.           Assessment & Plan:  Hypertension-blood pressure is acceptable for her age.  We will continue current dose of amlodipine.  Obtain follow-up BMET.  Hypothyroid-clinically stable on Synthroid.  Will obtain follow-up TSH.  This visit occurred during the SARS-CoV-2 public health emergency.  Safety protocols were in place, including screening questions prior to the visit, additional  usage of staff PPE, and extensive cleaning of exam room  while observing appropriate contact time as indicated for disinfecting solutions.

## 2019-11-26 NOTE — Patient Instructions (Signed)
Please complete lab work prior to leaving.   

## 2020-02-25 ENCOUNTER — Encounter: Payer: Self-pay | Admitting: Gastroenterology

## 2020-04-21 ENCOUNTER — Encounter: Payer: Self-pay | Admitting: Gastroenterology

## 2020-04-21 ENCOUNTER — Other Ambulatory Visit: Payer: Self-pay

## 2020-04-21 ENCOUNTER — Ambulatory Visit (AMBULATORY_SURGERY_CENTER): Payer: Self-pay | Admitting: *Deleted

## 2020-04-21 VITALS — Ht 64.0 in | Wt 190.0 lb

## 2020-04-21 DIAGNOSIS — Z86010 Personal history of colonic polyps: Secondary | ICD-10-CM

## 2020-04-21 MED ORDER — SUTAB 1479-225-188 MG PO TABS: 24.0000 | ORAL_TABLET | ORAL | 0 refills | Status: DC

## 2020-04-21 NOTE — Progress Notes (Signed)
11-04-2019 cov test    No egg or soy allergy known to patient  No issues with past sedation with any surgeries or procedures no intubation problems in the past  No diet pills per patient No home 02 use per patient  No blood thinners per patient  Pt denies issues with constipation  No A fib or A flutter  EMMI video to pt or MyChart  COVID 19 guidelines implemented in [facility] today  No FH Malignant Hyperthermia   Pt is a DNR- she was made aware in [facility] of our Living Will Policy- she verbalized understanding of this    [employer] Coupon given to pt in [facility] today , code to pharmacy   Due to the COVID-19 pandemic we are asking patients to follow these guidelines. Please only bring one care partner. Please be aware that your care partner may wait in the car in the parking lot or if they feel like they will be too hot to wait in the car, they may wait in the lobby on the 4th floor. All care partners are required to wear a mask the entire time (we do not have any that we can provide them), they need to practice social distancing, and we will do a Covid check for all patient's and care partners when you arrive. Also we will check their temperature and your temperature. If the care partner waits in their car they need to stay in the parking lot the entire time and we will call them on their cell phone when the patient is ready for discharge so they can bring the car to the front of the building. Also all patient's will need to wear a mask into building.

## 2020-05-03 ENCOUNTER — Other Ambulatory Visit: Payer: Self-pay | Admitting: Family

## 2020-05-05 ENCOUNTER — Other Ambulatory Visit: Payer: Self-pay

## 2020-05-05 ENCOUNTER — Encounter: Payer: Self-pay | Admitting: Gastroenterology

## 2020-05-05 ENCOUNTER — Ambulatory Visit (AMBULATORY_SURGERY_CENTER): Payer: Medicare PPO | Admitting: Gastroenterology

## 2020-05-05 VITALS — BP 148/67 | HR 79 | Temp 97.1°F | Resp 13 | Ht 64.0 in | Wt 190.0 lb

## 2020-05-05 DIAGNOSIS — Z86010 Personal history of colonic polyps: Secondary | ICD-10-CM | POA: Diagnosis not present

## 2020-05-05 MED ORDER — SODIUM CHLORIDE 0.9 % IV SOLN: 500.0000 mL | Freq: Once | INTRAVENOUS | Status: AC

## 2020-05-05 NOTE — Progress Notes (Signed)
Pt's states no medical or surgical changes since previsit or office visit. 

## 2020-05-05 NOTE — Op Note (Signed)
[facility] Patient Name: Rebekah Valentine Procedure Date: 05/05/2020 8:30 AM MRN: [id number] Endoscopist: Ladene Artist , MD Age: 74 Referring MD:  Date of Birth: 1946/08/31 Gender: Female Account #: [id number] Procedure:                Colonoscopy Indications:              Surveillance: Personal history of adenomatous                            polyps on last colonoscopy 5 years ago Medicines:                Monitored Anesthesia Care Procedure:                Pre-Anesthesia Assessment:                           - Prior to the procedure, a History and Physical                            was performed, and patient medications and                            allergies were reviewed. The patient's tolerance of                            previous anesthesia was also reviewed. The risks                            and benefits of the procedure and the sedation                            options and risks were discussed with the patient.                            All questions were answered, and informed consent                            was obtained. Prior Anticoagulants: The patient has                            taken no previous anticoagulant or antiplatelet                            agents. ASA Grade Assessment: II - A patient with                            mild systemic disease. After reviewing the risks                            and benefits, the patient was deemed in                            satisfactory condition to undergo the procedure.  After obtaining informed consent, the colonoscope                            was passed under direct vision. Throughout the                            procedure, the patient's blood pressure, pulse, and                            oxygen saturations were monitored continuously. The                            Colonoscope was introduced through the anus and                            advanced to the the cecum,  identified by                            appendiceal orifice and ileocecal valve. The                            ileocecal valve, appendiceal orifice, and rectum                            were photographed. The quality of the bowel                            preparation was excellent. The colonoscopy was                            performed without difficulty. The patient tolerated                            the procedure well. Scope In: 8:35:24 AM Scope Out: 7:82:42 AM Scope Withdrawal Time: 0 hours 9 minutes 6 seconds  Total Procedure Duration: 0 hours 11 minutes 35 seconds  Findings:                 Skin tags were found on perianal exam.                           A few small-mouthed diverticula were found in the                            left colon. There was no evidence of diverticular                            bleeding.                           Internal hemorrhoids were found during                            retroflexion. The hemorrhoids were moderate and  Grade I (internal hemorrhoids that do not prolapse).                           The exam was otherwise without abnormality on                            direct and retroflexion views. Complications:            No immediate complications. Estimated blood loss:                            None. Estimated Blood Loss:     Estimated blood loss: none. Impression:               - Perianal skin tags found on perianal exam.                           - Mild diverticulosis in the left colon.                           - Internal hemorrhoids.                           - The examination was otherwise normal on direct                            and retroflexion views.                           - No specimens collected. Recommendation:           - Patient has a contact number available for                            emergencies. The signs and symptoms of potential                            delayed complications  were discussed with the                            patient. Return to normal activities tomorrow.                            Written discharge instructions were provided to the                            patient.                           - Resume previous diet.                           - Continue present medications.                           - No repeat colonoscopy due to age and the absence  of colonic polyps. Ladene Artist, MD 05/05/2020 8:51:04 AM This report has been signed electronically.

## 2020-05-05 NOTE — Progress Notes (Signed)
pt tolerated well. VSS. awake and to recovery. Report given to RN.  

## 2020-05-05 NOTE — Patient Instructions (Signed)
Internal hemorrhoids and mild diverticulosis.  Resume previous medications.  Handouts on findings given to patient.    YOU HAD AN ENDOSCOPIC PROCEDURE TODAY AT [facility]:   Refer to the procedure report that was given to you for any specific questions about what was found during the examination.  If the procedure report does not answer your questions, please call your gastroenterologist to clarify.  If you requested that your care partner not be given the details of your procedure findings, then the procedure report has been included in a sealed envelope for you to review at your convenience later.  YOU SHOULD EXPECT: Some feelings of bloating in the abdomen. Passage of more gas than usual.  Walking can help get rid of the air that was put into your GI tract during the procedure and reduce the bloating. If you had a lower endoscopy (such as a colonoscopy or flexible sigmoidoscopy) you may notice spotting of blood in your stool or on the toilet paper. If you underwent a bowel prep for your procedure, you may not have a normal bowel movement for a few days.  Please Note:  You might notice some irritation and congestion in your nose or some drainage.  This is from the oxygen used during your procedure.  There is no need for concern and it should clear up in a day or so.  SYMPTOMS TO REPORT IMMEDIATELY:   Following lower endoscopy (colonoscopy or flexible sigmoidoscopy):  Excessive amounts of blood in the stool  Significant tenderness or worsening of abdominal pains  Swelling of the abdomen that is new, acute  Fever of 100F or higher   For urgent or emergent issues, a gastroenterologist can be reached at any hour by calling [phone]. Do not use MyChart messaging for urgent concerns.    DIET:  We do recommend a small meal at first, but then you may proceed to your regular diet.  Drink plenty of fluids but you should avoid alcoholic beverages for 24 hours.  ACTIVITY:  You  should plan to take it easy for the rest of today and you should NOT DRIVE or use heavy machinery until tomorrow (because of the sedation medicines used during the test).    FOLLOW UP: Our staff will call the number listed on your records 48-72 hours following your procedure to check on you and address any questions or concerns that you may have regarding the information given to you following your procedure. If we do not reach you, we will leave a message.  We will attempt to reach you two times.  During this call, we will ask if you have developed any symptoms of COVID 19. If you develop any symptoms (ie: fever, flu-like symptoms, shortness of breath, cough etc.) before then, please call [phone].  If you test positive for Covid 19 in the 2 weeks post procedure, please call and report this information to [place].    If any biopsies were taken you will be contacted by phone or by letter within the next 1-3 weeks.  Please call us at [phone] if you have not heard about the biopsies in 3 weeks.    SIGNATURES/CONFIDENTIALITY: You and/or your care partner have signed paperwork which will be entered into your electronic medical record.  These signatures attest to the fact that that the information above on your After Visit Summary has been reviewed and is understood.  Full responsibility of the confidentiality of this discharge information lies with you and/or your  care-partner.

## 2020-05-07 ENCOUNTER — Telehealth: Payer: Self-pay

## 2020-05-07 NOTE — Telephone Encounter (Signed)
Attempted to reach patient for post-procedure f/u call. Left message for her to please not hesitate to call us if she has any questions/concerns regarding her care. 

## 2020-05-07 NOTE — Telephone Encounter (Signed)
First attempt follow up call to pt, lm on vm 

## 2020-05-20 ENCOUNTER — Other Ambulatory Visit: Payer: Self-pay

## 2020-05-20 MED ORDER — OMEPRAZOLE 20 MG PO CPDR: 20.0000 mg | DELAYED_RELEASE_CAPSULE | Freq: Every day | ORAL | 1 refills | Status: DC

## 2020-05-29 ENCOUNTER — Ambulatory Visit: Payer: Medicare PPO | Admitting: Family

## 2020-06-01 ENCOUNTER — Other Ambulatory Visit: Payer: Self-pay

## 2020-06-01 ENCOUNTER — Ambulatory Visit: Payer: Medicare PPO | Admitting: Family

## 2020-06-01 ENCOUNTER — Encounter: Payer: Self-pay | Admitting: Family

## 2020-06-01 VITALS — BP 142/60 | HR 85 | Temp 98.6°F | Resp 16 | Ht 64.0 in | Wt 189.0 lb

## 2020-06-01 DIAGNOSIS — K219 Gastro-esophageal reflux disease without esophagitis: Secondary | ICD-10-CM

## 2020-06-01 DIAGNOSIS — I10 Essential (primary) hypertension: Secondary | ICD-10-CM | POA: Diagnosis not present

## 2020-06-01 DIAGNOSIS — E039 Hypothyroidism, unspecified: Secondary | ICD-10-CM

## 2020-06-01 DIAGNOSIS — G4700 Insomnia, unspecified: Secondary | ICD-10-CM | POA: Diagnosis not present

## 2020-06-01 DIAGNOSIS — Z23 Encounter for immunization: Secondary | ICD-10-CM | POA: Diagnosis not present

## 2020-06-01 NOTE — Progress Notes (Signed)
Subjective:    Patient ID: Rebekah Valentine, female    DOB: October 05, 1945, 74 y.o.   MRN: [id number]  HPI   Patient is a 74 yr old female who presents today for follow up.  HTN- maintained on amlodipine 5mg .  Denies LE edema.  BP Readings from Last 3 Encounters:  06/01/20 (!) 142/60  05/05/20 (!) 148/67  11/26/19 (!) 144/78   Hypothyroid- maintained on synthroid.   Lab Results  Component Value Date   TSH 2.05 11/26/2019   GERD- maintained on omeprazole daily. Reports symptoms are well controlled.   Insomnia- using elavil.  Reports sleeping well on this dose.     Review of Systems See HPI  Past Medical History:  Diagnosis Date  . Arthritis   . Breast cyst    recurrent  . COLONIC POLYPS, HX OF 11/2007  . GERD (gastroesophageal reflux disease)   . HEMORRHOIDS   . HYPERTENSION   . Hypothyroidism   . INSOMNIA   . Knee pain    Arthritis   . SKIN CANCER, HX OF   . TROCHANTERIC BURSITIS, RIGHT      Social History   Socioeconomic History  . Marital status: Widowed    Spouse name: Not on file  . Number of children: Not on file  . Years of education: Not on file  . Highest education level: Not on file  Occupational History  . Not on file  Tobacco Use  . Smoking status: Never Smoker  . Smokeless tobacco: Never Used  . Tobacco comment: Married, lives with spouse. Retired asst. teacher-subs now K-5  Substance and Sexual Activity  . Alcohol use: Not Currently    Alcohol/week: 1.0 - 2.0 standard drink    Types: 1 - 2 Glasses of wine per week    Comment: occassional glass of wine  . Drug use: No  . Sexual activity: Never  Other Topics Concern  . 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 Difficulty of Paying Living Expenses: Not on file  Food Insecurity:     . Worried About Charity fundraiser in the Last Year: Not on file  . Ran Out of Food in the Last Year: Not on file  Transportation Needs:   . Lack of Transportation (Medical): Not on file  . Lack of Transportation (Non-Medical): Not on file  Physical Activity:   . Days of Exercise per Week: Not on file  . Minutes of Exercise per Session: Not on file  Stress:   . Feeling of Stress : Not on file  Social Connections:   . Frequency of Communication with Friends and Family: Not on file  . Frequency of Social Gatherings with Friends and Family: Not on file  . Attends Religious Services: Not on file  . Active Member of Clubs or Organizations: Not on file  . Attends Archivist Meetings: Not on file  . Marital Status: Not on file  Intimate Partner Violence:   . Fear of Current or Ex-Partner: Not on file  . Emotionally Abused: Not on file  . Physically Abused: Not on file  . Sexually Abused: Not on file    Past Surgical History:  Procedure Laterality Date  . BASAL CELL CARCINOMA EXCISION  face  . colon polpectomy  11/2007   Dr. Fuller Plan  . COLONOSCOPY    . HYSTEROSCOPY     polyp  . Lumpectomy Right 1990   breast  . POLYPECTOMY    . TONSILLECTOMY  1957  . TOTAL KNEE ARTHROPLASTY Right 02/22/2016   Procedure: RIGHT TOTAL KNEE ARTHROPLASTY;  Surgeon: Vickey Huger, MD;  Location: [facility];  Service: Orthopedics;  Laterality: Right;    Family History  Problem Relation Age of Onset  . Arthritis Father   . Coronary artery disease Father   . Hypertension Father   . Heart disease Father   . COPD Sister        smoker  . Blindness Sister        since birth  . Congestive Heart Failure Sister   . COPD Mother        smoker  . Colon cancer Sister 16  . Breast cancer Paternal Aunt   . Diabetes Paternal Grandmother   . Colon polyps Neg Hx   . Esophageal cancer Neg Hx   . Rectal cancer Neg Hx   . Stomach cancer Neg Hx     No Known Allergies  Current Outpatient Medications on  File Prior to Visit  Medication Sig Dispense Refill  . amitriptyline (ELAVIL) 25 MG tablet TAKE 1 TABLET(25 MG) BY MOUTH AT BEDTIME 90 tablet 1  . amLODipine (NORVASC) 5 MG tablet TAKE 1 TABLET(5 MG) BY MOUTH DAILY 90 tablet 1  . aspirin EC 81 MG tablet Take 81 mg by mouth 3 (three) times a week.     . Biotin (BIOTIN 5000) 5 MG CAPS Take by mouth.    . Calcium Carbonate-Vit D-Min (CALCIUM 1200 PO) Take 1 tablet by mouth every morning. 600mg  calcium and 500mg  vitamin D each tablet    . Cholecalciferol (VITAMIN D3) 2000 UNITS TABS Take 2,000 Units by mouth daily.     . cycloSPORINE (RESTASIS) 0.05 % ophthalmic emulsion Place 1 drop into both eyes every 12 (twelve) hours.     Marland Kitchen levothyroxine (SYNTHROID) 50 MCG tablet TAKE 1 TABLET(50 MCG) BY MOUTH DAILY BEFORE BREAKFAST 90 tablet 1  . Multiple Vitamins-Minerals (ONE-A-DAY EXTRAS ANTIOXIDANT) CAPS Take 1 capsule by mouth daily.     Marland Kitchen omeprazole (PRILOSEC) 20 MG capsule Take 1 capsule (20 mg total) by mouth daily. 90 capsule 1   Current Facility-Administered Medications on File Prior to Visit  Medication Dose Route Frequency Provider Last Rate Last Admin  . 0.9 %  sodium chloride infusion  500 mL Intravenous Once Ladene Artist, MD        BP (!) 142/60 (BP Location: Right Arm, Patient Position: Sitting, Cuff Size: Small)   Pulse 85   Temp 98.6 F (37 C) (Oral)   Resp 16   Ht 5\' 4"  (1.626 m)   Wt 189 lb (85.7 kg)   SpO2 97%   BMI 32.44 kg/m       Objective:   Physical Exam Constitutional:      Appearance: She is well-developed.  Neck:     Thyroid: No thyromegaly.  Cardiovascular:     Rate and Rhythm: Normal rate and regular rhythm.     Heart sounds: Normal heart sounds. No murmur heard.   Pulmonary:     Effort: Pulmonary effort is normal. No respiratory distress.     Breath sounds: Normal breath sounds. No wheezing.  Musculoskeletal:     Cervical back: Neck supple.  Skin:    General: Skin is warm  and dry.  Neurological:      Mental Status: She is alert and oriented to person, place, and time.  Psychiatric:        Behavior: Behavior normal.        Thought Content: Thought content normal.        Judgment: Judgment normal.           Assessment & Plan:  HTN- bp stable on current dose of amlodipine. Continue same. Obtain follow up bmet.  GERD- stable on PPI. Continue same.  Hypothyroid- clinically stable on synthroid. Obtain follow up tsh.   Insomnia- stable on HS elavil. Continue same.  This visit occurred during the SARS-CoV-2 public health emergency.  Safety protocols were in place, including screening questions prior to the visit, additional usage of staff PPE, and extensive cleaning of exam room while observing appropriate contact time as indicated for disinfecting solutions.

## 2020-06-01 NOTE — Patient Instructions (Signed)
Please complete lab work prior to leaving.   

## 2020-06-02 LAB — BASIC METABOLIC PANEL
BUN: 14 mg/dL (ref 7–25)
CO2: 30 mmol/L (ref 20–32)
Calcium: 9.1 mg/dL (ref 8.6–10.4)
Chloride: 104 mmol/L (ref 98–110)
Creat: 0.87 mg/dL (ref 0.60–0.93)
Glucose, Bld: 94 mg/dL (ref 65–99)
Potassium: 4.6 mmol/L (ref 3.5–5.3)
Sodium: 142 mmol/L (ref 135–146)

## 2020-06-02 LAB — TSH: TSH: 1.52 mIU/L (ref 0.40–4.50)

## 2020-07-04 ENCOUNTER — Ambulatory Visit: Payer: Medicare PPO | Attending: Internal Medicine

## 2020-07-04 ENCOUNTER — Other Ambulatory Visit: Payer: Self-pay

## 2020-07-04 DIAGNOSIS — Z23 Encounter for immunization: Secondary | ICD-10-CM

## 2020-07-04 NOTE — Progress Notes (Signed)
   Covid-19 Vaccination Clinic  Name:  EVYNN BOUTELLE    MRN: [id number] DOB: 1946-05-27  07/04/2020  Ms. Linz was observed post Covid-19 immunization for 15 minutes without incident. She was provided with Vaccine Information Sheet and instruction to access the V-Safe system.   Ms. Fitting was instructed to call 911 with any severe reactions post vaccine: Marland Kitchen Difficulty breathing  . Swelling of face and throat  . A fast heartbeat  . A bad rash all over body  . Dizziness and weakness

## 2020-09-08 DIAGNOSIS — H25813 Combined forms of age-related cataract, bilateral: Secondary | ICD-10-CM | POA: Diagnosis not present

## 2020-09-08 DIAGNOSIS — H52203 Unspecified astigmatism, bilateral: Secondary | ICD-10-CM | POA: Diagnosis not present

## 2020-09-08 DIAGNOSIS — H524 Presbyopia: Secondary | ICD-10-CM | POA: Diagnosis not present

## 2020-10-30 ENCOUNTER — Other Ambulatory Visit: Payer: Self-pay | Admitting: Family

## 2020-11-12 ENCOUNTER — Telehealth: Payer: Self-pay | Admitting: Family

## 2020-11-12 DIAGNOSIS — N3001 Acute cystitis with hematuria: Secondary | ICD-10-CM | POA: Diagnosis not present

## 2020-11-12 NOTE — Telephone Encounter (Signed)
Patient states she has blood in urine. We have no available appt,. Offer appt in another location, pt decline. Patient would like to know if she could get prescription.

## 2020-11-12 NOTE — Telephone Encounter (Signed)
Patient was advised she needs to be evaluated in person, there are no appointments available in any of our locations. She was advised by the front desk to go to urgent care. She verbalized understanding and will seek in person evaluation today.

## 2020-11-27 ENCOUNTER — Ambulatory Visit: Payer: Medicare PPO | Admitting: Family

## 2020-11-27 ENCOUNTER — Other Ambulatory Visit: Payer: Self-pay

## 2020-11-27 VITALS — BP 143/73 | HR 84 | Temp 98.4°F | Resp 16 | Ht 64.0 in | Wt 184.0 lb

## 2020-11-27 DIAGNOSIS — E039 Hypothyroidism, unspecified: Secondary | ICD-10-CM

## 2020-11-27 DIAGNOSIS — K219 Gastro-esophageal reflux disease without esophagitis: Secondary | ICD-10-CM | POA: Diagnosis not present

## 2020-11-27 DIAGNOSIS — I10 Essential (primary) hypertension: Secondary | ICD-10-CM | POA: Diagnosis not present

## 2020-11-27 DIAGNOSIS — G4700 Insomnia, unspecified: Secondary | ICD-10-CM | POA: Diagnosis not present

## 2020-11-27 LAB — BASIC METABOLIC PANEL
BUN: 15 mg/dL (ref 6–23)
CO2: 29 mEq/L (ref 19–32)
Calcium: 9.3 mg/dL (ref 8.4–10.5)
Chloride: 103 mEq/L (ref 96–112)
Creatinine, Ser: 0.87 mg/dL (ref 0.40–1.20)
GFR: 64.85 mL/min (ref >60.00)
Glucose, Bld: 80 mg/dL (ref 70–99)
Potassium: 4.9 mEq/L (ref 3.5–5.1)
Sodium: 141 mEq/L (ref 135–145)

## 2020-11-27 LAB — TSH: TSH: 1.99 u[IU]/mL (ref 0.35–4.50)

## 2020-11-27 MED ORDER — TETANUS-DIPHTHERIA TOXOIDS TD 2-2 LF/0.5ML IM SUSP: 0.5000 mL | Freq: Once | INTRAMUSCULAR | 0 refills | Status: AC

## 2020-11-27 NOTE — Progress Notes (Signed)
Subjective:    Patient ID: Rebekah Valentine, female    DOB: [birth year], 75 y.o.   MRN: [id number]  HPI  Patient presents today for follow up.  HTN- maintained on amlodipine 5mg .   BP Readings from Last 3 Encounters:  11/27/20 (!) 143/73  06/01/20 (!) 142/60  05/05/20 (!) 148/67   Hypothyroid- maintained on synthroid 50 mcg once daily.  Lab Results  Component Value Date   TSH 1.52 06/01/2020   GERD- maintained on omeprazole 20mg .  Reports symptoms are well controlled.   Insomnia- uses amitryptyline HS for sleep.   Review of Systems   See HPI  Past Medical History:  Diagnosis Date  . Arthritis   . Breast cyst    recurrent  . COLONIC POLYPS, HX OF 11/2007  . GERD (gastroesophageal reflux disease)   . HEMORRHOIDS   . HYPERTENSION   . Hypothyroidism   . INSOMNIA   . Knee pain    Arthritis   . SKIN CANCER, HX OF   . TROCHANTERIC BURSITIS, RIGHT      Social History   Socioeconomic History  . Marital status: Widowed    Spouse name: Not on file  . Number of children: Not on file  . Years of education: Not on file  . Highest education level: Not on file  Occupational History  . Not on file  Tobacco Use  . Smoking status: Never Smoker  . Smokeless tobacco: Never Used  . Tobacco comment: Married, lives with spouse. Retired asst. teacher-subs now K-5  Substance and Sexual Activity  . Alcohol use: Not Currently    Alcohol/week: 1.0 - 2.0 standard drink    Types: 1 - 2 Glasses of wine per week    Comment: occassional glass of wine  . Drug use: No  . Sexual activity: Never  Other Topics Concern  . 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 BASAL CELL CARCINOMA EXCISION     face  . colon polpectomy  11/2007   Dr. Fuller Plan  . COLONOSCOPY    . HYSTEROSCOPY     polyp  . Lumpectomy Right 1990   breast  . POLYPECTOMY    . TONSILLECTOMY  1957  . TOTAL KNEE ARTHROPLASTY Right 02/22/2016   Procedure: RIGHT TOTAL KNEE ARTHROPLASTY;  Surgeon: Vickey Huger, MD;  Location: [facility];  Service: Orthopedics;  Laterality: Right;    Family History  Problem Relation Age of Onset  . Arthritis Father   . Coronary artery disease Father   . Hypertension Father   . Heart disease Father   . COPD Sister        smoker  . Blindness Sister        since birth  . Congestive Heart Failure Sister   . COPD Mother        smoker  . Colon cancer Sister 39  . Breast cancer Paternal Aunt   . Diabetes Paternal Grandmother   . Colon polyps Neg  Hx   . Esophageal cancer Neg Hx   . Rectal cancer Neg Hx   . Stomach cancer Neg Hx     No Known Allergies  Current Outpatient Medications on File Prior to Visit  Medication Sig Dispense Refill  . amitriptyline (ELAVIL) 25 MG tablet Take 1 tablet (25 mg total) by mouth at bedtime. 90 tablet 1  . amLODipine (NORVASC) 5 MG tablet Take 1 tablet (5 mg total) by mouth daily. 90 tablet 1  . aspirin EC 81 MG tablet Take 81 mg by mouth 3 (three) times a week.     . Biotin 5 MG CAPS Take by mouth.    . Calcium Carbonate-Vit D-Min (CALCIUM 1200 PO) Take 1 tablet by mouth every morning. 600mg  calcium and 500mg  vitamin D each tablet    . Cholecalciferol (VITAMIN D3) 2000 UNITS TABS Take 2,000 Units by mouth daily.     . cycloSPORINE (RESTASIS) 0.05 % ophthalmic emulsion Place 1 drop into both eyes every 12 (twelve) hours.    Marland Kitchen levothyroxine (SYNTHROID) 50 MCG tablet Take 1 tablet (50 mcg total) by mouth daily before breakfast. 90 tablet 1  . Multiple Vitamins-Minerals (ONE-A-DAY  EXTRAS ANTIOXIDANT) CAPS Take 1 capsule by mouth daily.    Marland Kitchen omeprazole (PRILOSEC) 20 MG capsule Take 1 capsule (20 mg total) by mouth daily. 90 capsule 3   Current Facility-Administered Medications on File Prior to Visit  Medication Dose Route Frequency Provider Last Rate Last Admin  . 0.9 %  sodium chloride infusion  500 mL Intravenous Once Ladene Artist, MD        BP (!) 143/73 (BP Location: Right Arm, Patient Position: Sitting, Cuff Size: Small)   Pulse 84   Temp 98.4 F (36.9 C) (Oral)   Resp 16   Ht 5\' 4"  (1.626 m)   Wt 184 lb (83.5 kg)   SpO2 100%   BMI 31.58 kg/m       Objective:   Physical Exam Constitutional:      Appearance: She is well-developed.  Cardiovascular:     Rate and Rhythm: Normal rate and regular rhythm.     Heart sounds: Normal heart sounds. No murmur heard.   Pulmonary:     Effort: Pulmonary effort is normal. No respiratory distress.     Breath sounds: Normal breath sounds. No wheezing.  Psychiatric:        Behavior: Behavior normal.        Thought Content: Thought content normal.        Judgment: Judgment normal.           Assessment & Plan:  HTN- bp at goal for her age. Continue amlodipine 5mg  once daily. Obtain follow up bmet.   Hypothyroid- clinically stable on synthroid 50 mcg. Obtain follow up tsh.  GERD- stable on omeprazole 20mg . Continue same.  Insomnia- stable on elavil. Continue same.  Due for tetanus- rx sent to pharmacy.  This visit occurred during the SARS-CoV-2 public health emergency.  Safety protocols were in place, including screening questions prior to the visit, additional usage of staff PPE, and extensive cleaning of exam room while observing appropriate contact time as indicated for disinfecting solutions.

## 2020-11-27 NOTE — Patient Instructions (Signed)
Please complete lab work prior to leaving.   

## 2020-12-16 ENCOUNTER — Encounter: Payer: Self-pay | Admitting: Family Medicine

## 2020-12-16 ENCOUNTER — Ambulatory Visit (INDEPENDENT_AMBULATORY_CARE_PROVIDER_SITE_OTHER): Payer: Medicare PPO | Admitting: Family Medicine

## 2020-12-16 ENCOUNTER — Other Ambulatory Visit: Payer: Self-pay

## 2020-12-16 ENCOUNTER — Telehealth: Payer: Self-pay | Admitting: Family

## 2020-12-16 VITALS — BP 148/82 | HR 75 | Temp 98.3°F | Ht 64.0 in | Wt 184.0 lb

## 2020-12-16 DIAGNOSIS — R350 Frequency of micturition: Secondary | ICD-10-CM | POA: Diagnosis not present

## 2020-12-16 DIAGNOSIS — R300 Dysuria: Secondary | ICD-10-CM

## 2020-12-16 MED ORDER — CIPROFLOXACIN HCL 250 MG PO TABS: 250.0000 mg | ORAL_TABLET | Freq: Two times a day (BID) | ORAL | 0 refills | Status: DC

## 2020-12-16 NOTE — Telephone Encounter (Signed)
Pt states possible UTI/ yeast infection. She is taking AZO  Would like to know if she can drop a urine sample off too see   Please advise

## 2020-12-16 NOTE — Progress Notes (Signed)
urin

## 2020-12-16 NOTE — Progress Notes (Signed)
Chief Complaint  Patient presents with  . Urinary Frequency    Back pain   . Dysuria    Rebekah Valentine is a 75 y.o. female here for possible UTI.  Duration: 2 days. Symptoms: Dysuria, urinary frequency, urinary hesitancy, urinary retention Denies: hematuria, urinary hesitancy, urinary retention, fever, nausea, vomiting and flank pain, vaginal discharge Hx of recurrent UTI? No  Past Medical History:  Diagnosis Date  . Arthritis   . Breast cyst    recurrent  . COLONIC POLYPS, HX OF 11/2007  . GERD (gastroesophageal reflux disease)   . HEMORRHOIDS   . HYPERTENSION   . Hypothyroidism   . INSOMNIA   . Knee pain    Arthritis   . SKIN CANCER, HX OF   . TROCHANTERIC BURSITIS, RIGHT      BP (!) 148/82 (BP Location: Left Arm, Patient Position: Sitting, Cuff Size: Normal)   Pulse 75   Temp 98.3 F (36.8 C) (Oral)   Ht 5\' 4"  (1.626 m)   Wt 184 lb (83.5 kg)   SpO2 98%   BMI 31.58 kg/m  General: Awake, alert, appears stated age Heart: RRR Lungs: CTAB, normal respiratory effort, no accessory muscle usage Abd: BS+, soft, NT, ND, no masses or organomegaly MSK: No CVA tenderness, neg Lloyd's sign Psych: Age appropriate judgment and insight  Dysuria - Plan: Urine Culture, ciprofloxacin (CIPRO) 250 MG tablet  Frequent urination - Plan: Urine Culture  Prior cx suggests resistance to most abx. Cipro works well for her, given hx of resistance, will send in 3 d course.  Stay hydrated. Seek immediate care if pt starts to develop fevers, new/worsening symptoms, uncontrollable N/V. OK w bp given age.  F/u prn. The patient voiced understanding and agreement to the plan.  [place], DO 12/16/20 4:34 PM

## 2020-12-16 NOTE — Patient Instructions (Signed)
Stay hydrated.   Warning signs/symptoms: Uncontrollable nausea/vomiting, fevers, worsening symptoms despite treatment, confusion.  Give us around 2 business days to get culture back to you.  Let us know if you need anything. 

## 2020-12-17 NOTE — Telephone Encounter (Signed)
Patient was seen by Dr Nani Ravens yesterday

## 2020-12-18 LAB — URINE CULTURE
MICRO NUMBER:: 11711186
SPECIMEN QUALITY:: ADEQUATE

## 2021-04-21 NOTE — Progress Notes (Signed)
Subjective:   Rebekah Valentine is a 75 y.o. female who presents for Medicare Annual (Subsequent) preventive examination.  I connected with Nateesha today by telephone and verified that I am speaking with the correct person using two identifiers. Location patient: home Location provider: work Persons participating in the virtual visit: patient, Marine scientist.    I discussed the limitations, risks, security and privacy concerns of performing an evaluation and management service by telephone and the availability of in person appointments. I also discussed with the patient that there may be a patient responsible charge related to this service. The patient expressed understanding and verbally consented to this telephonic visit.    Interactive audio and video telecommunications were attempted between this provider and patient, however failed, due to patient having technical difficulties OR patient did not have access to video capability.  We continued and completed visit with audio only.  Some vital signs may be absent or patient reported.   Time Spent with patient on telephone encounter: 20 minutes   Review of Systems     Cardiac Risk Factors include: advanced age (>85mn, >>89women);hypertension;obesity (BMI >30kg/m2)     Objective:    Today's Vitals   04/22/21 0820  BP: 124/77  Pulse: 83  Weight: 182 lb (82.6 kg)  Height: '5\' 4"'$  (1.626 m)   Body mass index is 31.24 kg/m.  Advanced Directives 04/22/2021 08/29/2019 08/13/2018 02/11/2016 01/15/2016 12/15/2015 01/12/2015  Does Patient Have a Medical Advance Directive? Yes Yes Yes Yes Yes Yes Yes  Type of AParamedicof [place] will Living will [place]Living will [place]Living will Living will [place]Living will Living will  Does patient want to make changes to medical advance directive? - No - Patient declined - - No - Patient declined No - Patient  declined -  Copy of [place] Chart? Yes - validated most recent copy scanned in chart (See row information) - Yes - validated most recent copy scanned in chart (See row information) (No Data) Yes No - copy requested No - copy requested    Current Medications (verified) Outpatient Encounter Medications as of 04/22/2021  Medication Sig   amitriptyline (ELAVIL) 25 MG tablet Take 1 tablet (25 mg total) by mouth at bedtime.   amLODipine (NORVASC) 5 MG tablet Take 1 tablet (5 mg total) by mouth daily.   aspirin EC 81 MG tablet Take 81 mg by mouth 3 (three) times a week.    Biotin 5 MG CAPS Take by mouth.   Calcium Carbonate-Vit D-Min (CALCIUM 1200 PO) Take 1 tablet by mouth every morning. '600mg'$  calcium and '500mg'$  vitamin D each tablet   Cholecalciferol (VITAMIN D3) 2000 UNITS TABS Take 2,000 Units by mouth daily.    ciprofloxacin (CIPRO) 250 MG tablet Take 1 tablet (250 mg total) by mouth 2 (two) times daily.   cycloSPORINE (RESTASIS) 0.05 % ophthalmic emulsion Place 1 drop into both eyes every 12 (twelve) hours.   levothyroxine (SYNTHROID) 50 MCG tablet Take 1 tablet (50 mcg total) by mouth daily before breakfast.   Multiple Vitamins-Minerals (ONE-A-DAY EXTRAS ANTIOXIDANT) CAPS Take 1 capsule by mouth daily.   omeprazole (PRILOSEC) 20 MG capsule Take 1 capsule (20 mg total) by mouth daily.   Facility-Administered Encounter Medications as of 04/22/2021  Medication   0.9 %  sodium chloride infusion    Allergies (verified) Patient has no known allergies.   History: Past Medical History:  Diagnosis Date  Arthritis    Breast cyst    recurrent   COLONIC POLYPS, HX OF 11/2007   GERD (gastroesophageal reflux disease)    HEMORRHOIDS    HYPERTENSION    Hypothyroidism    INSOMNIA    Knee pain    Arthritis    SKIN CANCER, HX OF    TROCHANTERIC BURSITIS, RIGHT    Past Surgical History:  Procedure Laterality Date   BASAL CELL CARCINOMA EXCISION     face   colon  polpectomy  11/2007   Dr. Fuller Plan   COLONOSCOPY     HYSTEROSCOPY     polyp   Lumpectomy Right 1990   breast   [place] Right 02/22/2016   Procedure: RIGHT TOTAL KNEE ARTHROPLASTY;  Surgeon: Vickey Huger, MD;  Location: [facility];  Service: Orthopedics;  Laterality: Right;   Family History  Problem Relation Age of Onset   Arthritis Father    Coronary artery disease Father    Hypertension Father    Heart disease Father    COPD Sister        smoker   Blindness Sister        since birth   Congestive Heart Failure Sister    COPD Mother        smoker   Colon cancer Sister 32   Breast cancer Paternal Aunt    Diabetes Paternal Grandmother    Colon polyps Neg Hx    Esophageal cancer Neg Hx    Rectal cancer Neg Hx    Stomach cancer Neg Hx    Social History   Socioeconomic History   Marital status: Widowed    Spouse name: Not on file   Number of children: Not on file   Years of education: Not on file   Highest education level: Not on file  Occupational History   Not on file  Tobacco Use   Smoking status: Never   Smokeless tobacco: Never   Tobacco comments:    Married, lives with spouse. Retired asst. 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   Reads/walks dog   Social Determinants of Health   Financial Resource Strain: Low Risk    Difficulty of Paying Living Expenses: Not hard at all  Food Insecurity: No Food Insecurity   Worried About Charity fundraiser in the Last Year: Never true   Ran Out of Food in the Last Year: Never true   Transportation Needs: No Transportation Needs   Lack of Transportation (Medical): No   Lack of Transportation (Non-Medical): No  Physical Activity: Insufficiently Active   Days of Exercise per Week: 7 days   Minutes of Exercise per Session: 20 min  Stress: No Stress Concern Present   Feeling of Stress : Not at all  Social Connections: Moderately Integrated   Frequency of Communication with Friends and Family: More than three times a week   Frequency of Social Gatherings with Friends and Family: More than  three times a week   Attends Religious Services: More than 4 times per year   Active Member of Clubs or Organizations: Yes   Attends Archivist Meetings: More than 4 times per year   Marital Status: Widowed    Tobacco Counseling Counseling given: Not Answered Tobacco comments: Married, lives with spouse. Retired asst. teacher-subs now K-5   Clinical Intake:  Pre-visit preparation completed: Yes  Pain : No/denies pain     Nutritional Status: BMI > 30  Obese Nutritional Risks: None Diabetes: No  How often do you need to have someone help you when you read instructions, pamphlets, or other written materials from your doctor or pharmacy?: 1 - Never  Diabetic?No  Interpreter Needed?: No  Information entered by :: Caroleen Hamman lpn   Activities of Daily Living In your present state of health, do you have any difficulty performing the following activities: 04/22/2021 11/27/2020  Hearing? N N  Vision? N N  Difficulty concentrating or making decisions? N N  Walking or climbing stairs? N N  Dressing or bathing? N N  Doing errands, shopping? N N  Preparing Food and eating ? N -  Using the Toilet? N -  In the past six months, have you accidently leaked urine? N -  Do you have problems with loss of bowel control? N -  Managing your Medications? N -  Managing your Finances? N -  Housekeeping or managing your Housekeeping? N -  Some recent data might be hidden     Patient Care Team: [name], NP as PCP - General (Internal Medicine) Ladene Artist, MD as Consulting Physician (Gastroenterology) Danella Sensing, MD as Consulting Physician (Dermatology)  Indicate any recent Medical Services you may have received from other than [facility] providers in the past year (date may be approximate).     Assessment:   This is a routine wellness examination for Anihya.  Hearing/Vision screen Hearing Screening - Comments:: No issues Vision Screening - Comments:: Last eye exam-08/2020-Dr.Shapiro  Dietary issues and exercise activities discussed: Current Exercise Habits: Home exercise routine, Type of exercise: walking, Time (Minutes): 15, Frequency (Times/Week): 7, Weekly Exercise (Minutes/Week): 105, Intensity: Mild   Goals Addressed             This Visit's Progress    Patient Stated   On track    Maintain current health.       Depression Screen PHQ 2/9 Scores 04/22/2021 11/27/2020 08/29/2019 08/13/2018 09/05/2017 07/27/2016 12/15/2015  PHQ - 2 Score 0 0 0 0 1 0 0    Fall Risk Fall Risk  04/22/2021 11/27/2020 08/29/2019 08/13/2018 09/05/2017  Falls in the past year? 0 1 1 0 No  Comment - - - - -  Number falls in past yr: 0 0 - - -  Injury with Fall? 0 0 - - -  Risk for fall due to : - - - - -  Follow up Falls prevention discussed - Education provided;Falls prevention discussed - -    FALL RISK PREVENTION PERTAINING TO THE HOME:  Any stairs in or around the home? No  Home free of loose throw rugs in walkways, pet beds, electrical cords, etc? Yes  Adequate lighting in your home to reduce risk of falls? Yes   ASSISTIVE DEVICES UTILIZED TO PREVENT FALLS:  Life alert? No  Use of a cane, walker or w/c? No  Grab bars in the bathroom? Yes  Shower chair or bench in shower? Yes  Elevated toilet seat or a  handicapped toilet? No   TIMED UP AND GO:  Was the test performed? No . Phone visit   Cognitive Function:Normal cognitive status  assessed by this Nurse Health Advisor. No abnormalities found.   MMSE - Mini Mental State Exam 12/15/2015  Orientation to time 5  Orientation to Place 5  Registration 3  Attention/ Calculation 5  Recall 3  Language- name 2 objects 2  Language- repeat 1  Language- follow 3 step command 3  Language- read & follow direction 1  Write a sentence 1  Copy design 1  Total score 30        Immunizations Immunization History  Administered Date(s) Administered   Fluad Quad(high Dose 65+) 05/21/2019, 06/01/2020   Influenza Split 07/10/2012, 06/19/2014   Influenza Whole 10/19/2009, 07/06/2010   Influenza, High Dose Seasonal PF 06/19/2013, 07/27/2016, 05/30/2017   Influenza-Unspecified 06/19/2014, 06/02/2015, 07/31/2018   PFIZER(Purple Top)SARS-COV-2 Vaccination 10/13/2019, 11/04/2019, 07/04/2020   Pneumococcal Conjugate-13 10/13/2014   Pneumococcal Polysaccharide-23 12/30/2008, 09/05/2017   Td 03/02/2002, 03/01/2021   Tdap 03/21/2011   Zoster Recombinat (Shingrix) 11/20/2017, 01/23/2018   Zoster, Live 10/25/2007    TDAP status: Up to date  Flu Vaccine status: Up to date  Pneumococcal vaccine status: Up to date  Covid-19 vaccine status: Information provided on how to obtain vaccines. Booster due  Qualifies for Shingles Vaccine? No   Zostavax completed Yes   Shingrix Completed?: Yes  Screening Tests Health Maintenance  Topic Date Due   COVID-19 Vaccine (4 - Booster for Pfizer series) 11/04/2020   INFLUENZA VACCINE  04/19/2021   TETANUS/TDAP  03/02/2031   DEXA SCAN  Completed   Hepatitis C Screening  Completed   PNA vac Low Risk Adult  Completed   Zoster Vaccines- Shingrix  Completed   HPV VACCINES  Aged Out    Health Maintenance  Health Maintenance Due  Topic Date Due   COVID-19 Vaccine (4 - Booster for Pfizer series) 11/04/2020   INFLUENZA VACCINE  04/19/2021    Colorectal cancer screening: No longer required.   Mammogram status: Declined  Bone Density status:  Declined  Lung Cancer Screening: (Low Dose CT Chest recommended if Age 27-80 years, 30 pack-year currently smoking OR have quit w/in 15years.) does not qualify.     Additional Screening:  Hepatitis C Screening: Completed 12/15/2015  Vision Screening: Recommended annual ophthalmology exams for early detection of glaucoma and other disorders of the eye. Is the patient up to date with their annual eye exam?  Yes  Who is the provider or what is the name of the office in which the patient attends annual eye exams? Dr. Gershon Crane   Dental Screening: Recommended annual dental exams for proper oral hygiene  Community Resource Referral / Chronic Care Management: CRR required this visit?  No   CCM required this visit?  No      Plan:     I have personally reviewed and noted the following in the patient's chart:   Medical and social history Use of alcohol, tobacco or illicit drugs  Current medications and supplements including opioid prescriptions.  Functional ability and status Nutritional status Physical activity Advanced directives List of other physicians Hospitalizations, surgeries, and ER visits in previous 12 months Vitals Screenings to include cognitive, depression, and falls Referrals and appointments  In addition, I have reviewed and discussed with patient certain preventive protocols, quality metrics, and best practice recommendations. A written personalized care plan for preventive services as well as general preventive health recommendations were provided to patient.  Due to this being a telephonic visit, the after visit summary with patients personalized plan was offered to patient via mail or my-chart. Patient declined at this time.   Marta Antu, LPN   [id number]  Nurse Health Advisor  Nurse Notes: None

## 2021-04-22 ENCOUNTER — Ambulatory Visit (INDEPENDENT_AMBULATORY_CARE_PROVIDER_SITE_OTHER): Payer: Medicare PPO

## 2021-04-22 VITALS — BP 124/77 | HR 83 | Ht 64.0 in | Wt 182.0 lb

## 2021-04-22 DIAGNOSIS — Z0000 Encounter for general adult medical examination without abnormal findings: Secondary | ICD-10-CM

## 2021-04-22 NOTE — Patient Instructions (Signed)
[name] , Thank you for taking time to complete  your Medicare Wellness Visit. I appreciate your ongoing commitment to your health goals. Please review the following plan we discussed and let me know if I can assist you in the future.   Screening recommendations/referrals: Colonoscopy: No longer required Mammogram: Declined today Bone Density: Declined today Recommended yearly ophthalmology/optometry visit for glaucoma screening and checkup Recommended yearly dental visit for hygiene and checkup  Vaccinations: Influenza vaccine: Up to date Pneumococcal vaccine: Up to date Tdap vaccine: Up to date-Due 03/02/2031 Shingles vaccine: Completed vaccines   Covid-19:2nd booster due  Advanced directives: Copy in chart  Conditions/risks identified: See problem list  Next appointment: Follow up in one year for your annual wellness visit    Preventive Care 76 Years and Older, Female Preventive care refers to lifestyle choices and visits with your health care provider that can promote health and wellness. What does preventive care include? A yearly physical exam. This is also called an annual well check. Dental exams once or twice a year. Routine eye exams. Ask your health care provider how often you should have your eyes checked. Personal lifestyle choices, including: Daily care of your teeth and gums. Regular physical activity. Eating a healthy diet. Avoiding tobacco and drug use. Limiting alcohol use. Practicing safe sex. Taking low-dose aspirin every day. Taking vitamin and mineral supplements as recommended by your health care provider. What happens during an annual well check? The services and screenings done by your health care provider during your annual well check will depend on your age, overall health, lifestyle risk factors, and family history of disease. Counseling  Your health care provider may ask you questions about your: Alcohol use. Tobacco use. Drug  use. Emotional well-being. Home and relationship well-being. Sexual activity. Eating habits. History of falls. Memory and ability to understand (cognition). Work and work Statistician. Reproductive health. Screening  You may have the following tests or measurements: Height, weight, and BMI. Blood pressure. Lipid and cholesterol levels. These may be checked every 5 years, or more frequently if you are over 58 years old. Skin check. Lung cancer screening. You may have this screening every year starting at age 41 if you have a 30-pack-year history of smoking and currently smoke or have quit within the past 15 years. Fecal occult blood test (FOBT) of the stool. You may have this test every year starting at age 58. Flexible sigmoidoscopy or colonoscopy. You may have a sigmoidoscopy every 5 years or a colonoscopy every 10 years starting at age 88. Hepatitis C blood test. Hepatitis B blood test. Sexually transmitted disease (STD) testing. Diabetes screening. This is done by checking your blood sugar (glucose) after you have not eaten for a while (fasting). You may have this done every 1-3 years. Bone density scan. This is done to screen for osteoporosis. You may have this done starting at age 108. Mammogram. This may be done every 1-2 years. Talk to your health care provider about how often you should have regular mammograms. Talk with your health care provider about your test results, treatment options, and if necessary, the need for more tests. Vaccines  Your health care provider may recommend certain vaccines, such as: Influenza vaccine. This is recommended every year. Tetanus, diphtheria, and acellular pertussis (Tdap, Td) vaccine. You may need a Td booster every 10 years. Zoster vaccine. You may need this after age 43. Pneumococcal 13-valent conjugate (PCV13) vaccine. One dose is recommended after age 40. Pneumococcal polysaccharide (PPSV23) vaccine. One  dose is recommended after age  24. Talk to your health care provider about which screenings and vaccines you need and how often you need them. This information is not intended to replace advice given to you by your health care provider. Make sure you discuss any questions you have with your health care provider. Document Released: 10/02/2015 Document Revised: 05/25/2016 Document Reviewed: 07/07/2015 Elsevier Interactive Patient Education  2017 [employer] Prevention in the Home Falls can cause injuries. They can happen to people of all ages. There are many things you can do to make your home safe and to help prevent falls. What can I do on the outside of my home? Regularly fix the edges of walkways and driveways and fix any cracks. Remove anything that might make you trip as you walk through a door, such as a raised step or threshold. Trim any bushes or trees on the path to your home. Use bright outdoor lighting. Clear any walking paths of anything that might make someone trip, such as rocks or tools. Regularly check to see if handrails are loose or broken. Make sure that both sides of any steps have handrails. Any raised decks and porches should have guardrails on the edges. Have any leaves, snow, or ice cleared regularly. Use sand or salt on walking paths during winter. Clean up any spills in your garage right away. This includes oil or grease spills. What can I do in the bathroom? Use night lights. Install grab bars by the toilet and in the tub and shower. Do not use towel bars as grab bars. Use non-skid mats or decals in the tub or shower. If you need to sit down in the shower, use a plastic, non-slip stool. Keep the floor dry. Clean up any water that spills on the floor as soon as it happens. Remove soap buildup in the tub or shower regularly. Attach bath mats securely with double-sided non-slip rug tape. Do not have throw rugs and other things on the floor that can make you trip. What can I do in the  bedroom? Use night lights. Make sure that you have a light by your bed that is easy to reach. Do not use any sheets or blankets that are too big for your bed. They should not hang down onto the floor. Have a firm chair that has side arms. You can use this for support while you get dressed. Do not have throw rugs and other things on the floor that can make you trip. What can I do in the kitchen? Clean up any spills right away. Avoid walking on wet floors. Keep items that you use a lot in easy-to-reach places. If you need to reach something above you, use a strong step stool that has a grab bar. Keep electrical cords out of the way. Do not use floor polish or wax that makes floors slippery. If you must use wax, use non-skid floor wax. Do not have throw rugs and other things on the floor that can make you trip. What can I do with my stairs? Do not leave any items on the stairs. Make sure that there are handrails on both sides of the stairs and use them. Fix handrails that are broken or loose. Make sure that handrails are as long as the stairways. Check any carpeting to make sure that it is firmly attached to the stairs. Fix any carpet that is loose or worn. Avoid having throw rugs at the top or bottom of the  stairs. If you do have throw rugs, attach them to the floor with carpet tape. Make sure that you have a light switch at the top of the stairs and the bottom of the stairs. If you do not have them, ask someone to add them for you. What else can I do to help prevent falls? Wear shoes that: Do not have high heels. Have rubber bottoms. Are comfortable and fit you well. Are closed at the toe. Do not wear sandals. If you use a stepladder: Make sure that it is fully opened. Do not climb a closed stepladder. Make sure that both sides of the stepladder are locked into place. Ask someone to hold it for you, if possible. Clearly mark and make sure that you can see: Any grab bars or  handrails. First and last steps. Where the edge of each step is. Use tools that help you move around (mobility aids) if they are needed. These include: Canes. Walkers. Scooters. Crutches. Turn on the lights when you go into a dark area. Replace any light bulbs as soon as they burn out. Set up your furniture so you have a clear path. Avoid moving your furniture around. If any of your floors are uneven, fix them. If there are any pets around you, be aware of where they are. Review your medicines with your doctor. Some medicines can make you feel dizzy. This can increase your chance of falling. Ask your doctor what other things that you can do to help prevent falls. This information is not intended to replace advice given to you by your health care provider. Make sure you discuss any questions you have with your health care provider. Document Released: 07/02/2009 Document Revised: 02/11/2016 Document Reviewed: 10/10/2014 Elsevier Interactive Patient Education  2017 [employer].

## 2021-04-28 ENCOUNTER — Other Ambulatory Visit: Payer: Self-pay | Admitting: Family

## 2021-05-31 ENCOUNTER — Ambulatory Visit: Payer: Medicare PPO | Admitting: Family

## 2021-05-31 ENCOUNTER — Encounter: Payer: Self-pay | Admitting: Family

## 2021-05-31 ENCOUNTER — Other Ambulatory Visit: Payer: Self-pay

## 2021-05-31 DIAGNOSIS — I10 Essential (primary) hypertension: Secondary | ICD-10-CM | POA: Diagnosis not present

## 2021-05-31 DIAGNOSIS — G4700 Insomnia, unspecified: Secondary | ICD-10-CM

## 2021-05-31 DIAGNOSIS — Z23 Encounter for immunization: Secondary | ICD-10-CM | POA: Diagnosis not present

## 2021-05-31 DIAGNOSIS — E039 Hypothyroidism, unspecified: Secondary | ICD-10-CM | POA: Diagnosis not present

## 2021-05-31 LAB — BASIC METABOLIC PANEL
BUN: 18 mg/dL (ref 6–23)
CO2: 27 mEq/L (ref 19–32)
Calcium: 8.9 mg/dL (ref 8.4–10.5)
Chloride: 105 mEq/L (ref 96–112)
Creatinine, Ser: 0.81 mg/dL (ref 0.40–1.20)
GFR: 70.4 mL/min (ref >60.00)
Glucose, Bld: 96 mg/dL (ref 70–99)
Potassium: 3.9 mEq/L (ref 3.5–5.1)
Sodium: 141 mEq/L (ref 135–145)

## 2021-05-31 LAB — TSH: TSH: 1.9 u[IU]/mL (ref 0.35–5.50)

## 2021-05-31 NOTE — Progress Notes (Signed)
Subjective:   By signing my name below, I, [name], attest that this documentation has been prepared under the direction and in the presence of [name] NP. 05/31/2021     Patient ID: Rebekah Valentine, female    DOB: 01/25/1946, 75 y.o.   MRN: [id number]  Chief Complaint  Patient presents with   Hypertension    Here for follow up    HPI Patient is in today for a office visit.  Blood pressure- Her blood pressure is doing well during this visit. She continues taking 5 mg amlodipine daily PO and reports on new issues while taking it.  BP Readings from Last 3 Encounters:  05/31/21 137/72  04/22/21 124/77  12/16/20 (!) 148/82   Thyroid- She continue taking 50 mcg synthroid daily PO and reports no new issues while taking it.  Reflux- She continues taking 20 mg Prilosec daily PO and reports no new issues while taking it. She notes that she has tried stopping it and her symptoms returned shortly.  Sleep- She continues taking 25 mg amitriptyline daily PO and reports no new issues while taking it.  Finger pain- She reports her finger pain has improved after she starting taking Tumeric PO.  Immunizations- She has taken the flu vaccine during this visit. She is UTD on shingles and tetanus vaccines. She has taken 3 Covid-19 vaccines. She is interested in getting he new Covid-19 booster vaccine after it releases in the fall season.    Health Maintenance Due  Topic Date Due   COVID-19 Vaccine (4 - Booster for [employer] series) 11/04/2020    Past Medical History:  Diagnosis Date   Arthritis    Breast cyst    recurrent   COLONIC POLYPS, HX OF 11/2007   GERD (gastroesophageal reflux disease)    HEMORRHOIDS    HYPERTENSION    Hypothyroidism    INSOMNIA    Knee pain    Arthritis    SKIN CANCER, HX OF    TROCHANTERIC BURSITIS, RIGHT     Past Surgical History:  Procedure Laterality Date   BASAL CELL CARCINOMA EXCISION     face   colon polpectomy  11/2007   Dr. Fuller Plan    COLONOSCOPY     HYSTEROSCOPY     polyp   Lumpectomy Right 1990   breast   [place] Right 02/22/2016   Procedure: RIGHT TOTAL KNEE ARTHROPLASTY;  Surgeon: Vickey Huger, MD;  Location: [facility];  Service: Orthopedics;  Laterality: Right;    Family History  Problem Relation Age of Onset   Arthritis Father    Coronary artery disease Father    Hypertension Father    Heart disease Father    COPD Sister        smoker   Blindness Sister        since birth   Congestive Heart Failure Sister    COPD Mother        smoker   Colon cancer Sister 65   Breast cancer Paternal Aunt    Diabetes Paternal Grandmother    Colon polyps Neg Hx    Esophageal cancer Neg Hx    Rectal cancer Neg Hx    Stomach cancer Neg Hx     Social History   Socioeconomic History   Marital status: Widowed    Spouse name: Not on file   Number of children: Not on file   Years of education: Not on  file   Highest education level: Not on file  Occupational History   Not on file  Tobacco Use   Smoking status: Never   Smokeless tobacco: Never   Tobacco comments:    Married, lives with spouse. Retired asst. 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   Reads/walks dog   Social Determinants of Health   Financial Resource Strain: Low Risk    Difficulty of Paying Living Expenses: Not hard at all  Food Insecurity: No Food Insecurity   Worried About Charity fundraiser in the Last Year: Never true   Ran Out of Food in the Last Year: Never true  Transportation Needs: No  Transportation Needs   Lack of Transportation (Medical): No   Lack of Transportation (Non-Medical): No  Physical Activity: Insufficiently Active   Days of Exercise per Week: 7 days   Minutes of Exercise per Session: 20 min  Stress: No Stress Concern Present   Feeling of Stress : Not at all  Social Connections: Moderately Integrated   Frequency of Communication with Friends and Family: More than three times a week   Frequency of Social Gatherings with Friends and Family: More than three times a week   Attends Religious Services: More than 4 times per year   Active Member of [employer] or Organizations: Yes   Attends Archivist Meetings: More than 4 times per year   Marital Status: Widowed  Human resources officer Violence: Not At Risk   Fear of Current or Ex-Partner: No   Emotionally Abused: No   Physically Abused: No   Sexually Abused: No    Outpatient Medications Prior to Visit  Medication Sig Dispense Refill   amitriptyline (ELAVIL) 25 MG tablet TAKE 1 TABLET(25 MG) BY MOUTH AT BEDTIME 90 tablet 1   amLODipine (NORVASC) 5 MG tablet TAKE 1 TABLET(5 MG) BY MOUTH DAILY 90 tablet 1   aspirin EC 81 MG tablet Take 81 mg by mouth 3 (three) times a week.      Biotin 5 MG CAPS Take by mouth.     Calcium Carbonate-Vit D-Min (CALCIUM 1200 PO) Take 1 tablet by mouth every morning. '600mg'$  calcium and '500mg'$  vitamin D each tablet     Cholecalciferol (VITAMIN D3) 2000 UNITS TABS Take 2,000 Units by mouth daily.      cycloSPORINE (RESTASIS) 0.05 % ophthalmic emulsion Place 1 drop into both eyes every 12 (twelve) hours.     levothyroxine (SYNTHROID) 50 MCG tablet TAKE 1 TABLET(50 MCG) BY MOUTH DAILY BEFORE AND BREAKFAST 90 tablet 1   Multiple Vitamins-Minerals (ONE-A-DAY EXTRAS ANTIOXIDANT) CAPS Take 1 capsule by mouth daily.     omeprazole (PRILOSEC) 20 MG capsule Take 1 capsule (20 mg total) by mouth daily. 90 capsule 3   Turmeric (QC TUMERIC COMPLEX PO) Take by mouth.     amLODipine (NORVASC) 5 MG  tablet TAKE 1 TABLET(5 MG) BY MOUTH DAILY 90 tablet 1   ciprofloxacin (CIPRO) 250 MG tablet Take 1 tablet (250 mg total) by mouth  2 (two) times daily. 6 tablet 0   Facility-Administered Medications Prior to Visit  Medication Dose Route Frequency Provider Last Rate Last Admin   0.9 %  sodium chloride infusion  500 mL Intravenous Once Ladene Artist, MD        No Known Allergies  ROS     Objective:    Physical Exam Constitutional:      General: She is not in acute distress.    Appearance: Normal appearance. She is not ill-appearing.  HENT:     Head: Normocephalic and atraumatic.     Right Ear: External ear normal.     Left Ear: External ear normal.  Eyes:     Extraocular Movements: Extraocular movements intact.     Pupils: Pupils are equal, round, and reactive to light.  Cardiovascular:     Rate and Rhythm: Normal rate and regular rhythm.     Heart sounds: Normal heart sounds. No murmur heard.   No gallop.  Pulmonary:     Effort: Pulmonary effort is normal. No respiratory distress.     Breath sounds: Normal breath sounds. No wheezing or rales.  Skin:    General: Skin is warm and dry.  Neurological:     Mental Status: She is alert and oriented to person, place, and time.  Psychiatric:        Behavior: Behavior normal.    BP 137/72 (BP Location: Right Arm, Patient Position: Sitting, Cuff Size: Small)   Pulse 91   Temp 98.6 F (37 C) (Oral)   Resp 16   Wt 183 lb (83 kg)   SpO2 100%   BMI 31.41 kg/m  Wt Readings from Last 3 Encounters:  05/31/21 183 lb (83 kg)  04/22/21 182 lb (82.6 kg)  12/16/20 184 lb (83.5 kg)       Assessment & Plan:   Problem List Items Addressed This Visit       Unprioritized   Insomnia    Stable on elavil '25mg'$ , continue same.      Hypothyroidism    Tolerating synthroid. Obtain follow up tsh.       Relevant Orders   TSH   HTN (hypertension)    BP Readings from Last 3 Encounters:  05/31/21 137/72  04/22/21 124/77  12/16/20  (!) 148/82  Stable on amlodipine '5mg'$  once daily.       Relevant Orders   Basic metabolic panel   Other Visit Diagnoses     Needs flu shot       Relevant Orders   Flu Vaccine QUAD High Dose(Fluad) (Completed)        No orders of the defined types were placed in this encounter.   I, [name] NP, personally preformed the services described in this documentation.  All medical record entries made by the scribe were at my direction and in my presence.  I have reviewed the chart and discharge instructions (if applicable) and agree that the record reflects my personal performance and is accurate and complete. 05/31/2021   I,[name],acting as a Education administrator for [name], NP.,have documented all relevant documentation on the behalf of [name], NP,as directed by  [name], NP while in the presence of [name], NP.   [name], NP

## 2021-05-31 NOTE — Patient Instructions (Signed)
Please complete lab work prior to leaving.   

## 2021-05-31 NOTE — Assessment & Plan Note (Addendum)
Tolerating synthroid 50 mcg.  Obtain follow up tsh.

## 2021-05-31 NOTE — Assessment & Plan Note (Signed)
Stable on elavil '25mg'$ , continue same.

## 2021-05-31 NOTE — Assessment & Plan Note (Addendum)
BP Readings from Last 3 Encounters:  05/31/21 137/72  04/22/21 124/77  12/16/20 (!) 148/82   Stable on amlodipine '5mg'$  once daily.

## 2021-07-06 ENCOUNTER — Other Ambulatory Visit: Payer: Self-pay

## 2021-07-06 ENCOUNTER — Ambulatory Visit: Payer: Medicare PPO | Admitting: Family

## 2021-07-06 VITALS — BP 148/70 | HR 87 | Temp 98.1°F | Resp 16 | Wt 183.0 lb

## 2021-07-06 DIAGNOSIS — N3001 Acute cystitis with hematuria: Secondary | ICD-10-CM | POA: Diagnosis not present

## 2021-07-06 LAB — POC URINALSYSI DIPSTICK (AUTOMATED)
Bilirubin, UA: NEGATIVE
Glucose, UA: NEGATIVE
Ketones, UA: NEGATIVE
Nitrite, UA: POSITIVE
Protein, UA: NEGATIVE
Spec Grav, UA: 1.01 (ref 1.010–1.025)
Urobilinogen, UA: 0.2 E.U./dL
pH, UA: 5 (ref 5.0–8.0)

## 2021-07-06 MED ORDER — CIPROFLOXACIN HCL 250 MG PO TABS: 250.0000 mg | ORAL_TABLET | Freq: Two times a day (BID) | ORAL | 0 refills | Status: AC

## 2021-07-06 NOTE — Patient Instructions (Signed)
Please begin cipro. We will let you know how your urine culture turns out.  Call if symptoms worsen or if not improved in 2-3 days.

## 2021-07-06 NOTE — Assessment & Plan Note (Signed)
New. She insists that cipro is the only antibiotic that she has tried that does not lead to recurrence after treatment. We did discuss increased risk of C diff with cipro and she verbalizes understanding.

## 2021-07-06 NOTE — Progress Notes (Signed)
Subjective:   By signing my name below, I, Lyric Barr-McArthur, attest that this documentation has been prepared under the direction and in the presence of [name], NP, 07/06/2021   Patient ID: Rebekah Valentine, female    DOB: 1946/07/09, 75 y.o.   MRN: [id number]  Chief Complaint  Patient presents with   Urinary Frequency    Complains of urinary frequency with urgency    HPI Patient is in today for an office visit.  Urination discomfort: She is experiencing discomfort, pressure and frequency with urination. She has had urinary tract infections before and notes that it feels the same as her previous ones. She mentions that 250 mg Cipro is the only antibiotic that got rid of her infection and kept it gone because with other antibiotics she experienced reoccurring urinary tract infections.   Health Maintenance Due  Topic Date Due   COVID-19 Vaccine (4 - Booster for [employer] series) 11/04/2020    Past Medical History:  Diagnosis Date   Arthritis    Breast cyst    recurrent   COLONIC POLYPS, HX OF 11/2007   GERD (gastroesophageal reflux disease)    HEMORRHOIDS    HYPERTENSION    Hypothyroidism    INSOMNIA    Knee pain    Arthritis    SKIN CANCER, HX OF    TROCHANTERIC BURSITIS, RIGHT     Past Surgical History:  Procedure Laterality Date   BASAL CELL CARCINOMA EXCISION     face   colon polpectomy  11/2007   Dr. Fuller Plan   COLONOSCOPY     HYSTEROSCOPY     polyp   Lumpectomy Right 1990   breast   [place] Right 02/22/2016   Procedure: RIGHT TOTAL KNEE ARTHROPLASTY;  Surgeon: Vickey Huger, MD;  Location: [facility];  Service: Orthopedics;  Laterality: Right;    Family History  Problem Relation Age of Onset   Arthritis Father    Coronary artery disease Father    Hypertension Father    Heart disease Father    COPD Sister        smoker   Blindness Sister        since birth   Congestive Heart Failure Sister     COPD Mother        smoker   Colon cancer Sister 73   Breast cancer Paternal Aunt    Diabetes Paternal Grandmother    Colon polyps Neg Hx    Esophageal cancer Neg Hx    Rectal cancer Neg Hx    Stomach cancer Neg Hx     Social History   Socioeconomic History   Marital status: Widowed    Spouse name: Not on file   Number of children: Not on file   Years of education: Not on file   Highest education level: Not on file  Occupational History   Not on file  Tobacco Use   Smoking status: Never   Smokeless tobacco: Never   Tobacco comments:    Married, lives with spouse. Retired asst. 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   Reads/walks dog   Social Determinants of Health   Financial Resource Strain: Low Risk    Difficulty of Paying Living Expenses: Not hard at all  Food Insecurity: No Food Insecurity   Worried About Charity fundraiser in the Last Year: Never true   Ran Out of Food in the Last Year: Never true  Transportation Needs: No Transportation Needs   Lack of Transportation (Medical): No   Lack of Transportation (Non-Medical): No  Physical Activity: Insufficiently Active   Days of Exercise per Week: 7 days   Minutes of Exercise per Session: 20 min  Stress: No Stress Concern Present   Feeling of Stress : Not at all  Social Connections: Moderately Integrated   Frequency of Communication with Friends and Family: More than three times a week   Frequency of Social Gatherings with Friends and Family: More than three times a week   Attends Religious Services: More than 4 times per  year   Active Member of [employer] or Organizations: Yes   Attends Archivist Meetings: More than 4 times per year   Marital Status: Widowed  Human resources officer Violence: Not At Risk   Fear of Current or Ex-Partner: No   Emotionally Abused: No   Physically Abused: No   Sexually Abused: No    Outpatient Medications Prior to Visit  Medication Sig Dispense Refill   amitriptyline (ELAVIL) 25 MG tablet TAKE 1 TABLET(25 MG) BY MOUTH AT BEDTIME 90 tablet 1   amLODipine (NORVASC) 5 MG tablet TAKE 1 TABLET(5 MG) BY MOUTH DAILY 90 tablet 1   aspirin EC 81 MG tablet Take 81 mg by mouth 3 (three) times a week.      Biotin 5 MG CAPS Take by mouth.     Calcium Carbonate-Vit D-Min (CALCIUM 1200 PO) Take 1 tablet by mouth every morning. 600mg  calcium and 500mg  vitamin D each tablet     Cholecalciferol (VITAMIN D3) 2000 UNITS TABS Take 2,000 Units by mouth daily.      cycloSPORINE (RESTASIS) 0.05 % ophthalmic emulsion Place 1 drop into both eyes every 12 (twelve) hours.     levothyroxine (SYNTHROID) 50 MCG tablet TAKE 1 TABLET(50 MCG) BY MOUTH DAILY BEFORE AND BREAKFAST 90 tablet 1   Multiple Vitamins-Minerals (ONE-A-DAY EXTRAS ANTIOXIDANT) CAPS Take 1 capsule by mouth daily.     omeprazole (PRILOSEC) 20 MG capsule Take 1 capsule (20 mg total) by mouth daily. 90 capsule 3   Turmeric (QC TUMERIC COMPLEX PO) Take by mouth.     Facility-Administered Medications Prior to Visit  Medication Dose Route Frequency Provider Last Rate Last Admin   0.9 %  sodium chloride infusion  500 mL Intravenous Once Ladene Artist, MD        No Known Allergies  Review of Systems  Genitourinary:  Positive for frequency and urgency. Negative for flank pain.      Objective:    Physical Exam Constitutional:      General: She is not in acute distress.    Appearance: Normal appearance. She is not ill-appearing.  HENT:     Head: Normocephalic and atraumatic.     Right Ear: External ear normal.     Left Ear:  External ear normal.  Eyes:     Extraocular Movements: Extraocular movements intact.     Pupils: Pupils are equal, round, and  reactive to light.  Cardiovascular:     Rate and Rhythm: Normal rate and regular rhythm.     Heart sounds: Normal heart sounds. No murmur heard.   No gallop.  Pulmonary:     Effort: Pulmonary effort is normal. No respiratory distress.     Breath sounds: Normal breath sounds. No wheezing or rales.  Abdominal:     Tenderness: There is no right CVA tenderness or left CVA tenderness.  Skin:    General: Skin is warm and dry.  Neurological:     Mental Status: She is alert and oriented to person, place, and time.  Psychiatric:        Behavior: Behavior normal.        Judgment: Judgment normal.    BP (!) 148/70 (BP Location: Right Arm, Patient Position: Sitting, Cuff Size: Small)   Pulse 87   Temp 98.1 F (36.7 C) (Oral)   Resp 16   Wt 183 lb (83 kg)   SpO2 99%   BMI 31.41 kg/m  Wt Readings from Last 3 Encounters:  07/06/21 183 lb (83 kg)  05/31/21 183 lb (83 kg)  04/22/21 182 lb (82.6 kg)       Assessment & Plan:   Problem List Items Addressed This Visit       Unprioritized   Acute cystitis with hematuria - Primary    New. She insists that cipro is the only antibiotic that she has tried that does not lead to recurrence after treatment. We did discuss increased risk of C diff with cipro and she verbalizes understanding.       Relevant Orders   POCT Urinalysis Dipstick (Automated) (Completed)   Urine Culture   Meds ordered this encounter  Medications   ciprofloxacin (CIPRO) 250 MG tablet    Sig: Take 1 tablet (250 mg total) by mouth 2 (two) times daily for 3 days.    Dispense:  6 tablet    Refill:  0    Order Specific Question:   Supervising Provider    Answer:   Penni Homans A [[id number]]    I, [name], NP, personally preformed the services described in this documentation.  All medical record entries made by the scribe were at my  direction and in my presence.  I have reviewed the chart and discharge instructions (if applicable) and agree that the record reflects my personal performance and/ is accurate and complete. 07/06/2021  I,Lyric Barr-McArthur,acting as a Education administrator for [name], NP.,have documented all relevant documentation on the behalf of [name], NP,as directed by  [name], NP while in the presence of [name], NP.  [name], NP

## 2021-07-08 LAB — URINE CULTURE
MICRO NUMBER:: 12517462
SPECIMEN QUALITY:: ADEQUATE

## 2021-08-28 DIAGNOSIS — N390 Urinary tract infection, site not specified: Secondary | ICD-10-CM | POA: Diagnosis not present

## 2021-09-09 DIAGNOSIS — H2513 Age-related nuclear cataract, bilateral: Secondary | ICD-10-CM | POA: Diagnosis not present

## 2021-09-09 DIAGNOSIS — H04123 Dry eye syndrome of bilateral lacrimal glands: Secondary | ICD-10-CM | POA: Diagnosis not present

## 2021-09-09 DIAGNOSIS — H524 Presbyopia: Secondary | ICD-10-CM | POA: Diagnosis not present

## 2021-09-09 DIAGNOSIS — H52203 Unspecified astigmatism, bilateral: Secondary | ICD-10-CM | POA: Diagnosis not present

## 2021-09-09 DIAGNOSIS — H25013 Cortical age-related cataract, bilateral: Secondary | ICD-10-CM | POA: Diagnosis not present

## 2021-10-15 ENCOUNTER — Telehealth: Payer: Self-pay | Admitting: Family

## 2021-10-15 NOTE — Telephone Encounter (Signed)
Pt stated she tested covid + yesterday, symptoms also started yesterday. She has a runny nose, sore throat, body aches, and she did have a fever yesterday but thinks it may have broken today. She declined a ov or vv and would like to see if she could get an antiviral or otc recommendations. Please advise.   [id number] [address], [address] AT [facility] OF [address]  [address] [place] [address]  Phone:  [phone]  Fax:  [phone]

## 2021-10-15 NOTE — Telephone Encounter (Signed)
Please advise ,   Patient declined appointments

## 2021-10-16 MED ORDER — MOLNUPIRAVIR 200 MG PO CAPS: 4.0000 | ORAL_CAPSULE | Freq: Two times a day (BID) | ORAL | 0 refills | Status: AC

## 2021-10-18 DIAGNOSIS — N3001 Acute cystitis with hematuria: Secondary | ICD-10-CM | POA: Diagnosis not present

## 2021-10-25 ENCOUNTER — Other Ambulatory Visit: Payer: Self-pay | Admitting: Family

## 2021-11-29 ENCOUNTER — Ambulatory Visit: Payer: Medicare PPO | Admitting: Family Medicine

## 2021-11-29 ENCOUNTER — Encounter: Payer: Self-pay | Admitting: Family Medicine

## 2021-11-29 VITALS — BP 126/78 | HR 85 | Temp 98.1°F | Resp 18 | Ht 64.0 in | Wt 174.2 lb

## 2021-11-29 DIAGNOSIS — E039 Hypothyroidism, unspecified: Secondary | ICD-10-CM | POA: Diagnosis not present

## 2021-11-29 DIAGNOSIS — I10 Essential (primary) hypertension: Secondary | ICD-10-CM

## 2021-11-29 LAB — COMPREHENSIVE METABOLIC PANEL
ALT: 10 U/L (ref 0–35)
AST: 14 U/L (ref 0–37)
Albumin: 4.3 g/dL (ref 3.5–5.2)
Alkaline Phosphatase: 53 U/L (ref 39–117)
BUN: 19 mg/dL (ref 6–23)
CO2: 30 mEq/L (ref 19–32)
Calcium: 9.1 mg/dL (ref 8.4–10.5)
Chloride: 103 mEq/L (ref 96–112)
Creatinine, Ser: 0.83 mg/dL (ref 0.40–1.20)
GFR: 68.13 mL/min (ref >60.00)
Glucose, Bld: 88 mg/dL (ref 70–99)
Potassium: 4 mEq/L (ref 3.5–5.1)
Sodium: 141 mEq/L (ref 135–145)
Total Bilirubin: 0.6 mg/dL (ref 0.2–1.2)
Total Protein: 6.1 g/dL (ref 6.0–8.3)

## 2021-11-29 LAB — LIPID PANEL
Cholesterol: 196 mg/dL (ref 0–200)
HDL: 56.2 mg/dL (ref >39.00)
LDL Cholesterol: 114 mg/dL — ABNORMAL HIGH (ref 0–99)
NonHDL: 140.14
Total CHOL/HDL Ratio: 3
Triglycerides: 131 mg/dL (ref 0.0–149.0)
VLDL: 26.2 mg/dL (ref 0.0–40.0)

## 2021-11-29 LAB — TSH: TSH: 2.25 u[IU]/mL (ref 0.35–5.50)

## 2021-11-29 NOTE — Assessment & Plan Note (Signed)
Check labs ?Con't synthroid  ?F/u 6 months for cpe ?

## 2021-11-29 NOTE — Assessment & Plan Note (Signed)
Well controlled, no changes to meds. Encouraged heart healthy diet such as the DASH diet and exercise as tolerated.  °

## 2021-11-29 NOTE — Patient Instructions (Signed)
DASH Eating Plan °DASH stands for Dietary Approaches to Stop Hypertension. The DASH eating plan is a healthy eating plan that has been shown to: °Reduce high blood pressure (hypertension). °Reduce your risk for type 2 diabetes, heart disease, and stroke. °Help with weight loss. °What are tips for following this plan? °Reading food labels °Check food labels for the amount of salt (sodium) per serving. Choose foods with less than 5 percent of the Daily Value of sodium. Generally, foods with less than 300 milligrams (mg) of sodium per serving fit into this eating plan. °To find whole grains, look for the word "whole" as the first word in the ingredient list. °Shopping °Buy products labeled as "low-sodium" or "no salt added." °Buy fresh foods. Avoid canned foods and pre-made or frozen meals. °Cooking °Avoid adding salt when cooking. Use salt-free seasonings or herbs instead of table salt or sea salt. Check with your health care provider or pharmacist before using salt substitutes. °Do not fry foods. Cook foods using healthy methods such as baking, boiling, grilling, roasting, and broiling instead. °Cook with heart-healthy oils, such as olive, canola, avocado, soybean, or sunflower oil. °Meal planning ° °Eat a balanced diet that includes: °4 or more servings of fruits and 4 or more servings of vegetables each day. Try to fill one-half of your plate with fruits and vegetables. °6-8 servings of whole grains each day. °Less than 6 oz (170 g) of lean meat, poultry, or fish each day. A 3-oz (85-g) serving of meat is about the same size as a deck of cards. One egg equals 1 oz (28 g). °2-3 servings of low-fat dairy each day. One serving is 1 cup (237 mL). °1 serving of nuts, seeds, or beans 5 times each week. °2-3 servings of heart-healthy fats. Healthy fats called omega-3 fatty acids are found in foods such as walnuts, flaxseeds, fortified milks, and eggs. These fats are also found in cold-water fish, such as sardines, salmon,  and mackerel. °Limit how much you eat of: °Canned or prepackaged foods. °Food that is high in trans fat, such as some fried foods. °Food that is high in saturated fat, such as fatty meat. °Desserts and other sweets, sugary drinks, and other foods with added sugar. °Full-fat dairy products. °Do not salt foods before eating. °Do not eat more than 4 egg yolks a week. °Try to eat at least 2 vegetarian meals a week. °Eat more home-cooked food and less restaurant, buffet, and fast food. °Lifestyle °When eating at a restaurant, ask that your food be prepared with less salt or no salt, if possible. °If you drink alcohol: °Limit how much you use to: °0-1 drink a day for women who are not pregnant. °0-2 drinks a day for men. °Be aware of how much alcohol is in your drink. In the [place] one drink equals one 12 oz bottle of beer (355 mL), one 5 oz glass of wine (148 mL), or one 1½ oz glass of hard liquor (44 mL). °General information °Avoid eating more than 2,300 mg of salt a day. If you have hypertension, you may need to reduce your sodium intake to 1,500 mg a day. °Work with your health care provider to maintain a healthy body weight or to lose weight. Ask what an ideal weight is for you. °Get at least 30 minutes of exercise that causes your heart to beat faster (aerobic exercise) most days of the week. Activities may include walking, swimming, or biking. °Work with your health care provider or dietitian to   adjust your eating plan to your individual calorie needs. °What foods should I eat? °Fruits °All fresh, dried, or frozen fruit. Canned fruit in natural juice (without added sugar). °Vegetables °Fresh or frozen vegetables (raw, steamed, roasted, or grilled). Low-sodium or reduced-sodium tomato and vegetable juice. Low-sodium or reduced-sodium tomato sauce and tomato paste. Low-sodium or reduced-sodium canned vegetables. °Grains °Whole-grain or whole-wheat bread. Whole-grain or whole-wheat pasta. Brown rice. Oatmeal. Quinoa.  Bulgur. Whole-grain and low-sodium cereals. Pita bread. Low-fat, low-sodium crackers. Whole-wheat flour tortillas. °Meats and other proteins °Skinless chicken or turkey. Ground chicken or turkey. Pork with fat trimmed off. Fish and seafood. Egg whites. Dried beans, peas, or lentils. Unsalted nuts, nut butters, and seeds. Unsalted canned beans. Lean cuts of beef with fat trimmed off. Low-sodium, lean precooked or cured meat, such as sausages or meat loaves. °Dairy °Low-fat (1%) or fat-free (skim) milk. Reduced-fat, low-fat, or fat-free cheeses. Nonfat, low-sodium ricotta or cottage cheese. Low-fat or nonfat yogurt. Low-fat, low-sodium cheese. °Fats and oils °Soft margarine without trans fats. Vegetable oil. Reduced-fat, low-fat, or light mayonnaise and salad dressings (reduced-sodium). Canola, safflower, olive, avocado, soybean, and sunflower oils. Avocado. °Seasonings and condiments °Herbs. Spices. Seasoning mixes without salt. °Other foods °Unsalted popcorn and pretzels. Fat-free sweets. °The items listed above may not be a complete list of foods and beverages you can eat. Contact a dietitian for more information. °What foods should I avoid? °Fruits °Canned fruit in a light or heavy syrup. Fried fruit. Fruit in cream or butter sauce. °Vegetables °Creamed or fried vegetables. Vegetables in a cheese sauce. Regular canned vegetables (not low-sodium or reduced-sodium). Regular canned tomato sauce and paste (not low-sodium or reduced-sodium). Regular tomato and vegetable juice (not low-sodium or reduced-sodium). Pickles. Olives. °Grains °Baked goods made with fat, such as croissants, muffins, or some breads. Dry pasta or rice meal packs. °Meats and other proteins °Fatty cuts of meat. Ribs. Fried meat. Bacon. Bologna, salami, and other precooked or cured meats, such as sausages or meat loaves. Fat from the back of a pig (fatback). Bratwurst. Salted nuts and seeds. Canned beans with added salt. Canned or smoked fish.  Whole eggs or egg yolks. Chicken or turkey with skin. °Dairy °Whole or 2% milk, cream, and half-and-half. Whole or full-fat cream cheese. Whole-fat or sweetened yogurt. Full-fat cheese. Nondairy creamers. Whipped toppings. Processed cheese and cheese spreads. °Fats and oils °Butter. Stick margarine. Lard. Shortening. Ghee. Bacon fat. Tropical oils, such as coconut, palm kernel, or palm oil. °Seasonings and condiments °Onion salt, garlic salt, seasoned salt, table salt, and sea salt. Worcestershire sauce. Tartar sauce. Barbecue sauce. Teriyaki sauce. Soy sauce, including reduced-sodium. Steak sauce. Canned and packaged gravies. Fish sauce. Oyster sauce. Cocktail sauce. Store-bought horseradish. Ketchup. Mustard. Meat flavorings and tenderizers. Bouillon cubes. Hot sauces. Pre-made or packaged marinades. Pre-made or packaged taco seasonings. Relishes. Regular salad dressings. °Other foods °Salted popcorn and pretzels. °The items listed above may not be a complete list of foods and beverages you should avoid. Contact a dietitian for more information. °Where to find more information °National Heart, Lung, and Blood Institute: www.nhlbi.nih.gov °American Heart Association: www.heart.org °Academy of Nutrition and Dietetics: www.eatright.org °National Kidney Foundation: www.kidney.org °Summary °The DASH eating plan is a healthy eating plan that has been shown to reduce high blood pressure (hypertension). It may also reduce your risk for type 2 diabetes, heart disease, and stroke. °When on the DASH eating plan, aim to eat more fresh fruits and vegetables, whole grains, lean proteins, low-fat dairy, and heart-healthy fats. °With the DASH   eating plan, you should limit salt (sodium) intake to 2,300 mg a day. If you have hypertension, you may need to reduce your sodium intake to 1,500 mg a day. °Work with your health care provider or dietitian to adjust your eating plan to your individual calorie needs. °This information is not  intended to replace advice given to you by your health care provider. Make sure you discuss any questions you have with your health care provider. °Document Revised: 08/09/2019 Document Reviewed: 08/09/2019 °Elsevier Patient Education © 2022 [employer] ° °

## 2021-11-29 NOTE — Progress Notes (Signed)
Subjective:   By signing my name below, I, Rebekah Valentine, attest that this documentation has been prepared under the direction and in the presence of Rebekah Alar, NP 11/29/2021        Patient ID: Rebekah Valentine, female    DOB: 01-May-1946, 76 y.o.   MRN: [id number]  No chief complaint on file.   HPI Patient is in today for a 6 month f/u  Hypertension- GERD-  Past Medical History:  Diagnosis Date   Arthritis    Breast cyst    recurrent   COLONIC POLYPS, HX OF 11/2007   GERD (gastroesophageal reflux disease)    HEMORRHOIDS    HYPERTENSION    Hypothyroidism    INSOMNIA    Knee pain    Arthritis    SKIN CANCER, HX OF    TROCHANTERIC BURSITIS, RIGHT     Past Surgical History:  Procedure Laterality Date   BASAL CELL CARCINOMA EXCISION     face   colon polpectomy  11/2007   Dr. Fuller Plan   COLONOSCOPY     HYSTEROSCOPY     polyp   Lumpectomy Right 1990   breast   [place] Right 02/22/2016   Procedure: RIGHT TOTAL KNEE ARTHROPLASTY;  Surgeon: Vickey Huger, MD;  Location: [place];  Service: Orthopedics;  Laterality: Right;    Family History  Problem Relation Age of Onset   Arthritis Father    Coronary artery disease Father    Hypertension Father    Heart disease Father    COPD Sister        smoker   Blindness Sister        since birth   Congestive Heart Failure Sister    COPD Mother        smoker   Colon cancer Sister 77   Breast cancer Paternal Aunt    Diabetes Paternal Grandmother    Colon polyps Neg Hx    Esophageal cancer Neg Hx    Rectal cancer Neg Hx    Stomach cancer Neg Hx     Social History   Socioeconomic History   Marital status: Widowed    Spouse name: Not on file   Number of children: Not on file   Years of education: Not on file   Highest education level: Not on file  Occupational History   Not on file  Tobacco Use   Smoking status: Never   Smokeless tobacco: Never   Tobacco  comments:    Married, lives with spouse. Retired asst. 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   Reads/walks dog   Social Determinants of Health   Financial Resource Strain: Low Risk    Difficulty of Paying Living Expenses: Not hard at all  Food Insecurity: No Food Insecurity   Worried About Charity fundraiser in the Last Year: Never true   Ran Out of Food in the Last Year: Never true  Transportation Needs:  No Transportation Needs   Lack of Transportation (Medical): No   Lack of Transportation (Non-Medical): No  Physical Activity: Insufficiently Active   Days of Exercise per Week: 7 days   Minutes of Exercise per Session: 20 min  Stress: No Stress Concern Present   Feeling of Stress : Not at all  Social Connections: Moderately Integrated   Frequency of Communication with Friends and Family: More than three times a week   Frequency of Social Gatherings with Friends and Family: More than three times a week   Attends Religious Services: More than 4 times per year   Active Member of [employer] or Organizations: Yes   Attends Archivist Meetings: More than 4 times per year   Marital Status: Widowed  Human resources officer Violence: Not At Risk   Fear of Current or Ex-Partner: No   Emotionally Abused: No   Physically Abused: No   Sexually Abused: No    Outpatient Medications Prior to Visit  Medication Sig Dispense Refill   amitriptyline (ELAVIL) 25 MG tablet TAKE 1 TABLET(25 MG) BY MOUTH AT BEDTIME 90 tablet 1   amLODipine (NORVASC) 5 MG tablet TAKE 1 TABLET(5 MG) BY MOUTH DAILY 90 tablet  1   aspirin EC 81 MG tablet Take 81 mg by mouth 3 (three) times a week.      Biotin 5 MG CAPS Take by mouth.     Calcium Carbonate-Vit D-Min (CALCIUM 1200 PO) Take 1 tablet by mouth every morning. '600mg'$  calcium and '500mg'$  vitamin D each tablet     Cholecalciferol (VITAMIN D3) 2000 UNITS TABS Take 2,000 Units by mouth daily.      cycloSPORINE (RESTASIS) 0.05 % ophthalmic emulsion Place 1 drop into both eyes every 12 (twelve) hours.     levothyroxine (SYNTHROID) 50 MCG tablet TAKE 1 TABLET(50 MCG) BY MOUTH DAILY BEFORE BREAKFAST 90 tablet 1   Multiple Vitamins-Minerals (ONE-A-DAY EXTRAS ANTIOXIDANT) CAPS Take 1 capsule by mouth daily.     omeprazole (PRILOSEC) 20 MG capsule TAKE 1 CAPSULE(20 MG) BY MOUTH DAILY 90 capsule 3   Turmeric (QC TUMERIC COMPLEX PO) Take by mouth.     Facility-Administered Medications Prior to Visit  Medication Dose Route Frequency Provider Last Rate Last Admin   0.9 %  sodium chloride infusion  500 mL Intravenous Once Ladene Artist, MD        No Known Allergies  Review of Systems  Constitutional:  Negative for fever.  HENT:  Negative for ear pain and hearing loss.        (-)nystagmus (-)adenopathy  Eyes:  Negative for blurred vision.  Respiratory:  Negative for cough, shortness of breath and wheezing.   Cardiovascular:  Negative for chest pain and leg swelling.  Gastrointestinal:  Negative for blood in stool, diarrhea, nausea and vomiting.  Genitourinary:  Negative for dysuria and frequency.  Musculoskeletal:  Negative for joint pain and myalgias.  Skin:  Negative for rash.  Neurological:  Negative for headaches.  Psychiatric/Behavioral:  Negative for depression. The patient is not nervous/anxious.       Objective:    Physical Exam Constitutional:      General: She is not in acute distress.    Appearance: Normal appearance. She is not ill-appearing.  HENT:     Head: Normocephalic and atraumatic.     Right Ear: External ear normal.     Left Ear:  External ear normal.  Eyes:     Extraocular Movements: Extraocular movements intact.     Pupils: Pupils  are equal, round, and reactive to light.  Cardiovascular:     Rate and Rhythm: Normal rate and regular rhythm.     Pulses: Normal pulses.     Heart sounds: Normal heart sounds. No murmur heard. Pulmonary:     Effort: Pulmonary effort is normal. No respiratory distress.     Breath sounds: Normal breath sounds. No wheezing or rhonchi.  Abdominal:     General: Bowel sounds are normal. There is no distension.     Palpations: Abdomen is soft.     Tenderness: There is no abdominal tenderness. There is no guarding or rebound.  Musculoskeletal:     Cervical back: Neck supple.  Lymphadenopathy:     Cervical: No cervical adenopathy.  Skin:    General: Skin is warm and dry.  Neurological:     Mental Status: She is alert and oriented to person, place, and time.  Psychiatric:        Behavior: Behavior normal.        Judgment: Judgment normal.    There were no vitals taken for this visit. Wt Readings from Last 3 Encounters:  07/06/21 183 lb (83 kg)  05/31/21 183 lb (83 kg)  04/22/21 182 lb (82.6 kg)    Diabetic Foot Exam - Simple   No data filed    Lab Results  Component Value Date   WBC 7.3 02/23/2016   HGB 12.1 02/23/2016   HCT 36.9 02/23/2016   PLT 225 02/23/2016   GLUCOSE 96 05/31/2021   CHOL 176 08/13/2018   TRIG 99.0 08/13/2018   HDL 54.80 08/13/2018   LDLCALC 102 (H) 08/13/2018   ALT 16 02/11/2016   AST 19 02/11/2016   NA 141 05/31/2021   K 3.9 05/31/2021   CL 105 05/31/2021   CREATININE 0.81 05/31/2021   BUN 18 05/31/2021   CO2 27 05/31/2021   TSH 1.90 05/31/2021   INR 1.04 02/11/2016    Lab Results  Component Value Date   TSH 1.90 05/31/2021   Lab Results  Component Value Date   WBC 7.3 02/23/2016   HGB 12.1 02/23/2016   HCT 36.9 02/23/2016   MCV 83.7 02/23/2016   PLT 225 02/23/2016   Lab Results  Component Value Date   NA 141 05/31/2021   K  3.9 05/31/2021   CO2 27 05/31/2021   GLUCOSE 96 05/31/2021   BUN 18 05/31/2021   CREATININE 0.81 05/31/2021   BILITOT 0.5 02/11/2016   ALKPHOS 53 02/11/2016   AST 19 02/11/2016   ALT 16 02/11/2016   PROT 6.2 (L) 02/11/2016   ALBUMIN 3.9 02/11/2016   CALCIUM 8.9 05/31/2021   ANIONGAP 7 02/23/2016   GFR 70.40 05/31/2021   Lab Results  Component Value Date   CHOL 176 08/13/2018   Lab Results  Component Value Date   HDL 54.80 08/13/2018   Lab Results  Component Value Date   LDLCALC 102 (H) 08/13/2018   Lab Results  Component Value Date   TRIG 99.0 08/13/2018   Lab Results  Component Value Date   CHOLHDL 3 08/13/2018   No results found for: HGBA1C     Assessment & Plan:   Problem List Items Addressed This Visit   None   No orders of the defined types were placed in this encounter.   I,Rebekah Valentine,acting as a Education administrator for [employer], NP.,have documented all relevant documentation on the behalf of Rebekah Pear, NP,as directed by  Rebekah Pear, NP while in the presence  of Rebekah Pear, NP.   Rebekah Register, NP, personally preformed the services described in this documentation.  All medical record entries made by the scribe were at my direction and in my presence.  I have reviewed the chart and discharge instructions (if applicable) and agree that the record reflects my personal performance and is accurate and complete. 11/29/2021

## 2021-11-29 NOTE — Progress Notes (Signed)
Subjective:   By signing my name below, I, Carylon Perches, attest that this documentation has been prepared under the direction and in the presence of Roma Schanz DO, 11/29/2021     Patient ID: Rebekah Valentine, female    DOB: [birth year], 76 y.o.   MRN: [id number]  Chief Complaint  Patient presents with   Hypertension   Hypothyroidism   Follow-up    HPI Patient is in today for an office visit for f/u bp and thyroid.   No complaints.     Patient requests no refills.   Past Medical History:  Diagnosis Date   Arthritis    Breast cyst    recurrent   COLONIC POLYPS, HX OF 11/2007   GERD (gastroesophageal reflux disease)    HEMORRHOIDS    HYPERTENSION    Hypothyroidism    INSOMNIA    Knee pain    Arthritis    SKIN CANCER, HX OF    TROCHANTERIC BURSITIS, RIGHT     Past Surgical History:  Procedure Laterality Date   BASAL CELL CARCINOMA EXCISION     face   colon polpectomy  11/2007   Dr. Fuller Plan   COLONOSCOPY     HYSTEROSCOPY     polyp   Lumpectomy Right 1990   breast   [place] Right 02/22/2016   Procedure: RIGHT TOTAL KNEE ARTHROPLASTY;  Surgeon: Vickey Huger, MD;  Location: [facility];  Service: Orthopedics;  Laterality: Right;    Family History  Problem Relation Age of Onset   Arthritis Father    Coronary artery disease Father    Hypertension Father    Heart disease Father    COPD Sister        smoker   Blindness Sister        since birth   Congestive Heart Failure Sister    COPD Mother        smoker   Colon cancer Sister 43   Breast cancer Paternal Aunt    Diabetes Paternal Grandmother    Colon polyps Neg Hx    Esophageal cancer Neg Hx    Rectal cancer Neg Hx    Stomach cancer Neg Hx     Social History   Socioeconomic History   Marital status: Widowed    Spouse name: Not on file   Number of children: Not on file   Years of education: Not on file   Highest education level: Not on file   Occupational History   Not on file  Tobacco Use   Smoking status: Never   Smokeless tobacco: Never   Tobacco comments:    Married, lives with spouse. Retired asst. 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   Reads/walks dog   Social Determinants of Health   Financial Resource Strain: Low Risk    Difficulty of Paying Living Expenses: Not hard at all  Food Insecurity: No Food Insecurity   Worried About Running Out of  Food in the Last Year: Never true   [place] in the Last Year: Never true  Transportation Needs: No Transportation Needs   Lack of Transportation (Medical): No   Lack of Transportation (Non-Medical): No  Physical Activity: Insufficiently Active   Days of Exercise per Week: 7 days   Minutes of Exercise per Session: 20 min  Stress: No Stress Concern Present   Feeling of Stress : Not at all  Social Connections: Moderately Integrated   Frequency of Communication with Friends and Family: More than three times a week   Frequency of Social Gatherings with Friends and Family: More than three times a week   Attends Religious Services: More than 4 times per year   Active Member of [employer] or Organizations: Yes   Attends Archivist Meetings: More than 4 times per year   Marital Status: Widowed  Human resources officer Violence: Not At Risk   Fear of Current or Ex-Partner: No   Emotionally Abused: No   Physically Abused: No   Sexually Abused: No    Outpatient Medications Prior to Visit  Medication Sig Dispense Refill   amitriptyline (ELAVIL) 25 MG tablet TAKE 1 TABLET(25  MG) BY MOUTH AT BEDTIME 90 tablet 1   amLODipine (NORVASC) 5 MG tablet TAKE 1 TABLET(5 MG) BY MOUTH DAILY 90 tablet 1   aspirin EC 81 MG tablet Take 81 mg by mouth 3 (three) times a week.      Biotin 5 MG CAPS Take by mouth.     Calcium Carbonate-Vit D-Min (CALCIUM 1200 PO) Take 1 tablet by mouth every morning. '600mg'$  calcium and '500mg'$  vitamin D each tablet     Cholecalciferol (VITAMIN D3) 2000 UNITS TABS Take 2,000 Units by mouth daily.      cycloSPORINE (RESTASIS) 0.05 % ophthalmic emulsion Place 1 drop into both eyes every 12 (twelve) hours.     levothyroxine (SYNTHROID) 50 MCG tablet TAKE 1 TABLET(50 MCG) BY MOUTH DAILY BEFORE BREAKFAST 90 tablet 1   Multiple Vitamins-Minerals (ONE-A-DAY EXTRAS ANTIOXIDANT) CAPS Take 1 capsule by mouth daily.     omeprazole (PRILOSEC) 20 MG capsule TAKE 1 CAPSULE(20 MG) BY MOUTH DAILY 90 capsule 3   Turmeric (QC TUMERIC COMPLEX PO) Take by mouth.     Facility-Administered Medications Prior to Visit  Medication Dose Route Frequency Provider Last Rate Last Admin   0.9 %  sodium chloride infusion  500 mL Intravenous Once Ladene Artist, MD        No Known Allergies  Review of Systems  Constitutional:  Negative for chills, fever and malaise/fatigue.  HENT:  Negative for congestion and hearing loss.   Eyes:  Negative for discharge.  Respiratory:  Negative for cough, sputum production and shortness of breath.   Cardiovascular:  Negative for chest pain, palpitations and leg swelling.  Gastrointestinal:  Negative for abdominal pain, blood in stool, constipation, diarrhea, heartburn, nausea and vomiting.  Genitourinary:  Negative for dysuria, frequency, hematuria and urgency.  Musculoskeletal:  Negative for back pain, falls and myalgias.  Skin:  Negative for rash.  Neurological:  Negative for dizziness, sensory change, loss of consciousness, weakness and headaches.  Endo/Heme/Allergies:  Negative for environmental allergies. Does not bruise/bleed easily.   Psychiatric/Behavioral:  Negative for depression and suicidal ideas. The patient is not nervous/anxious and does not have insomnia.       Objective:    Physical Exam Vitals and nursing note reviewed.  Constitutional:      General: She is not in  acute distress.    Appearance: Normal appearance. She is not ill-appearing.  HENT:     Head: Normocephalic and atraumatic.     Right Ear: External ear normal.     Left Ear: External ear normal.  Eyes:     Extraocular Movements: Extraocular movements intact.     Pupils: Pupils are equal, round, and reactive to light.  Cardiovascular:     Rate and Rhythm: Normal rate and regular rhythm.     Heart sounds: Normal heart sounds. No murmur heard.   No gallop.  Pulmonary:     Effort: Pulmonary effort is normal. No respiratory distress.     Breath sounds: Normal breath sounds. No wheezing or rales.  Skin:    General: Skin is warm and dry.  Neurological:     Mental Status: She is alert and oriented to person, place, and time.  Psychiatric:        Judgment: Judgment normal.    BP 126/78 (BP Location: Left Arm, Patient Position: Sitting, Cuff Size: Large)    Pulse 85    Temp 98.1 F (36.7 C) (Oral)    Resp 18    Ht '5\' 4"'$  (1.626 m)    Wt 174 lb 3.2 oz (79 kg)    SpO2 97%    BMI 29.90 kg/m  Wt Readings from Last 3 Encounters:  11/29/21 174 lb 3.2 oz (79 kg)  07/06/21 183 lb (83 kg)  05/31/21 183 lb (83 kg)    Diabetic Foot Exam - Simple   No data filed    Lab Results  Component Value Date   WBC 7.3 02/23/2016   HGB 12.1 02/23/2016   HCT 36.9 02/23/2016   PLT 225 02/23/2016   GLUCOSE 96 05/31/2021   CHOL 176 08/13/2018   TRIG 99.0 08/13/2018   HDL 54.80 08/13/2018   LDLCALC 102 (H) 08/13/2018   ALT 16 02/11/2016   AST 19 02/11/2016   NA 141 05/31/2021   K 3.9 05/31/2021   CL 105 05/31/2021   CREATININE 0.81 05/31/2021   BUN 18 05/31/2021   CO2 27 05/31/2021   TSH 1.90 05/31/2021   INR 1.04 02/11/2016    Lab Results   Component Value Date   TSH 1.90 05/31/2021   Lab Results  Component Value Date   WBC 7.3 02/23/2016   HGB 12.1 02/23/2016   HCT 36.9 02/23/2016   MCV 83.7 02/23/2016   PLT 225 02/23/2016   Lab Results  Component Value Date   NA 141 05/31/2021   K 3.9 05/31/2021   CO2 27 05/31/2021   GLUCOSE 96 05/31/2021   BUN 18 05/31/2021   CREATININE 0.81 05/31/2021   BILITOT 0.5 02/11/2016   ALKPHOS 53 02/11/2016   AST 19 02/11/2016   ALT 16 02/11/2016   PROT 6.2 (L) 02/11/2016   ALBUMIN 3.9 02/11/2016   CALCIUM 8.9 05/31/2021   ANIONGAP 7 02/23/2016   GFR 70.40 05/31/2021   Lab Results  Component Value Date   CHOL 176 08/13/2018   Lab Results  Component Value Date   HDL 54.80 08/13/2018   Lab Results  Component Value Date   LDLCALC 102 (H) 08/13/2018   Lab Results  Component Value Date   TRIG 99.0 08/13/2018   Lab Results  Component Value Date   CHOLHDL 3 08/13/2018   No results found for: HGBA1C     Assessment & Plan:   Problem List Items Addressed This Visit       Unprioritized   HTN (  hypertension)    Well controlled, no changes to meds. Encouraged heart healthy diet such as the DASH diet and exercise as tolerated.       Relevant Orders   Lipid panel   TSH   Comprehensive metabolic panel   Hypothyroidism - Primary    Check labs Con't synthroid  F/u 6 months for cpe      Relevant Orders   Lipid panel   TSH   Comprehensive metabolic panel      No orders of the defined types were placed in this encounter.   IAnn Held, DO, personally preformed the services described in this documentation.  All medical record entries made by the scribe were at my direction and in my presence.  I have reviewed the chart and discharge instructions (if applicable) and agree that the record reflects my personal performance and is accurate and complete. 11/29/2021   I,Amber Collins,acting as a scribe for [employer], DO.,have documented all  relevant documentation on the behalf of Ann Held, DO,as directed by  Ann Held, DO while in the presence of Ann Held, DO.    Ann Held, DO

## 2022-01-23 ENCOUNTER — Other Ambulatory Visit: Payer: Self-pay | Admitting: Family

## 2022-02-28 DIAGNOSIS — N3000 Acute cystitis without hematuria: Secondary | ICD-10-CM | POA: Diagnosis not present

## 2022-04-14 DIAGNOSIS — C44311 Basal cell carcinoma of skin of nose: Secondary | ICD-10-CM | POA: Diagnosis not present

## 2022-04-14 DIAGNOSIS — L821 Other seborrheic keratosis: Secondary | ICD-10-CM | POA: Diagnosis not present

## 2022-04-14 DIAGNOSIS — D485 Neoplasm of uncertain behavior of skin: Secondary | ICD-10-CM | POA: Diagnosis not present

## 2022-04-14 DIAGNOSIS — Z85828 Personal history of other malignant neoplasm of skin: Secondary | ICD-10-CM | POA: Diagnosis not present

## 2022-04-14 DIAGNOSIS — D225 Melanocytic nevi of trunk: Secondary | ICD-10-CM | POA: Diagnosis not present

## 2022-04-23 ENCOUNTER — Other Ambulatory Visit: Payer: Self-pay | Admitting: Family

## 2022-05-02 ENCOUNTER — Ambulatory Visit (INDEPENDENT_AMBULATORY_CARE_PROVIDER_SITE_OTHER): Payer: Medicare PPO

## 2022-05-02 VITALS — Ht 64.0 in | Wt 174.0 lb

## 2022-05-02 DIAGNOSIS — Z0000 Encounter for general adult medical examination without abnormal findings: Secondary | ICD-10-CM

## 2022-05-02 NOTE — Progress Notes (Signed)
Subjective:   Rebekah Valentine is a 76 y.o. female who presents for Medicare Annual (Subsequent) preventive examination.  I connected with Jacayla today by telephone and verified that I am speaking with the correct person using two identifiers. Location patient: home Location provider: work Persons participating in the virtual visit: patient, Marine scientist.    I discussed the limitations, risks, security and privacy concerns of performing an evaluation and management service by telephone and the availability of in person appointments. I also discussed with the patient that there may be a patient responsible charge related to this service. The patient expressed understanding and verbally consented to this telephonic visit.    Interactive audio and video telecommunications were attempted between this provider and patient, however failed, due to patient having technical difficulties OR patient did not have access to video capability.  We continued and completed visit with audio only.  Some vital signs may be absent or patient reported.   Time Spent with patient on telephone encounter: 20 minutes   Review of Systems     Cardiac Risk Factors include: advanced age (>65mn, >>58women);hypertension     Objective:    Today's Vitals   05/02/22 0931  Weight: 174 lb (78.9 kg)  Height: '5\' 4"'$  (1.626 m)   Body mass index is 29.87 kg/m.     05/02/2022    9:34 AM 04/22/2021    8:25 AM 08/29/2019   10:56 AM 08/13/2018    8:21 AM 02/11/2016    8:22 AM 01/15/2016    7:45 AM 12/15/2015    3:43 PM  Advanced Directives  Does Patient Have a Medical Advance Directive? Yes Yes Yes Yes Yes Yes Yes  Type of AParamedicof ANorth StarLiving will HTidiouteLiving will Living will HLanettLiving will HLehighLiving will Living will HMorrillLiving will  Does patient want to make changes to medical advance  directive?   No - Patient declined   No - Patient declined No - Patient declined  Copy of HClydein Chart? Yes - validated most recent copy scanned in chart (See row information) Yes - validated most recent copy scanned in chart (See row information)  Yes - validated most recent copy scanned in chart (See row information)  Yes No - copy requested    Current Medications (verified) Outpatient Encounter Medications as of 05/02/2022  Medication Sig   amitriptyline (ELAVIL) 25 MG tablet TAKE 1 TABLET(25 MG) BY MOUTH AT BEDTIME   amLODipine (NORVASC) 5 MG tablet TAKE 1 TABLET(5 MG) BY MOUTH DAILY   aspirin EC 81 MG tablet Take 81 mg by mouth 3 (three) times a week.    Biotin 5 MG CAPS Take by mouth.   Calcium Carbonate-Vit D-Min (CALCIUM 1200 PO) Take 1 tablet by mouth every morning. '600mg'$  calcium and '500mg'$  vitamin D each tablet   Cholecalciferol (VITAMIN D3) 2000 UNITS TABS Take 2,000 Units by mouth daily.    cycloSPORINE (RESTASIS) 0.05 % ophthalmic emulsion Place 1 drop into both eyes every 12 (twelve) hours.   levothyroxine (SYNTHROID) 50 MCG tablet TAKE 1 TABLET(50 MCG) BY MOUTH DAILY BEFORE AND BREAKFAST   Multiple Vitamins-Minerals (ONE-A-DAY EXTRAS ANTIOXIDANT) CAPS Take 1 capsule by mouth daily.   omeprazole (PRILOSEC) 20 MG capsule TAKE 1 CAPSULE(20 MG) BY MOUTH DAILY   Turmeric (QC TUMERIC COMPLEX PO) Take by mouth.   Facility-Administered Encounter Medications as of 05/02/2022  Medication   0.9 %  sodium chloride infusion    Allergies (verified) Patient has no known allergies.   History: Past Medical History:  Diagnosis Date   Arthritis    Breast cyst    recurrent   COLONIC POLYPS, HX OF 11/2007   GERD (gastroesophageal reflux disease)    HEMORRHOIDS    HYPERTENSION    Hypothyroidism    INSOMNIA    Knee pain    Arthritis    SKIN CANCER, HX OF    TROCHANTERIC BURSITIS, RIGHT    Past Surgical History:  Procedure Laterality Date   BASAL CELL  CARCINOMA EXCISION     face   colon polpectomy  11/2007   Dr. Fuller Plan   COLONOSCOPY     HYSTEROSCOPY     polyp   Lumpectomy Right 1990   breast   [place] Right 02/22/2016   Procedure: RIGHT TOTAL KNEE ARTHROPLASTY;  Surgeon: Vickey Huger, MD;  Location: [facility];  Service: Orthopedics;  Laterality: Right;   Family History  Problem Relation Age of Onset   Arthritis Father    Coronary artery disease Father    Hypertension Father    Heart disease Father    COPD Sister        smoker   Blindness Sister        since birth   Congestive Heart Failure Sister    COPD Mother        smoker   Colon cancer Sister 25   Breast cancer Paternal Aunt    Diabetes Paternal Grandmother    Colon polyps Neg Hx    Esophageal cancer Neg Hx    Rectal cancer Neg Hx    Stomach cancer Neg Hx    Social History   Socioeconomic History   Marital status: Widowed    Spouse name: Not on file   Number of children: Not on file   Years of education: Not on file   Highest education level: Not on file  Occupational History   Not on file  Tobacco Use   Smoking status: Never   Smokeless tobacco: Never   Tobacco comments:    Married, lives with spouse. Retired asst. 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   Reads/walks dog   Social Determinants of Health   Financial Resource Strain: Low Risk  (05/02/2022)   Overall Financial Resource Strain (CARDIA)    Difficulty of Paying Living Expenses: Not hard at all  Food Insecurity: No Food Insecurity  (05/02/2022)   Hunger Vital Sign    Worried About Running Out of Food in the Last Year: Never true    Ran Out of Food in the Last Year: Never true  Transportation Needs: No Transportation Needs (05/02/2022)   PRAPARE - Hydrologist (Medical): No    Lack of Transportation (Non-Medical): No  Physical Activity: Inactive (05/02/2022)   Exercise Vital Sign    Days of Exercise per Week: 0  days    Minutes of Exercise per Session: 0 min  Stress: No Stress Concern Present (05/02/2022)   [place]    Feeling of Stress : Not at all  Social Connections: Moderately Integrated (05/02/2022)   Social Connection and Isolation Panel [NHANES]    Frequency of Communication with Friends and Family: More than three times a week    Frequency of Social Gatherings with Friends and Family: More than three times a week    Attends Religious Services: More than 4 times per year    Active Member of [employer] or Organizations: Yes    Attends Archivist Meetings: More than 4 times per year    Marital Status: Widowed    Tobacco Counseling Counseling given: Not Answered Tobacco comments: Married, lives with spouse. Retired asst. teacher-subs now K-5   Clinical Intake:  Pre-visit preparation completed: Yes        BMI - recorded: 29.87 Nutritional Status: BMI 25 -29 Overweight Nutritional Risks: None Diabetes: No  How often do you need to have someone help you when you read instructions, pamphlets, or other written materials from your doctor or pharmacy?: 1 - Never  Diabetic?No  Interpreter Needed?: No  Information entered by :: Caroleen Hamman LPN   Activities of Daily Living    05/02/2022    9:39 AM  In your present state of health, do you have any difficulty performing the following activities:  Hearing? 0  Vision? 0  Difficulty concentrating or making decisions? 0  Walking or climbing stairs? 0   Dressing or bathing? 0  Doing errands, shopping? 0  Preparing Food and eating ? N  Using the Toilet? N  In the past six months, have you accidently leaked urine? N  Do you have problems with loss of bowel control? N  Managing your Medications? N  Managing your Finances? N  Housekeeping or managing your Housekeeping? N    Patient Care Team: [name], NP as PCP - General (Internal Medicine) Ladene Artist, MD as Consulting Physician (Gastroenterology) Danella Sensing, MD as Consulting Physician (Dermatology)  Indicate any recent Medical Services you may have received from other than [facility] providers in the past year (date may be approximate).     Assessment:   This is a routine wellness examination for Alvis.  Hearing/Vision screen Hearing Screening - Comments:: No issues Vision Screening - Comments:: Last eye exam-08/2021-Dr. Gershon Crane  Dietary issues and exercise activities discussed: Current Exercise Habits: The patient does not participate in regular exercise at present, Exercise limited by: None identified   Goals Addressed               This Visit's Progress     Patient Stated     Lose weight (pt-stated)   Not on track     Lose 20 lbs.   Portion control and exercise.        Other     Patient Stated   On track     Maintain current health.       Depression Screen    05/02/2022    9:39 AM 04/22/2021    8:27 AM 11/27/2020    9:45 AM 08/29/2019   11:02 AM 08/13/2018    8:21 AM 09/05/2017    7:59 AM 07/27/2016    8:00 AM  PHQ 2/9 Scores  PHQ - 2 Score 0 0 0 0 0 1 0    Fall Risk    05/02/2022  9:36 AM 04/22/2021    8:26 AM 11/27/2020    9:45 AM 08/29/2019   11:02 AM 08/13/2018    8:21 AM  Fall Risk   Falls in the past year? 0 0 1 1 0  Number falls in past yr: 0 0 0    Injury with Fall? 0 0 0    Follow up Falls prevention discussed Falls prevention discussed  Education provided;Falls prevention discussed     FALL RISK PREVENTION PERTAINING  TO THE HOME:  Any stairs in or around the home? No  Home free of loose throw rugs in walkways, pet beds, electrical cords, etc? Yes  Adequate lighting in your home to reduce risk of falls? Yes   ASSISTIVE DEVICES UTILIZED TO PREVENT FALLS:  Life alert? No  Use of a cane, walker or w/c? No  Grab bars in the bathroom? No  Shower chair or bench in shower? Yes  Elevated toilet seat or a handicapped toilet? No   TIMED UP AND GO:  Was the test performed? No . Phone visit   Cognitive Function:Normal cognitive status assessed by this Nurse Health Advisor. No abnormalities found.      12/15/2015    3:59 PM  MMSE - Mini Mental State Exam  Orientation to time 5  Orientation to Place 5  Registration 3  Attention/ Calculation 5  Recall 3  Language- name 2 objects 2  Language- repeat 1  Language- follow 3 step command 3  Language- read & follow direction 1  Write a sentence 1  Copy design 1  Total score 30        Immunizations Immunization History  Administered Date(s) Administered   Fluad Quad(high Dose 65+) 05/21/2019, 06/01/2020, 05/31/2021   Influenza Split 07/10/2012, 06/19/2014   Influenza Whole 10/19/2009, 07/06/2010   Influenza, High Dose Seasonal PF 06/19/2013, 07/27/2016, 05/30/2017   Influenza-Unspecified 06/19/2014, 06/02/2015, 07/31/2018   PFIZER(Purple Top)SARS-COV-2 Vaccination 10/13/2019, 11/04/2019, 07/04/2020   Pneumococcal Conjugate-13 10/13/2014   Pneumococcal Polysaccharide-23 12/30/2008, 09/05/2017   Td 03/02/2002, 03/01/2021   Tdap 03/21/2011   Zoster Recombinat (Shingrix) 11/20/2017, 01/23/2018   Zoster, Live 10/25/2007    TDAP status: Up to date  Flu Vaccine status: Up to date  Pneumococcal vaccine status: Up to date  Covid-19 vaccine status: Information provided on how to obtain vaccines.   Qualifies for Shingles Vaccine? No   Zostavax completed Yes   Shingrix Completed?: Yes  Screening Tests Health Maintenance  Topic Date Due    COVID-19 Vaccine (4 - Pfizer series) 08/29/2020   INFLUENZA VACCINE  04/19/2022   TETANUS/TDAP  03/02/2031   Pneumonia Vaccine 9+ Years old  Completed   DEXA SCAN  Completed   Hepatitis C Screening  Completed   Zoster Vaccines- Shingrix  Completed   HPV VACCINES  Aged Out   COLONOSCOPY (Pts 45-21yr Insurance coverage will need to be confirmed)  DStratfordMaintenance Due  Topic Date Due   COVID-19 Vaccine (4 - Pfizer series) 08/29/2020   INFLUENZA VACCINE  04/19/2022    Colorectal cancer screening: No longer required.   Mammogram status: Declined  Bone Density status: Declined  Lung Cancer Screening: (Low Dose CT Chest recommended if Age 77-80years, 30 pack-year currently smoking OR have quit w/in 15years.) does not qualify.     Additional Screening:  Hepatitis C Screening: Completed 12/15/2015  Vision Screening: Recommended annual ophthalmology exams for early detection of glaucoma and other disorders of the eye. Is the patient up to  date with their annual eye exam?  Yes  Who is the provider or what is the name of the office in which the patient attends annual eye exams? Dr. Gershon Crane   Dental Screening: Recommended annual dental exams for proper oral hygiene  Community Resource Referral / Chronic Care Management: CRR required this visit?  No   CCM required this visit?  No      Plan:     I have personally reviewed and noted the following in the patient's chart:   Medical and social history Use of alcohol, tobacco or illicit drugs  Current medications and supplements including opioid prescriptions.  Functional ability and status Nutritional status Physical activity Advanced directives List of other physicians Hospitalizations, surgeries, and ER visits in previous 12 months Vitals Screenings to include cognitive, depression, and falls Referrals and appointments  In addition, I have reviewed and discussed with patient  certain preventive protocols, quality metrics, and best practice recommendations. A written personalized care plan for preventive services as well as general preventive health recommendations were provided to patient.   Due to this being a telephonic visit, the after visit summary with patients personalized plan was offered to patient via mail or my-chart. Patient to access on my-chart.  Marta Antu, LPN   6/80/3212  Nurse Health Advisor  Nurse Notes: None

## 2022-05-02 NOTE — Patient Instructions (Signed)
Rebekah Valentine , Thank you for taking time to complete your Medicare Wellness Visit. I appreciate your ongoing commitment to your health goals. Please review the following plan we discussed and let me know if I can assist you in the future.   Screening recommendations/referrals: Colonoscopy: No longer required Mammogram: Declined Bone Density: Declined Recommended yearly ophthalmology/optometry visit for glaucoma screening and checkup Recommended yearly dental visit for hygiene and checkup  Vaccinations: Influenza vaccine: Up to date Pneumococcal vaccine: Up to date Tdap vaccine: Up to date Shingles vaccine: Completed vaccines   Covid-19:Declined booster  Advanced directives: Copy in chart  Conditions/risks identified: See problem list  Next appointment: Follow up in one year for your annual wellness visit    Preventive Care 33 Years and Older, Female Preventive care refers to lifestyle choices and visits with your health care provider that can promote health and wellness. What does preventive care include? A yearly physical exam. This is also called an annual well check. Dental exams once or twice a year. Routine eye exams. Ask your health care provider how often you should have your eyes checked. Personal lifestyle choices, including: Daily care of your teeth and gums. Regular physical activity. Eating a healthy diet. Avoiding tobacco and drug use. Limiting alcohol use. Practicing safe sex. Taking low-dose aspirin every day. Taking vitamin and mineral supplements as recommended by your health care provider. What happens during an annual well check? The services and screenings done by your health care provider during your annual well check will depend on your age, overall health, lifestyle risk factors, and family history of disease. Counseling  Your health care provider may ask you questions about your: Alcohol use. Tobacco use. Drug use. Emotional well-being. Home and  relationship well-being. Sexual activity. Eating habits. History of falls. Memory and ability to understand (cognition). Work and work Statistician. Reproductive health. Screening  You may have the following tests or measurements: Height, weight, and BMI. Blood pressure. Lipid and cholesterol levels. These may be checked every 5 years, or more frequently if you are over 11 years old. Skin check. Lung cancer screening. You may have this screening every year starting at age 37 if you have a 30-pack-year history of smoking and currently smoke or have quit within the past 15 years. Fecal occult blood test (FOBT) of the stool. You may have this test every year starting at age 56. Flexible sigmoidoscopy or colonoscopy. You may have a sigmoidoscopy every 5 years or a colonoscopy every 10 years starting at age 4. Hepatitis C blood test. Hepatitis B blood test. Sexually transmitted disease (STD) testing. Diabetes screening. This is done by checking your blood sugar (glucose) after you have not eaten for a while (fasting). You may have this done every 1-3 years. Bone density scan. This is done to screen for osteoporosis. You may have this done starting at age 61. Mammogram. This may be done every 1-2 years. Talk to your health care provider about how often you should have regular mammograms. Talk with your health care provider about your test results, treatment options, and if necessary, the need for more tests. Vaccines  Your health care provider may recommend certain vaccines, such as: Influenza vaccine. This is recommended every year. Tetanus, diphtheria, and acellular pertussis (Tdap, Td) vaccine. You may need a Td booster every 10 years. Zoster vaccine. You may need this after age 75. Pneumococcal 13-valent conjugate (PCV13) vaccine. One dose is recommended after age 75. Pneumococcal polysaccharide (PPSV23) vaccine. One dose is recommended after age  17. Talk to your health care provider  about which screenings and vaccines you need and how often you need them. This information is not intended to replace advice given to you by your health care provider. Make sure you discuss any questions you have with your health care provider. Document Released: 10/02/2015 Document Revised: 05/25/2016 Document Reviewed: 07/07/2015 Elsevier Interactive Patient Education  2017 [place] Prevention in the Home Falls can cause injuries. They can happen to people of all ages. There are many things you can do to make your home safe and to help prevent falls. What can I do on the outside of my home? Regularly fix the edges of walkways and driveways and fix any cracks. Remove anything that might make you trip as you walk through a door, such as a raised step or threshold. Trim any bushes or trees on the path to your home. Use bright outdoor lighting. Clear any walking paths of anything that might make someone trip, such as rocks or tools. Regularly check to see if handrails are loose or broken. Make sure that both sides of any steps have handrails. Any raised decks and porches should have guardrails on the edges. Have any leaves, snow, or ice cleared regularly. Use sand or salt on walking paths during winter. Clean up any spills in your garage right away. This includes oil or grease spills. What can I do in the bathroom? Use night lights. Install grab bars by the toilet and in the tub and shower. Do not use towel bars as grab bars. Use non-skid mats or decals in the tub or shower. If you need to sit down in the shower, use a plastic, non-slip stool. Keep the floor dry. Clean up any water that spills on the floor as soon as it happens. Remove soap buildup in the tub or shower regularly. Attach bath mats securely with double-sided non-slip rug tape. Do not have throw rugs and other things on the floor that can make you trip. What can I do in the bedroom? Use night lights. Make sure  that you have a light by your bed that is easy to reach. Do not use any sheets or blankets that are too big for your bed. They should not hang down onto the floor. Have a firm chair that has side arms. You can use this for support while you get dressed. Do not have throw rugs and other things on the floor that can make you trip. What can I do in the kitchen? Clean up any spills right away. Avoid walking on wet floors. Keep items that you use a lot in easy-to-reach places. If you need to reach something above you, use a strong step stool that has a grab bar. Keep electrical cords out of the way. Do not use floor polish or wax that makes floors slippery. If you must use wax, use non-skid floor wax. Do not have throw rugs and other things on the floor that can make you trip. What can I do with my stairs? Do not leave any items on the stairs. Make sure that there are handrails on both sides of the stairs and use them. Fix handrails that are broken or loose. Make sure that handrails are as long as the stairways. Check any carpeting to make sure that it is firmly attached to the stairs. Fix any carpet that is loose or worn. Avoid having throw rugs at the top or bottom of the stairs. If you do have  throw rugs, attach them to the floor with carpet tape. Make sure that you have a light switch at the top of the stairs and the bottom of the stairs. If you do not have them, ask someone to add them for you. What else can I do to help prevent falls? Wear shoes that: Do not have high heels. Have rubber bottoms. Are comfortable and fit you well. Are closed at the toe. Do not wear sandals. If you use a stepladder: Make sure that it is fully opened. Do not climb a closed stepladder. Make sure that both sides of the stepladder are locked into place. Ask someone to hold it for you, if possible. Clearly mark and make sure that you can see: Any grab bars or handrails. First and last steps. Where the edge of  each step is. Use tools that help you move around (mobility aids) if they are needed. These include: Canes. Walkers. Scooters. Crutches. Turn on the lights when you go into a dark area. Replace any light bulbs as soon as they burn out. Set up your furniture so you have a clear path. Avoid moving your furniture around. If any of your floors are uneven, fix them. If there are any pets around you, be aware of where they are. Review your medicines with your doctor. Some medicines can make you feel dizzy. This can increase your chance of falling. Ask your doctor what other things that you can do to help prevent falls. This information is not intended to replace advice given to you by your health care provider. Make sure you discuss any questions you have with your health care provider. Document Released: 07/02/2009 Document Revised: 02/11/2016 Document Reviewed: 10/10/2014 Elsevier Interactive Patient Education  2017 [employer].

## 2022-05-17 DIAGNOSIS — Z85828 Personal history of other malignant neoplasm of skin: Secondary | ICD-10-CM | POA: Diagnosis not present

## 2022-05-17 DIAGNOSIS — C44311 Basal cell carcinoma of skin of nose: Secondary | ICD-10-CM | POA: Diagnosis not present

## 2022-06-01 ENCOUNTER — Telehealth: Payer: Self-pay | Admitting: Family

## 2022-06-01 ENCOUNTER — Ambulatory Visit (INDEPENDENT_AMBULATORY_CARE_PROVIDER_SITE_OTHER): Payer: Medicare PPO | Admitting: Family

## 2022-06-01 VITALS — BP 122/78 | HR 83 | Resp 18 | Ht 64.0 in | Wt 179.0 lb

## 2022-06-01 DIAGNOSIS — Z0000 Encounter for general adult medical examination without abnormal findings: Secondary | ICD-10-CM | POA: Diagnosis not present

## 2022-06-01 DIAGNOSIS — K219 Gastro-esophageal reflux disease without esophagitis: Secondary | ICD-10-CM | POA: Diagnosis not present

## 2022-06-01 DIAGNOSIS — Z86010 Personal history of colonic polyps: Secondary | ICD-10-CM | POA: Diagnosis not present

## 2022-06-01 DIAGNOSIS — Z23 Encounter for immunization: Secondary | ICD-10-CM

## 2022-06-01 DIAGNOSIS — I10 Essential (primary) hypertension: Secondary | ICD-10-CM

## 2022-06-01 DIAGNOSIS — E039 Hypothyroidism, unspecified: Secondary | ICD-10-CM

## 2022-06-01 DIAGNOSIS — Z85828 Personal history of other malignant neoplasm of skin: Secondary | ICD-10-CM | POA: Diagnosis not present

## 2022-06-01 LAB — BASIC METABOLIC PANEL
BUN: 19 mg/dL (ref 6–23)
CO2: 28 mEq/L (ref 19–32)
Calcium: 9 mg/dL (ref 8.4–10.5)
Chloride: 103 mEq/L (ref 96–112)
Creatinine, Ser: 0.77 mg/dL (ref 0.40–1.20)
GFR: 74.29 mL/min (ref >60.00)
Glucose, Bld: 86 mg/dL (ref 70–99)
Potassium: 3.9 mEq/L (ref 3.5–5.1)
Sodium: 141 mEq/L (ref 135–145)

## 2022-06-01 LAB — TSH: TSH: 2.3 u[IU]/mL (ref 0.35–5.50)

## 2022-06-01 MED ORDER — AMLODIPINE BESYLATE 5 MG PO TABS: ORAL_TABLET | ORAL | 1 refills | Status: DC

## 2022-06-01 NOTE — Assessment & Plan Note (Addendum)
Lab Results  Component Value Date   TSH 2.25 11/29/2021   Clinically stable on synthroid 50 mcg. Continue same. Obtain follow up TSH.

## 2022-06-01 NOTE — Assessment & Plan Note (Signed)
Declines further colonoscopies.

## 2022-06-01 NOTE — Telephone Encounter (Signed)
Please call [facility] and request records from most recent skin biopsy.

## 2022-06-01 NOTE — Addendum Note (Signed)
Addended by: Sanda Linger on: 06/01/2022 08:50 AM   Modules accepted: Orders

## 2022-06-01 NOTE — Assessment & Plan Note (Signed)
Continue healthy diet, exercise. Declines mammo/colo. High dose flu shot today. recomme

## 2022-06-01 NOTE — Assessment & Plan Note (Signed)
Will request most recent skin biopsy records from dermatology.

## 2022-06-01 NOTE — Telephone Encounter (Signed)
Records release faxed 

## 2022-06-01 NOTE — Progress Notes (Signed)
Subjective:   By signing my name below, I, Rebekah Valentine, attest that this documentation has been prepared under the direction and in the presence of Rebekah Alar, NP 06/01/2022    Patient ID: Rebekah Valentine, female    DOB: 1945/10/05, 76 y.o.   MRN: [id number]  Chief Complaint  Patient presents with   Annual Exam    Doing well , no complaints    HPI Patient is in today for a comprehensive physical exam/follow up.   Thyroid- She continues taking 50 mcg synthroid daily PO. Lab Results  Component Value Date   TSH 2.25 11/29/2021   Blood pressure- Her blood pressure is doing well during this visit. She continues taking 5 mg amlodipine daily PO and reports no new issues while taking it.  BP Readings from Last 3 Encounters:  06/01/22 122/78  11/29/21 126/78  07/06/21 (!) 148/70   Pulse Readings from Last 3 Encounters:  06/01/22 83  11/29/21 85  07/06/21 87   Reflux- She continues taking 20 mg Prilosec daily PO and reports no new issues while taking it.   Social history: She had a procedure to remove skin cancer on her nose on August 2023, otherwise she has no new surgical procedures to report.  She denies unexpected weight gain, adenopathy, Fever, New moles, congestion, sinus pain, sore throat, visual disturbance, chest pain, palpitaitons, leg swelling, shortness of breath, wheezing, cough, Nausea, vomiting, diarrhea, consitpation, blood in stool, dysuria, frequency, hematuria, new joint pain, new muscle pain, frequent headaches, dizziness, depression, or anxiety.   Immunizations: She is UTD on tetanus vaccine.   Diet: She is managing a healthy diet at this time.   Exercise: She participates in exercise by volunteering doing yard clean up around her church.   Colonoscopy: Last completed 05/05/2020. Results showed: - Perianal skin tags found on perianal exam. - Mild diverticulosis in the left colon. - Internal hemorrhoids. - The examination was otherwise normal on  direct and retroflexion views. - No specimens collected. No repeat colonoscopy due to age.   Mammogram: She is no longer receiving mammograms.   Dental: She is UTD on dental care.   Vision: She is UTD on vision care.    Health Maintenance Due  Topic Date Due   COVID-19 Vaccine (4 - Pfizer series) 08/29/2020   INFLUENZA VACCINE  04/19/2022    Past Medical History:  Diagnosis Date   Arthritis    Breast cyst    recurrent   COLONIC POLYPS, HX OF 11/2007   GERD (gastroesophageal reflux disease)    HEMORRHOIDS    HYPERTENSION    Hypothyroidism    INSOMNIA    Knee pain    Arthritis    SKIN CANCER, HX OF    TROCHANTERIC BURSITIS, RIGHT     Past Surgical History:  Procedure Laterality Date   BASAL CELL CARCINOMA EXCISION     face   colon polpectomy  11/2007   Dr. Fuller Plan   COLONOSCOPY     HYSTEROSCOPY     polyp   Lumpectomy Right 1990   breast   [place] Right 02/22/2016   Procedure: RIGHT TOTAL KNEE ARTHROPLASTY;  Surgeon: Vickey Huger, MD;  Location: [place];  Service: Orthopedics;  Laterality: Right;    Family History  Problem Relation Age of Onset   Arthritis Father    Coronary artery disease Father    Hypertension Father    Heart disease Father  COPD Sister        smoker   Blindness Sister        since birth   Congestive Heart Failure Sister    COPD Mother        smoker   Colon cancer Sister 39   Breast cancer Paternal Aunt    Diabetes Paternal Grandmother    Colon polyps Neg Hx    Esophageal cancer Neg Hx    Rectal cancer Neg Hx    Stomach cancer Neg Hx     Social History   Socioeconomic History   Marital status: Widowed    Spouse name: Not on file   Number of children: Not on file   Years of education: Not on file   Highest education level: Not on file  Occupational History   Not on file  Tobacco Use   Smoking status: Never   Smokeless tobacco: Never   Tobacco comments:    Married, lives  with spouse. Retired asst. 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   Reads/walks dog   Social Determinants of Health   Financial Resource Strain: Low Risk  (05/02/2022)   Overall Financial Resource Strain (CARDIA)    Difficulty of Paying Living Expenses: Not hard at all  Food Insecurity: No Food Insecurity (05/02/2022)   Hunger Vital Sign    Worried About Running Out of Food in the Last Year: Never true    Ran Out of Food in the Last Year: Never true  Transportation Needs: No Transportation Needs (05/02/2022)   PRAPARE - Hydrologist (Medical): No    Lack of Transportation (Non-Medical): No  Physical Activity: Inactive (05/02/2022)   Exercise Vital Sign    Days of Exercise per Week: 0 days    Minutes of Exercise per Session: 0 min  Stress: No Stress Concern Present (05/02/2022)   [place]    Feeling of Stress : Not at all  Social Connections: Moderately Integrated (05/02/2022)   Social Connection and Isolation Panel [NHANES]    Frequency of Communication with Friends and Family: More than three times a week    Frequency of Social Gatherings with Friends and Family: More than three times a week    Attends Religious Services: More than 4 times per year    Active Member of [employer] or Organizations: Yes    Attends Archivist Meetings: More than 4 times per year    Marital Status: Widowed  Intimate Partner Violence: Not At Risk (05/02/2022)   Humiliation, Afraid, Rape, and Kick  questionnaire    Fear of Current or Ex-Partner: No    Emotionally Abused: No    Physically Abused: No    Sexually Abused: No    Outpatient Medications Prior to Visit  Medication Sig Dispense Refill   amitriptyline (ELAVIL) 25 MG tablet TAKE 1 TABLET(25 MG) BY MOUTH AT BEDTIME 90 tablet 1   aspirin EC 81 MG tablet Take  81 mg by mouth 3 (three) times a week.      Biotin 5 MG CAPS Take by mouth.     Calcium Carbonate-Vit D-Min (CALCIUM 1200 PO) Take 1 tablet by mouth every morning. '600mg'$  calcium and '500mg'$  vitamin D each tablet     Cholecalciferol (VITAMIN D3) 2000 UNITS TABS Take 2,000 Units by mouth daily.      cycloSPORINE (RESTASIS) 0.05 % ophthalmic emulsion Place 1 drop into both eyes every 12 (twelve) hours.     levothyroxine (SYNTHROID) 50 MCG tablet TAKE 1 TABLET(50 MCG) BY MOUTH DAILY BEFORE AND BREAKFAST 90 tablet 1   Multiple Vitamins-Minerals (ONE-A-DAY EXTRAS ANTIOXIDANT) CAPS Take 1 capsule by mouth daily.     omeprazole (PRILOSEC) 20 MG capsule TAKE 1 CAPSULE(20 MG) BY MOUTH DAILY 90 capsule 3   Turmeric (QC TUMERIC COMPLEX PO) Take by mouth.     amLODipine (NORVASC) 5 MG tablet TAKE 1 TABLET(5 MG) BY MOUTH DAILY 90 tablet 1   Facility-Administered Medications Prior to Visit  Medication Dose Route Frequency Provider Last Rate Last Admin   0.9 %  sodium chloride infusion  500 mL Intravenous Once Ladene Artist, MD        No Known Allergies  Review of Systems  Constitutional:  Negative for fever.       (-)unexpected weight change (-)Adenopathy  HENT:  Negative for congestion, sinus pain and sore throat.   Eyes:        (-)Visual disturbance  Respiratory:  Negative for cough, shortness of breath and wheezing.   Cardiovascular:  Negative for chest pain, palpitations and leg swelling.  Gastrointestinal:  Negative for blood in stool, constipation, diarrhea, nausea and vomiting.  Genitourinary:  Negative for dysuria, frequency and hematuria.  Musculoskeletal:         (-)new muscle pain (-)new joint pain  Skin:        (-)new moles  Neurological:  Negative for dizziness and headaches.  Psychiatric/Behavioral:  Negative for depression. The patient is not nervous/anxious.        Objective:    Physical Exam Constitutional:      General: She is not in acute distress.    Appearance: Normal appearance. She is not ill-appearing.  HENT:     Head: Normocephalic and atraumatic.     Right Ear: Tympanic membrane, ear canal and external ear normal.     Left Ear: Tympanic membrane, ear canal and external ear normal.     Mouth/Throat:     Pharynx: Oropharynx is clear. No oropharyngeal exudate or posterior oropharyngeal erythema.  Eyes:     Extraocular Movements: Extraocular movements intact.     Right eye: No nystagmus.     Left eye: No nystagmus.     Pupils: Pupils are equal, round, and reactive to light.  Neck:     Thyroid: No thyroid tenderness.  Cardiovascular:     Rate and Rhythm: Normal rate and regular rhythm.     Heart sounds: Normal heart sounds. No murmur heard.    No gallop.  Pulmonary:     Effort: Pulmonary effort is normal. No respiratory distress.     Breath sounds: Normal breath sounds. No wheezing or rales.  Abdominal:     General: There is no distension.     Palpations: Abdomen is soft.     Tenderness: There is no abdominal tenderness. There is no guarding.  Musculoskeletal:     Comments: 5/5 strength in both upper and lower extremities  Lymphadenopathy:  Cervical: No cervical adenopathy.  Skin:    General: Skin is warm and dry.  Neurological:     Mental Status: She is alert and oriented to person, place, and time.     Deep Tendon Reflexes:     Reflex Scores:      Patellar reflexes are 2+ on the right side and 2+ on the left side. Psychiatric:        Judgment: Judgment normal.     BP 122/78   Pulse 83   Resp 18   Ht '5\' 4"'$  (1.626 m)   Wt 179 lb (81.2 kg)   SpO2 97%   BMI 30.73 kg/m  Wt Readings from Last 3  Encounters:  06/01/22 179 lb (81.2 kg)  05/02/22 174 lb (78.9 kg)  11/29/21 174 lb 3.2 oz (79 kg)       Assessment & Plan:   Problem List Items Addressed This Visit       Unprioritized   SKIN CANCER, HX OF    Will request most recent skin biopsy records from dermatology.       Preventative health care - Primary    Continue healthy diet, exercise. Declines mammo/colo. High dose flu shot today. recomme      Hypothyroidism    Lab Results  Component Value Date   TSH 2.25 11/29/2021  Clinically stable on synthroid 50 mcg. Continue same. Obtain follow up TSH.       Relevant Orders   TSH   HTN (hypertension)    BP Readings from Last 3 Encounters:  06/01/22 122/78  11/29/21 126/78  07/06/21 (!) 148/70  BP stable.  Continue amlodipine 5 mg.       Relevant Medications   amLODipine (NORVASC) 5 MG tablet   Other Relevant Orders   Basic metabolic panel   GERD (gastroesophageal reflux disease)    Stable on omeprazole '20mg'$ .       COLONIC POLYPS, HX OF    Declines further colonoscopies.         Meds ordered this encounter  Medications   amLODipine (NORVASC) 5 MG tablet    Sig: TAKE 1 TABLET(5 MG) BY MOUTH DAILY    Dispense:  90 tablet    Refill:  1    Order Specific Question:   Supervising Provider    Answer:   Penni Homans A [[id number]]    I, [name], NP, personally preformed the services described in this documentation.  All medical record entries made by the scribe were at my direction and in my presence.  I have reviewed the chart and discharge instructions (if applicable) and agree that the record reflects my personal performance and is accurate and complete. 06/01/2022   I,Rebekah Valentine,acting as a Education administrator for [name], NP.,have documented all relevant documentation on the behalf of [name], NP,as directed by  [name], NP while in the presence of [name], NP.   [name], NP

## 2022-06-01 NOTE — Assessment & Plan Note (Signed)
Stable on omeprazole '20mg'$ .

## 2022-06-01 NOTE — Assessment & Plan Note (Addendum)
BP Readings from Last 3 Encounters:  06/01/22 122/78  11/29/21 126/78  07/06/21 (!) 148/70   BP stable.  Continue amlodipine 5 mg.

## 2022-06-09 ENCOUNTER — Encounter: Payer: Self-pay | Admitting: Family

## 2022-06-25 DIAGNOSIS — N390 Urinary tract infection, site not specified: Secondary | ICD-10-CM | POA: Diagnosis not present

## 2022-09-20 DIAGNOSIS — H52203 Unspecified astigmatism, bilateral: Secondary | ICD-10-CM | POA: Diagnosis not present

## 2022-09-20 DIAGNOSIS — H04123 Dry eye syndrome of bilateral lacrimal glands: Secondary | ICD-10-CM | POA: Diagnosis not present

## 2022-09-20 DIAGNOSIS — H4322 Crystalline deposits in vitreous body, left eye: Secondary | ICD-10-CM | POA: Diagnosis not present

## 2022-09-20 DIAGNOSIS — H524 Presbyopia: Secondary | ICD-10-CM | POA: Diagnosis not present

## 2022-09-20 DIAGNOSIS — H43392 Other vitreous opacities, left eye: Secondary | ICD-10-CM | POA: Diagnosis not present

## 2022-09-20 DIAGNOSIS — H25813 Combined forms of age-related cataract, bilateral: Secondary | ICD-10-CM | POA: Diagnosis not present

## 2022-09-20 DIAGNOSIS — H5213 Myopia, bilateral: Secondary | ICD-10-CM | POA: Diagnosis not present

## 2022-10-20 ENCOUNTER — Other Ambulatory Visit: Payer: Self-pay | Admitting: Family

## 2022-11-24 DIAGNOSIS — R300 Dysuria: Secondary | ICD-10-CM | POA: Diagnosis not present

## 2022-11-24 DIAGNOSIS — N390 Urinary tract infection, site not specified: Secondary | ICD-10-CM | POA: Diagnosis not present

## 2022-11-24 DIAGNOSIS — R350 Frequency of micturition: Secondary | ICD-10-CM | POA: Diagnosis not present

## 2022-11-30 ENCOUNTER — Ambulatory Visit: Payer: Medicare PPO | Admitting: Family

## 2022-12-02 ENCOUNTER — Ambulatory Visit: Payer: Medicare PPO | Admitting: Family

## 2022-12-02 VITALS — BP 107/83 | HR 80 | Temp 98.3°F | Resp 16 | Wt 176.0 lb

## 2022-12-02 DIAGNOSIS — I10 Essential (primary) hypertension: Secondary | ICD-10-CM

## 2022-12-02 DIAGNOSIS — K219 Gastro-esophageal reflux disease without esophagitis: Secondary | ICD-10-CM | POA: Diagnosis not present

## 2022-12-02 DIAGNOSIS — E039 Hypothyroidism, unspecified: Secondary | ICD-10-CM | POA: Diagnosis not present

## 2022-12-02 LAB — COMPREHENSIVE METABOLIC PANEL
ALT: 10 U/L (ref 0–35)
AST: 15 U/L (ref 0–37)
Albumin: 3.9 g/dL (ref 3.5–5.2)
Alkaline Phosphatase: 66 U/L (ref 39–117)
BUN: 19 mg/dL (ref 6–23)
CO2: 29 mEq/L (ref 19–32)
Calcium: 8.9 mg/dL (ref 8.4–10.5)
Chloride: 104 mEq/L (ref 96–112)
Creatinine, Ser: 0.85 mg/dL (ref 0.40–1.20)
GFR: 65.75 mL/min (ref >60.00)
Glucose, Bld: 80 mg/dL (ref 70–99)
Potassium: 3.9 mEq/L (ref 3.5–5.1)
Sodium: 142 mEq/L (ref 135–145)
Total Bilirubin: 0.6 mg/dL (ref 0.2–1.2)
Total Protein: 6.1 g/dL (ref 6.0–8.3)

## 2022-12-02 LAB — TSH: TSH: 1.47 u[IU]/mL (ref 0.35–5.50)

## 2022-12-02 NOTE — Assessment & Plan Note (Signed)
At goal on amlodipine 5mg . Home reading much better than here- "white coat" HTN.

## 2022-12-02 NOTE — Assessment & Plan Note (Signed)
Feels well on synthroid 50 mcg.  Continue same.

## 2022-12-02 NOTE — Assessment & Plan Note (Signed)
Overall stable with omeprazole 20mg .

## 2022-12-02 NOTE — Progress Notes (Signed)
Subjective:   By signing my name below, I, Rebekah Valentine, attest that this documentation has been prepared under the direction and in the presence of [name], NP. 12/02/2022.   Patient ID: Rebekah Valentine, female    DOB: 04/21/1946, 77 y.o.   MRN: [id number]  Chief Complaint  Patient presents with   Hypertension    Here for follow    Hypothyroidism    Here for follow up    HPI Patient is in today for an office visit.  Acid reflux:  Normally her acid reflux is well managed on Prilosec. Recently she did have one significant episode of acid reflux that woke her up at 3:30 in the morning. This was associated with emesis. She notes that the night before she had eaten a hot dog with multiple condiments.  Blood pressure:  Her blood pressure was elevated in clinic today at 146/60. However, she presents a BP log which is personally reviewed. Her reading this morning at home was 107/83 on amlodipine 5 mg. BP Readings from Last 3 Encounters:  12/02/22 107/83  06/01/22 122/78  11/29/21 126/78   Exercise:  She is doing well with staying active. Recently she has started water aerobics. 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  Immunizations:  She will plan to receive a RSV vaccination soon. She did not receive a Covid-19 booster vaccination last Fall.  Past Medical History:  Diagnosis Date   Arthritis    Breast cyst    recurrent   COLONIC POLYPS, HX OF 11/18/2007   GERD (gastroesophageal reflux disease)    HEMORRHOIDS    HYPERTENSION    Hypothyroidism    INSOMNIA    Knee pain    Arthritis    SKIN CANCER, HX OF    basal cell carcinoma of the nose   TROCHANTERIC BURSITIS, RIGHT     Past Surgical History:  Procedure Laterality Date   BASAL CELL CARCINOMA EXCISION     face   colon polpectomy  11/2007   Dr. Fuller Plan   COLONOSCOPY     HYSTEROSCOPY     polyp   Lumpectomy Right 1990   breast   [place] Right 02/22/2016   Procedure: RIGHT TOTAL KNEE ARTHROPLASTY;  Surgeon: Vickey Huger, MD;  Location: [place];  Service: Orthopedics;  Laterality: Right;    Family History  Problem Relation Age of Onset   Arthritis Father    Coronary artery disease Father    Hypertension Father    Heart disease Father    COPD Sister        smoker   Blindness Sister        since birth   Congestive Heart Failure Sister    COPD Mother        smoker   Colon cancer Sister 68   Breast cancer Paternal Aunt    Diabetes Paternal Grandmother    Colon polyps Neg Hx    Esophageal cancer Neg Hx    Rectal cancer Neg Hx    Stomach cancer Neg Hx     Social History   Socioeconomic History   Marital status: Widowed    Spouse name: Not on file   Number of children: Not on file   Years of education: Not on file   Highest education level: Not on file  Occupational History   Not on file  Tobacco Use   Smoking status:  Never   Smokeless tobacco: Never   Tobacco comments:    Married, lives with spouse. Retired asst. 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   Reads/walks dog   Social Determinants of Health   Financial Resource Valentine: Low Risk  (05/02/2022)   Overall Financial Resource Valentine (CARDIA)    Difficulty of Paying Living Expenses: Not hard at all  Food Insecurity: No Food Insecurity (05/02/2022)   Hunger Vital Sign    Worried About Running Out of Food in the Last Year: Never true    Ran Out of Food in the Last Year: Never true  Transportation Needs: No Transportation Needs (05/02/2022)    PRAPARE - Hydrologist (Medical): No    Lack of Transportation (Non-Medical): No  Physical Activity: Inactive (05/02/2022)   Exercise Vital Sign    Days of Exercise per Week: 0 days    Minutes of Exercise per Session: 0 min  Stress: No Stress Concern Present (05/02/2022)   [place]    Feeling of Stress : Not at all  Social Connections: Moderately Integrated (05/02/2022)   Social Connection and Isolation Panel [NHANES]    Frequency of Communication with Friends and Family: More than three times a week    Frequency of Social Gatherings with Friends and Family: More than three times a week    Attends Religious Services: More than 4 times per year    Active Member of [employer] or Organizations: Yes    Attends Archivist Meetings: More than 4 times per year    Marital Status: Widowed  Intimate Partner Violence: Not At Risk (05/02/2022)   Humiliation, Afraid, Rape, and Kick questionnaire    Fear of Current or Ex-Partner: No    Emotionally Abused: No    Physically Abused: No    Sexually Abused: No    Outpatient Medications Prior to Visit  Medication Sig Dispense Refill   amitriptyline (ELAVIL) 25 MG tablet TAKE 1 TABLET(25 MG) BY MOUTH AT BEDTIME 90 tablet 1   amLODipine (NORVASC) 5 MG tablet TAKE 1 TABLET(5 MG) BY MOUTH DAILY 90 tablet 1   aspirin EC 81 MG tablet Take 81 mg by mouth 3 (three) times a week.      Calcium Carbonate-Vit D-Min (CALCIUM 1200 PO) Take 1 tablet by mouth every morning. 600mg  calcium and 500mg  vitamin D each tablet     Cholecalciferol (VITAMIN D3) 2000 UNITS TABS Take 2,000 Units by mouth daily.      cycloSPORINE (RESTASIS) 0.05 % ophthalmic emulsion Place 1 drop into both eyes every 12 (twelve) hours.     levothyroxine (SYNTHROID) 50 MCG tablet TAKE ONE TABLET BY MOUTH DAILY BEFORE BREAKFAST. 90 tablet 1   Multiple Vitamins-Minerals (ONE-A-DAY EXTRAS ANTIOXIDANT)  CAPS Take 1 capsule by mouth daily.     omeprazole (PRILOSEC) 20 MG capsule TAKE 1 CAPSULE(20 MG) BY MOUTH DAILY 90 capsule 3   Turmeric (QC TUMERIC COMPLEX PO) Take  by mouth.     Biotin 5 MG CAPS Take by mouth.     Facility-Administered Medications Prior to Visit  Medication Dose Route Frequency Provider Last Rate Last Admin   0.9 %  sodium chloride infusion  500 mL Intravenous Once Ladene Artist, MD        No Known Allergies  ROS See HPI.     Objective:    Physical Exam Constitutional:      Appearance: Normal appearance.  HENT:     Head: Normocephalic and atraumatic.     Right Ear: Tympanic membrane, ear canal and external ear normal.     Left Ear: Tympanic membrane, ear canal and external ear normal.  Eyes:     Extraocular Movements: Extraocular movements intact.     Pupils: Pupils are equal, round, and reactive to light.  Cardiovascular:     Rate and Rhythm: Normal rate and regular rhythm.     Heart sounds: Normal heart sounds. No murmur heard.    No gallop.  Pulmonary:     Effort: Pulmonary effort is normal. No respiratory distress.     Breath sounds: Normal breath sounds. No wheezing or rales.  Skin:    General: Skin is warm and dry.  Neurological:     General: No focal deficit present.     Mental Status: She is alert and oriented to person, place, and time.  Psychiatric:        Mood and Affect: Mood normal.        Behavior: Behavior normal.     BP 107/83   Pulse 80   Temp 98.3 F (36.8 C) (Oral)   Resp 16   Wt 176 lb (79.8 kg)   SpO2 100%   BMI 30.21 kg/m  Wt Readings from Last 3 Encounters:  12/02/22 176 lb (79.8 kg)  06/01/22 179 lb (81.2 kg)  05/02/22 174 lb (78.9 kg)      Assessment & Plan:   Problem List Items Addressed This Visit       Unprioritized   Hypothyroidism    Feels well on synthroid 50 mcg.  Continue same.       Relevant Orders   TSH   HTN (hypertension)    At goal on amlodipine 5mg . Home reading much better than  here- "white coat" HTN.       Relevant Orders   Comp Met (CMET)   GERD (gastroesophageal reflux disease) - Primary    Overall stable with omeprazole 20mg .         No orders of the defined types were placed in this encounter.   I, [name], NP, personally preformed the services described in this documentation.  All medical record entries made by the scribe were at my direction and in my presence.  I have reviewed the chart and discharge instructions (if applicable) and agree that the record reflects my personal performance and is accurate and complete. 12/02/2022.  I,Mathew Stumpf,acting as a Education administrator for [employer], NP.,have documented all relevant documentation on the behalf of [name], NP,as directed by  [name], NP while in the presence of [name], NP.   [name], NP

## 2023-01-23 ENCOUNTER — Other Ambulatory Visit: Payer: Self-pay

## 2023-01-23 MED ORDER — AMLODIPINE BESYLATE 5 MG PO TABS: ORAL_TABLET | ORAL | 1 refills | Status: DC

## 2023-01-30 DIAGNOSIS — R350 Frequency of micturition: Secondary | ICD-10-CM | POA: Diagnosis not present

## 2023-01-30 DIAGNOSIS — N390 Urinary tract infection, site not specified: Secondary | ICD-10-CM | POA: Diagnosis not present

## 2023-01-30 DIAGNOSIS — R300 Dysuria: Secondary | ICD-10-CM | POA: Diagnosis not present

## 2023-02-06 DIAGNOSIS — R21 Rash and other nonspecific skin eruption: Secondary | ICD-10-CM | POA: Diagnosis not present

## 2023-02-06 DIAGNOSIS — W57XXXA Bitten or stung by nonvenomous insect and other nonvenomous arthropods, initial encounter: Secondary | ICD-10-CM | POA: Diagnosis not present

## 2023-03-30 DIAGNOSIS — M1712 Unilateral primary osteoarthritis, left knee: Secondary | ICD-10-CM | POA: Diagnosis not present

## 2023-03-30 DIAGNOSIS — M25562 Pain in left knee: Secondary | ICD-10-CM | POA: Diagnosis not present

## 2023-03-30 DIAGNOSIS — G8929 Other chronic pain: Secondary | ICD-10-CM | POA: Diagnosis not present

## 2023-04-18 ENCOUNTER — Other Ambulatory Visit: Payer: Self-pay | Admitting: Family

## 2023-05-02 ENCOUNTER — Telehealth: Payer: Self-pay | Admitting: Family

## 2023-05-02 NOTE — Telephone Encounter (Signed)
Copied from CRM [id number]. Topic: Medicare AWV >> May 02, 2023 10:25 AM Payton Doughty wrote: Reason for CRM: LM 05/02/2023 to schedule AWV   Verlee Rossetti; Care Guide Ambulatory Clinical Support [place] [facility] Direct Dial: [phone]

## 2023-05-04 ENCOUNTER — Encounter (INDEPENDENT_AMBULATORY_CARE_PROVIDER_SITE_OTHER): Payer: Self-pay

## 2023-05-31 DIAGNOSIS — Z85828 Personal history of other malignant neoplasm of skin: Secondary | ICD-10-CM | POA: Diagnosis not present

## 2023-05-31 DIAGNOSIS — L739 Follicular disorder, unspecified: Secondary | ICD-10-CM | POA: Diagnosis not present

## 2023-05-31 DIAGNOSIS — L738 Other specified follicular disorders: Secondary | ICD-10-CM | POA: Diagnosis not present

## 2023-05-31 DIAGNOSIS — L821 Other seborrheic keratosis: Secondary | ICD-10-CM | POA: Diagnosis not present

## 2023-05-31 DIAGNOSIS — L570 Actinic keratosis: Secondary | ICD-10-CM | POA: Diagnosis not present

## 2023-05-31 DIAGNOSIS — D485 Neoplasm of uncertain behavior of skin: Secondary | ICD-10-CM | POA: Diagnosis not present

## 2023-05-31 DIAGNOSIS — D225 Melanocytic nevi of trunk: Secondary | ICD-10-CM | POA: Diagnosis not present

## 2023-06-05 ENCOUNTER — Ambulatory Visit: Payer: Medicare PPO | Admitting: Family

## 2023-06-05 VITALS — BP 136/67 | HR 86 | Temp 98.2°F | Resp 16 | Ht 64.0 in | Wt 165.8 lb

## 2023-06-05 DIAGNOSIS — I10 Essential (primary) hypertension: Secondary | ICD-10-CM

## 2023-06-05 DIAGNOSIS — E039 Hypothyroidism, unspecified: Secondary | ICD-10-CM | POA: Diagnosis not present

## 2023-06-05 DIAGNOSIS — G4700 Insomnia, unspecified: Secondary | ICD-10-CM

## 2023-06-05 DIAGNOSIS — Z23 Encounter for immunization: Secondary | ICD-10-CM

## 2023-06-05 DIAGNOSIS — K219 Gastro-esophageal reflux disease without esophagitis: Secondary | ICD-10-CM | POA: Diagnosis not present

## 2023-06-05 LAB — BASIC METABOLIC PANEL
BUN: 20 mg/dL (ref 6–23)
CO2: 31 meq/L (ref 19–32)
Calcium: 9.1 mg/dL (ref 8.4–10.5)
Chloride: 104 meq/L (ref 96–112)
Creatinine, Ser: 0.85 mg/dL (ref 0.40–1.20)
GFR: 65.52 mL/min (ref >60.00)
Glucose, Bld: 93 mg/dL (ref 70–99)
Potassium: 4.2 meq/L (ref 3.5–5.1)
Sodium: 144 mEq/L (ref 135–145)

## 2023-06-05 LAB — TSH: TSH: 1.79 u[IU]/mL (ref 0.35–5.50)

## 2023-06-05 MED ORDER — AMLODIPINE BESYLATE 5 MG PO TABS
ORAL_TABLET | ORAL | 1 refills | Status: DC
Start: 2023-06-05 — End: 2024-01-15

## 2023-06-05 MED ORDER — LEVOTHYROXINE SODIUM 50 MCG PO TABS
50.0000 ug | ORAL_TABLET | Freq: Every day | ORAL | 1 refills | Status: DC
Start: 2023-06-05 — End: 2024-01-15

## 2023-06-05 MED ORDER — AMITRIPTYLINE HCL 25 MG PO TABS
25.0000 mg | ORAL_TABLET | Freq: Every day | ORAL | 1 refills | Status: DC
Start: 2023-06-05 — End: 2024-01-15

## 2023-06-05 NOTE — Patient Instructions (Signed)
VISIT SUMMARY:  During your visit today, we discussed your ongoing conditions including hypothyroidism, gastroesophageal reflux disease (GERD), hypertension, and insomnia. We also discussed your general health maintenance. You are doing well on your current medications and there are no changes to your treatment plan at this time.  YOUR PLAN:  -HYPOTHYROIDISM: Hypothyroidism is a condition where your thyroid gland doesn't produce enough thyroid hormones. You are stable on your current dose of Levothyroxine. Continue taking Levothyroxine daily.  -GASTROESOPHAGEAL REFLUX DISEASE (GERD): GERD is a chronic condition where stomach acid frequently flows back into the tube connecting your mouth and stomach (esophagus). You are stable on your current dose of Omeprazole. Continue taking Omeprazole daily.  -HYPERTENSION: Hypertension, or high blood pressure, is a condition where the force of the blood against the artery walls is too high. You are stable on your current dose of Amlodipine. Continue taking Amlodipine 5mg  daily.  -INSOMNIA: Insomnia is a sleep disorder that can make it hard to fall asleep, hard to stay asleep, or cause you to wake up too early and not be able to get back to sleep. You are stable on your current dose of Amitriptyline. Continue taking Amitriptyline as prescribed.  INSTRUCTIONS:  We have taken a blood sample today to check your thyroid hormone levels (TSH). We will contact you with the results. You have received your flu shot today. You have decided against receiving the COVID-19 booster due to previous adverse reactions. Please continue to monitor your health and follow-up in six months.

## 2023-06-05 NOTE — Assessment & Plan Note (Signed)
Stable on daily omeprazole 20 mg.  Continue same.

## 2023-06-05 NOTE — Assessment & Plan Note (Signed)
Stable on amlodipine 5 mg

## 2023-06-05 NOTE — Assessment & Plan Note (Signed)
Clinically stable on current dose of synthroid. Update TSH.

## 2023-06-05 NOTE — Progress Notes (Signed)
Subjective:     Patient ID: Rebekah Valentine, female    DOB: Apr 01, 1946, 77 y.o.   MRN: [id number]  Chief Complaint  Patient presents with   Follow-up    HPI  Discussed the use of AI scribe software for clinical note transcription with the patient, who gave verbal consent to proceed.  History of Present Illness   The patient, with a history of hypothyroidism, presents for a routine follow-up. She is currently on levothyroxine 50mg  daily and report no issues with this medication. Her last thyroid function test was in March and was within normal limits. She would like to receive her flu shot today and has decided against receiving the COVID-19 booster due to previous adverse reactions.  In addition to hypothyroidism, the patient also has a history of reflux, which is well-controlled on daily omeprazole. She reports that if she misses a dose, she notices symptoms the following day. She also has a history of hypertension, which is managed with amlodipine 5mg  daily. Her blood pressure was 128/61 at home this morning and was within normal limits at today's visit.  The patient also takes amitriptyline for sleep and reports no issues with this medication. She recently had a skin lesion removed and is awaiting results.       Lab Results  Component Value Date   TSH 1.47 12/02/2022      Health Maintenance Due  Topic Date Due   INFLUENZA VACCINE  04/20/2023   Medicare Annual Wellness (AWV)  05/03/2023   COVID-19 Vaccine (4 - 2023-24 season) 05/21/2023    Past Medical History:  Diagnosis Date   Arthritis    Breast cyst    recurrent   COLONIC POLYPS, HX OF 11/18/2007   GERD (gastroesophageal reflux disease)    HEMORRHOIDS    HYPERTENSION    Hypothyroidism    INSOMNIA    Knee pain    Arthritis    SKIN CANCER, HX OF    basal cell carcinoma of the nose   TROCHANTERIC BURSITIS, RIGHT     Past Surgical History:  Procedure Laterality Date   BASAL CELL CARCINOMA EXCISION      face   colon polpectomy  11/2007   Dr. Russella Dar   COLONOSCOPY     HYSTEROSCOPY     polyp   Lumpectomy Right 1990   breast   POLYPECTOMY     TONSILLECTOMY  1957   TOTAL KNEE ARTHROPLASTY Right 02/22/2016   Procedure: RIGHT TOTAL KNEE ARTHROPLASTY;  Surgeon: Dannielle Huh, MD;  Location: [facility] OR;  Service: Orthopedics;  Laterality: Right;    Family History  Problem Relation Age of Onset   Arthritis Father    Coronary artery disease Father    Hypertension Father    Heart disease Father    COPD Sister        smoker   Blindness Sister        since birth   Congestive Heart Failure Sister    COPD Mother        smoker   Colon cancer Sister 21   Breast cancer Paternal Aunt    Diabetes Paternal Grandmother    Colon polyps Neg Hx    Esophageal cancer Neg Hx    Rectal cancer Neg Hx    Stomach cancer Neg Hx     Social History   Socioeconomic History   Marital status: Widowed    Spouse name: Not on file   Number of children: Not on file   Years of  education: Not on file   Highest education level: Not on file  Occupational History   Not on file  Tobacco Use   Smoking status: Never   Smokeless tobacco: Never   Tobacco comments:    Married, lives with spouse. Retired asst. 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   Reads/walks dog   Social Determinants of Health   Financial Resource Strain: Low Risk  (05/02/2022)   Overall Financial Resource Strain (CARDIA)    Difficulty of Paying Living Expenses: Not hard at all  Food Insecurity: No Food Insecurity (05/02/2022)   Hunger Vital  Sign    Worried About Running Out of Food in the Last Year: Never true    Ran Out of Food in the Last Year: Never true  Transportation Needs: No Transportation Needs (05/02/2022)   PRAPARE - Administrator, Civil Service (Medical): No    Lack of Transportation (Non-Medical): No  Physical Activity: Inactive (05/02/2022)   Exercise Vital Sign    Days of Exercise per Week: 0 days    Minutes of Exercise per Session: 0 min  Stress: No Stress Concern Present (05/02/2022)   [employer] of Occupational Health - Occupational Stress Questionnaire    Feeling of Stress : Not at all  Social Connections: Moderately Integrated (05/02/2022)   Social Connection and Isolation Panel [NHANES]    Frequency of Communication with Friends and Family: More than three times a week    Frequency of Social Gatherings with Friends and Family: More than three times a week    Attends Religious Services: More than 4 times per year    Active Member of [employer] or Organizations: Yes    Attends Banker Meetings: More than 4 times per year    Marital Status: Widowed  Intimate Partner Violence: Not At Risk (05/02/2022)   Humiliation, Afraid, Rape, and Kick questionnaire    Fear of Current or Ex-Partner: No    Emotionally Abused: No    Physically Abused: No    Sexually Abused: No    Outpatient Medications Prior to Visit  Medication Sig Dispense Refill   aspirin EC 81 MG tablet Take 81 mg by mouth 3 (three) times a week.      Calcium Carbonate-Vit D-Min (CALCIUM 1200 PO) Take 1 tablet by mouth every morning. 600mg  calcium and 500mg  vitamin D each tablet     Cholecalciferol (VITAMIN D3) 2000 UNITS TABS Take 2,000 Units by mouth daily.      cycloSPORINE (RESTASIS) 0.05 % ophthalmic emulsion Place 1 drop into both eyes every 12 (twelve) hours.     Multiple Vitamins-Minerals (ONE-A-DAY EXTRAS ANTIOXIDANT) CAPS Take 1 capsule by mouth daily.     omeprazole (PRILOSEC) 20 MG capsule TAKE 1 CAPSULE(20 MG)  BY MOUTH DAILY 90 capsule 3   Turmeric (QC TUMERIC COMPLEX PO) Take by mouth.     amitriptyline (ELAVIL) 25 MG tablet TAKE 1 TABLET(25 MG) BY MOUTH AT BEDTIME 90 tablet 1   amLODipine (NORVASC)  5 MG tablet TAKE 1 TABLET(5 MG) BY MOUTH DAILY 90 tablet 1   levothyroxine (SYNTHROID) 50 MCG tablet TAKE 1 TABLET BY MOUTH DAILY BEFORE BREAKFAST 90 tablet 1   Facility-Administered Medications Prior to Visit  Medication Dose Route Frequency Provider Last Rate Last Admin   0.9 %  sodium chloride infusion  500 mL Intravenous Once Meryl Dare, MD        No Known Allergies  ROS See HPI    Objective:    Physical Exam Constitutional:      General: She is not in acute distress.    Appearance: Normal appearance. She is well-developed.  HENT:     Head: Normocephalic and atraumatic.     Right Ear: External ear normal.     Left Ear: External ear normal.  Eyes:     General: No scleral icterus. Neck:     Thyroid: No thyromegaly.  Cardiovascular:     Rate and Rhythm: Normal rate and regular rhythm.     Heart sounds: Normal heart sounds. No murmur heard. Pulmonary:     Effort: Pulmonary effort is normal. No respiratory distress.     Breath sounds: Normal breath sounds. No wheezing.  Musculoskeletal:     Cervical back: Neck supple.  Skin:    General: Skin is warm and dry.  Neurological:     Mental Status: She is alert and oriented to person, place, and time.  Psychiatric:        Mood and Affect: Mood normal.        Behavior: Behavior normal.        Thought Content: Thought content normal.        Judgment: Judgment normal.      BP 136/67 (BP Location: Left Arm, Patient Position: Sitting, Cuff Size: Large)   Pulse 86   Temp 98.2 F (36.8 C) (Oral)   Resp 16   Ht 5\' 4"  (1.626 m)   Wt 165 lb 12.8 oz (75.2 kg)   SpO2 98%   BMI 28.46 kg/m  Wt Readings from Last 3 Encounters:  06/05/23 165 lb 12.8 oz (75.2 kg)  12/02/22 176 lb (79.8 kg)  06/01/22 179 lb (81.2 kg)        Assessment & Plan:   Problem List Items Addressed This Visit       Unprioritized   Insomnia    Stable on elavil, continue same.       Relevant Medications   amitriptyline (ELAVIL) 25 MG tablet   Hypothyroidism - Primary    Clinically stable on current dose of synthroid. Update TSH.       Relevant Medications   levothyroxine (SYNTHROID) 50 MCG tablet   Other Relevant Orders   TSH   HTN (hypertension)    Stable on amlodipine 5 mg.       Relevant Medications   amLODipine (NORVASC) 5 MG tablet   Other Relevant Orders   Basic Metabolic Panel (BMET)   GERD (gastroesophageal reflux disease)    Stable on daily omeprazole 20mg . Continue same.        I have changed Ayla J. Sevey's levothyroxine and amitriptyline. I am also having her maintain her Calcium Carbonate-Vit D-Min (CALCIUM 1200 PO), One-A-Day Extras Antioxidant, cycloSPORINE, Vitamin D3, aspirin EC, Turmeric (QC TUMERIC COMPLEX PO), omeprazole, and amLODipine. We will continue to administer sodium chloride.  Meds ordered this encounter  Medications   levothyroxine (SYNTHROID) 50 MCG tablet    Sig: Take 1 tablet (50 mcg total) by mouth daily  before breakfast.    Dispense:  90 tablet    Refill:  1    Order Specific Question:   Supervising Provider    Answer:   Danise Edge A [[id number]]   amLODipine (NORVASC) 5 MG tablet    Sig: TAKE 1 TABLET(5 MG) BY MOUTH DAILY    Dispense:  90 tablet    Refill:  1    Order Specific Question:   Supervising Provider    Answer:   Danise Edge A [[id number]]   amitriptyline (ELAVIL) 25 MG tablet    Sig: Take 1 tablet (25 mg total) by mouth at bedtime.    Dispense:  90 tablet    Refill:  1    Order Specific Question:   Supervising Provider    Answer:   Danise Edge A [[id number]]

## 2023-06-05 NOTE — Assessment & Plan Note (Signed)
Stable on elavil, continue same.

## 2023-06-15 ENCOUNTER — Encounter: Payer: Self-pay | Admitting: Family

## 2023-09-21 DIAGNOSIS — H0100A Unspecified blepharitis right eye, upper and lower eyelids: Secondary | ICD-10-CM | POA: Diagnosis not present

## 2023-09-21 DIAGNOSIS — R300 Dysuria: Secondary | ICD-10-CM | POA: Diagnosis not present

## 2023-09-21 DIAGNOSIS — R350 Frequency of micturition: Secondary | ICD-10-CM | POA: Diagnosis not present

## 2023-09-21 DIAGNOSIS — H25013 Cortical age-related cataract, bilateral: Secondary | ICD-10-CM | POA: Diagnosis not present

## 2023-09-21 DIAGNOSIS — H4322 Crystalline deposits in vitreous body, left eye: Secondary | ICD-10-CM | POA: Diagnosis not present

## 2023-09-21 DIAGNOSIS — H52203 Unspecified astigmatism, bilateral: Secondary | ICD-10-CM | POA: Diagnosis not present

## 2023-09-21 DIAGNOSIS — H0100B Unspecified blepharitis left eye, upper and lower eyelids: Secondary | ICD-10-CM | POA: Diagnosis not present

## 2023-09-21 DIAGNOSIS — H2513 Age-related nuclear cataract, bilateral: Secondary | ICD-10-CM | POA: Diagnosis not present

## 2023-09-21 DIAGNOSIS — H04123 Dry eye syndrome of bilateral lacrimal glands: Secondary | ICD-10-CM | POA: Diagnosis not present

## 2023-09-21 DIAGNOSIS — M545 Low back pain, unspecified: Secondary | ICD-10-CM | POA: Diagnosis not present

## 2023-10-17 ENCOUNTER — Other Ambulatory Visit: Payer: Self-pay | Admitting: Family

## 2023-11-14 DIAGNOSIS — R350 Frequency of micturition: Secondary | ICD-10-CM | POA: Diagnosis not present

## 2023-11-14 DIAGNOSIS — M545 Low back pain, unspecified: Secondary | ICD-10-CM | POA: Diagnosis not present

## 2023-11-14 DIAGNOSIS — N390 Urinary tract infection, site not specified: Secondary | ICD-10-CM | POA: Diagnosis not present

## 2023-11-14 DIAGNOSIS — R300 Dysuria: Secondary | ICD-10-CM | POA: Diagnosis not present

## 2023-11-14 DIAGNOSIS — R3911 Hesitancy of micturition: Secondary | ICD-10-CM | POA: Diagnosis not present

## 2023-12-04 ENCOUNTER — Ambulatory Visit: Payer: Medicare PPO | Admitting: Family

## 2023-12-04 ENCOUNTER — Encounter: Payer: Self-pay | Admitting: Family

## 2023-12-04 VITALS — BP 146/63 | HR 76 | Temp 98.6°F | Resp 16 | Ht 64.0 in | Wt 180.0 lb

## 2023-12-04 DIAGNOSIS — K219 Gastro-esophageal reflux disease without esophagitis: Secondary | ICD-10-CM | POA: Diagnosis not present

## 2023-12-04 DIAGNOSIS — H9319 Tinnitus, unspecified ear: Secondary | ICD-10-CM

## 2023-12-04 DIAGNOSIS — G4700 Insomnia, unspecified: Secondary | ICD-10-CM | POA: Diagnosis not present

## 2023-12-04 DIAGNOSIS — I10 Essential (primary) hypertension: Secondary | ICD-10-CM

## 2023-12-04 DIAGNOSIS — E039 Hypothyroidism, unspecified: Secondary | ICD-10-CM | POA: Diagnosis not present

## 2023-12-04 LAB — BASIC METABOLIC PANEL
BUN: 20 mg/dL (ref 6–23)
CO2: 29 meq/L (ref 19–32)
Calcium: 9.2 mg/dL (ref 8.4–10.5)
Chloride: 104 meq/L (ref 96–112)
Creatinine, Ser: 0.82 mg/dL (ref 0.40–1.20)
GFR: 68.16 mL/min (ref >60.00)
Glucose, Bld: 77 mg/dL (ref 70–99)
Potassium: 4.7 meq/L (ref 3.5–5.1)
Sodium: 141 meq/L (ref 135–145)

## 2023-12-04 LAB — TSH: TSH: 2.29 u[IU]/mL (ref 0.35–5.50)

## 2023-12-04 NOTE — Progress Notes (Signed)
 Subjective:     Patient ID: Rebekah Valentine, female    DOB: 1946-06-16, 78 y.o.   MRN: [id number]  Chief Complaint  Patient presents with   Hypothyroidism    Here for follow up   Hypertension    Here for follow up    Hypertension    Discussed the use of AI scribe software for clinical note transcription with the patient, who gave verbal consent to proceed.  History of Present Illness  Rebekah Valentine is a 78 year old female with hypertension and hypothyroidism who presents for medication follow-up.  She is here for a follow-up on her hypertension medication. Her blood pressure today was 141/62 mmHg, slightly higher than her last visit. She is currently taking amlodipine 5 mg daily for hypertension.  She is on Synthroid 50 mcg (levothyroxine) for hypothyroidism and feels well on this medication.  She takes omeprazole daily for reflux, and her symptoms are well-controlled.  For sleep, she takes amitriptyline at bedtime and her sleep has been good.  She experiences a constant ringing in her ears, described as 'like cicadas or crickets,' but has no concerns about her hearing. This does not interfere with her sleep.     Health Maintenance Due  Topic Date Due   Medicare Annual Wellness (AWV)  05/03/2023   COVID-19 Vaccine (4 - 2024-25 season) 05/21/2023    Past Medical History:  Diagnosis Date   Arthritis    Breast cyst    recurrent   COLONIC POLYPS, HX OF 11/18/2007   GERD (gastroesophageal reflux disease)    HEMORRHOIDS    HYPERTENSION    Hypothyroidism    INSOMNIA    Knee pain    Arthritis    SKIN CANCER, HX OF    basal cell carcinoma of the nose   TROCHANTERIC BURSITIS, RIGHT     Past Surgical History:  Procedure Laterality Date   BASAL CELL CARCINOMA EXCISION     face   colon polpectomy  11/2007   Dr. Russella Dar   COLONOSCOPY     HYSTEROSCOPY     polyp   Lumpectomy Right 1990   breast   POLYPECTOMY     TONSILLECTOMY  1957   TOTAL KNEE ARTHROPLASTY  Right 02/22/2016   Procedure: RIGHT TOTAL KNEE ARTHROPLASTY;  Surgeon: Dannielle Huh, MD;  Location: [facility] OR;  Service: Orthopedics;  Laterality: Right;    Family History  Problem Relation Age of Onset   Arthritis Father    Coronary artery disease Father    Hypertension Father    Heart disease Father    COPD Sister        smoker   Blindness Sister        since birth   Congestive Heart Failure Sister    COPD Mother        smoker   Colon cancer Sister 76   Breast cancer Paternal Aunt    Diabetes Paternal Grandmother    Colon polyps Neg Hx    Esophageal cancer Neg Hx    Rectal cancer Neg Hx    Stomach cancer Neg Hx     Social History   Socioeconomic History   Marital status: Widowed    Spouse name: Not on file   Number of children: Not on file   Years of education: Not on file   Highest education level: Not on file  Occupational History   Not on file  Tobacco Use   Smoking status: Never   Smokeless tobacco: Never  Tobacco comments:    Married, lives with spouse. Retired asst. 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   Reads/walks dog   Social Drivers of Corporate investment banker Strain: Low Risk  (05/02/2022)   Overall Financial Resource Strain (CARDIA)    Difficulty of Paying Living Expenses: Not hard at all  Food Insecurity: No Food Insecurity (05/02/2022)   Hunger Vital Sign    Worried About Running Out of Food in the Last Year: Never true    Ran Out of Food in the Last Year: Never true  Transportation Needs: No Transportation Needs (05/02/2022)   PRAPARE -  Administrator, Civil Service (Medical): No    Lack of Transportation (Non-Medical): No  Physical Activity: Inactive (05/02/2022)   Exercise Vital Sign    Days of Exercise per Week: 0 days    Minutes of Exercise per Session: 0 min  Stress: No Stress Concern Present (05/02/2022)   [employer] of Occupational Health - Occupational Stress Questionnaire    Feeling of Stress : Not at all  Social Connections: Moderately Integrated (05/02/2022)   Social Connection and Isolation Panel [NHANES]    Frequency of Communication with Friends and Family: More than three times a week    Frequency of Social Gatherings with Friends and Family: More than three times a week    Attends Religious Services: More than 4 times per year    Active Member of [employer] or Organizations: Yes    Attends Banker Meetings: More than 4 times per year    Marital Status: Widowed  Intimate Partner Violence: Not At Risk (05/02/2022)   Humiliation, Afraid, Rape, and Kick questionnaire    Fear of Current or Ex-Partner: No    Emotionally Abused: No    Physically Abused: No    Sexually Abused: No    Outpatient Medications Prior to Visit  Medication Sig Dispense Refill   amitriptyline (ELAVIL) 25 MG tablet Take 1 tablet (25 mg total) by mouth at bedtime. 90 tablet 1   amLODipine (NORVASC) 5 MG tablet TAKE 1 TABLET(5 MG) BY MOUTH DAILY 90 tablet 1   aspirin EC 81 MG tablet Take 81 mg by mouth 3 (three) times a week.      Calcium Carbonate-Vit D-Min (CALCIUM 1200 PO) Take 1 tablet by mouth every morning. 600mg  calcium and 500mg  vitamin D each tablet     Cholecalciferol (VITAMIN D3) 2000 UNITS TABS Take 2,000 Units by mouth daily.      cycloSPORINE (RESTASIS) 0.05 % ophthalmic emulsion Place 1 drop into both eyes every 12 (twelve) hours.     levothyroxine (SYNTHROID) 50 MCG tablet Take 1 tablet (50 mcg total) by mouth daily before breakfast. 90 tablet 1   Multiple Vitamins-Minerals (ONE-A-DAY EXTRAS  ANTIOXIDANT) CAPS Take 1 capsule by mouth daily.     omeprazole (PRILOSEC) 20 MG capsule TAKE 1 CAPSULE(20 MG) BY MOUTH DAILY 90 capsule 3   Turmeric (QC TUMERIC COMPLEX PO) Take by mouth.  Facility-Administered Medications Prior to Visit  Medication Dose Route Frequency Provider Last Rate Last Admin   0.9 %  sodium chloride infusion  500 mL Intravenous Once Meryl Dare, MD        No Known Allergies  ROS See HPI    Objective:    Physical Exam Constitutional:      General: She is not in acute distress.    Appearance: Normal appearance. She is well-developed.  HENT:     Head: Normocephalic and atraumatic.     Right Ear: External ear normal.     Left Ear: External ear normal.  Eyes:     General: No scleral icterus. Neck:     Thyroid: No thyromegaly.  Cardiovascular:     Rate and Rhythm: Normal rate and regular rhythm.     Heart sounds: Normal heart sounds. No murmur heard. Pulmonary:     Effort: Pulmonary effort is normal. No respiratory distress.     Breath sounds: Normal breath sounds. No wheezing.  Musculoskeletal:     Cervical back: Neck supple.  Skin:    General: Skin is warm and dry.  Neurological:     Mental Status: She is alert and oriented to person, place, and time.  Psychiatric:        Mood and Affect: Mood normal.        Behavior: Behavior normal.        Thought Content: Thought content normal.        Judgment: Judgment normal.      BP (!) 146/63   Pulse 76   Temp 98.6 F (37 C) (Oral)   Resp 16   Ht 5\' 4"  (1.626 m)   Wt 180 lb (81.6 kg)   SpO2 100%   BMI 30.90 kg/m  Wt Readings from Last 3 Encounters:  12/04/23 180 lb (81.6 kg)  06/05/23 165 lb 12.8 oz (75.2 kg)  12/02/22 176 lb (79.8 kg)       Assessment & Plan:   Problem List Items Addressed This Visit       Unprioritized   Tinnitus   - No current hearing loss concerns. - Advise to inform if any hearing concerns arise for potential referral to an audiologist. - discussed  use of white noise if ringing becomes bothersome.       Insomnia   Reports sleeping well on Elavil, continue same.       Hypothyroidism - Primary   Feeling well on synthroid 50 mcg. Continue same.  Update TSH.       Relevant Orders   TSH   HTN (hypertension)   BP Readings from Last 3 Encounters:  12/04/23 (!) 141/62  06/05/23 136/67  12/02/22 107/83  BP slightly above goal, but given her age will not change medication at this time. Continue amlodipine 5 mg.      Relevant Orders   Basic Metabolic Panel (BMET)   GERD (gastroesophageal reflux disease)   Stable symptoms on daily omeprazole continue same.       I am having Rebekah Valentine maintain her Calcium Carbonate-Vit D-Min (CALCIUM 1200 PO), One-A-Day Extras Antioxidant, cycloSPORINE, Vitamin D3, aspirin EC, Turmeric (QC TUMERIC COMPLEX PO), levothyroxine, amLODipine, amitriptyline, and omeprazole. We will continue to administer sodium chloride.  No orders of the defined types were placed in this encounter.

## 2023-12-04 NOTE — Assessment & Plan Note (Signed)
 Reports sleeping well on Elavil, continue same.

## 2023-12-04 NOTE — Patient Instructions (Signed)
 VISIT SUMMARY:  Today, we reviewed your current medications and overall health. Your blood pressure was slightly higher than last visit, but still acceptable for your age. Your thyroid condition is stable, and your reflux and sleep issues are well-controlled with your current medications. You also mentioned experiencing constant ringing in your ears, but it does not affect your sleep or hearing.  YOUR PLAN:  -HYPERTENSION: Hypertension means high blood pressure. Your blood pressure was slightly above the target but still acceptable for your age. We will continue your current medication, amlodipine 5 mg daily, and recheck your blood pressure in 4 months.  -HYPOTHYROIDISM: Hypothyroidism is when your thyroid gland does not produce enough thyroid hormone. Your condition is stable with your current medication, levothyroxine 50 mcg daily. We will continue this medication and order a thyroid function test to monitor your levels.  -GASTROESOPHAGEAL REFLUX DISEASE (GERD): GERD is a condition where stomach acid frequently flows back into the tube connecting your mouth and stomach. Your reflux symptoms are well-controlled with your current medication, omeprazole, which you should continue taking daily.  -INSOMNIA: Insomnia is difficulty falling or staying asleep. You reported good sleep quality with your current medication, amitriptyline, which you should continue taking at bedtime.  -TINNITUS: Tinnitus is the perception of noise or ringing in the ears. You experience constant ringing but have no concerns about your hearing. Please inform us if you have any hearing concerns in the future for a potential referral to an audiologist.  -GENERAL HEALTH MAINTENANCE: You have an active lifestyle and your kidney function was normal in September. We will order a kidney function test to monitor your health.  INSTRUCTIONS:  Please continue taking your medications as prescribed. We will recheck your blood pressure in  4 months. Additionally, we will order thyroid function and kidney function tests to monitor your health. If you experience any changes in your hearing, please let us know.

## 2023-12-04 NOTE — Assessment & Plan Note (Signed)
 Feeling well on synthroid 50 mcg. Continue same.  Update TSH.

## 2023-12-04 NOTE — Assessment & Plan Note (Signed)
 Stable symptoms on daily omeprazole continue same.

## 2023-12-04 NOTE — Assessment & Plan Note (Signed)
-   No current hearing loss concerns. - Advise to inform if any hearing concerns arise for potential referral to an audiologist. - discussed use of white noise if ringing becomes bothersome.

## 2023-12-04 NOTE — Assessment & Plan Note (Addendum)
 BP Readings from Last 3 Encounters:  12/04/23 (!) 141/62  06/05/23 136/67  12/02/22 107/83  BP slightly above goal, but given her age will not change medication at this time. Continue amlodipine 5 mg.

## 2023-12-25 DIAGNOSIS — N3081 Other cystitis with hematuria: Secondary | ICD-10-CM | POA: Diagnosis not present

## 2023-12-25 DIAGNOSIS — R350 Frequency of micturition: Secondary | ICD-10-CM | POA: Diagnosis not present

## 2023-12-25 DIAGNOSIS — R300 Dysuria: Secondary | ICD-10-CM | POA: Diagnosis not present

## 2024-01-15 ENCOUNTER — Other Ambulatory Visit: Payer: Self-pay | Admitting: Family

## 2024-01-15 DIAGNOSIS — E039 Hypothyroidism, unspecified: Secondary | ICD-10-CM

## 2024-01-15 DIAGNOSIS — G4700 Insomnia, unspecified: Secondary | ICD-10-CM

## 2024-01-15 DIAGNOSIS — I10 Essential (primary) hypertension: Secondary | ICD-10-CM

## 2024-02-08 ENCOUNTER — Telehealth: Payer: Self-pay | Admitting: Family

## 2024-02-08 NOTE — Telephone Encounter (Signed)
 Copied from CRM [id number]. Topic: Medicare AWV >> Feb 08, 2024 10:49 AM Juliana Ocean wrote: Reason for CRM: LVM 02/08/2024 to schedule AWV. Please schedule Virtual or Telehealth visits ONLY  Rosalee Collins; Care Guide Ambulatory Clinical Support [facility] Direct Dial: [phone]

## 2024-03-06 DIAGNOSIS — N3000 Acute cystitis without hematuria: Secondary | ICD-10-CM | POA: Diagnosis not present

## 2024-03-13 DIAGNOSIS — R21 Rash and other nonspecific skin eruption: Secondary | ICD-10-CM | POA: Diagnosis not present

## 2024-04-08 ENCOUNTER — Ambulatory Visit: Admitting: Family Medicine

## 2024-04-11 ENCOUNTER — Encounter: Payer: Self-pay | Admitting: Family

## 2024-04-11 ENCOUNTER — Ambulatory Visit: Admitting: Family

## 2024-04-11 VITALS — BP 136/62 | HR 87 | Temp 98.3°F | Resp 16 | Ht 64.0 in | Wt 177.0 lb

## 2024-04-11 DIAGNOSIS — K219 Gastro-esophageal reflux disease without esophagitis: Secondary | ICD-10-CM

## 2024-04-11 DIAGNOSIS — Z85828 Personal history of other malignant neoplasm of skin: Secondary | ICD-10-CM

## 2024-04-11 DIAGNOSIS — E039 Hypothyroidism, unspecified: Secondary | ICD-10-CM | POA: Diagnosis not present

## 2024-04-11 DIAGNOSIS — I10 Essential (primary) hypertension: Secondary | ICD-10-CM

## 2024-04-11 DIAGNOSIS — G4700 Insomnia, unspecified: Secondary | ICD-10-CM | POA: Diagnosis not present

## 2024-04-11 NOTE — Progress Notes (Signed)
 Subjective:     Patient ID: Rebekah Valentine, female    DOB: 11/06/1945, 78 y.o.   MRN: [id number]  Chief Complaint  Patient presents with   Hypertension    Here for follow up    Hypertension    Discussed the use of AI scribe software for clinical note transcription with the patient, who gave verbal consent to proceed.  History of Present Illness   Rebekah Valentine is a 78 year old female with hypertension who presents for an interim visit.  She recently experienced a spider bite on her left shin after gardening, which led to an urgent care visit. Reports bite is resolved. She also removed a tick from her body recently. Her blood pressure is regularly monitored, with recent readings of 132/71 and 123/87. She takes amlodipine  5 mg daily for hypertension. She maintains an active lifestyle by working in her yard and assisting her 45 year old neighbor with outdoor tasks. She reports good sleep with the aid of amitriptyline . Family history includes a sister with colon cancer, liver cancer, and another sister with Parkinson's disease.    Health Maintenance Due  Topic Date Due   Medicare Annual Wellness (AWV)  05/03/2023   COVID-19 Vaccine (4 - 2024-25 season) 05/21/2023    Past Medical History:  Diagnosis Date   Arthritis    Breast cyst    recurrent   COLONIC POLYPS, HX OF 11/18/2007   GERD (gastroesophageal reflux disease)    HEMORRHOIDS    HYPERTENSION    Hypothyroidism    INSOMNIA    Knee pain    Arthritis    SKIN CANCER, HX OF    basal cell carcinoma of the nose   TROCHANTERIC BURSITIS, RIGHT     Past Surgical History:  Procedure Laterality Date   BASAL CELL CARCINOMA EXCISION     face   colon polpectomy  11/2007   Dr. Aneita   COLONOSCOPY     HYSTEROSCOPY     polyp   Lumpectomy Right 1990   breast   POLYPECTOMY     TONSILLECTOMY  1957   TOTAL KNEE ARTHROPLASTY Right 02/22/2016   Procedure: RIGHT TOTAL KNEE ARTHROPLASTY;  Surgeon: Marcey Raman, MD;   Location: [facility] OR;  Service: Orthopedics;  Laterality: Right;    Family History  Problem Relation Age of Onset   Arthritis Father    Coronary artery disease Father    Hypertension Father    Heart disease Father    COPD Sister        smoker   Blindness Sister        since birth   Congestive Heart Failure Sister    COPD Mother        smoker   Colon cancer Sister 6   Breast cancer Paternal Aunt    Diabetes Paternal Grandmother    Colon polyps Neg Hx    Esophageal cancer Neg Hx    Rectal cancer Neg Hx    Stomach cancer Neg Hx     Social History   Socioeconomic History   Marital status: Widowed    Spouse name: Not on file   Number of children: Not on file   Years of education: Not on file   Highest education level: Not on file  Occupational History   Not on file  Tobacco Use   Smoking status: Never   Smokeless tobacco: Never   Tobacco comments:    Married, lives with spouse. Retired asst. 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   Reads/walks dog   Social Drivers of Corporate investment banker Strain: Low Risk  (05/02/2022)   Overall Financial Resource Strain (CARDIA)    Difficulty of Paying Living Expenses: Not hard at all  Food Insecurity: No Food Insecurity (05/02/2022)   Hunger Vital Sign    Worried About Running Out of Food in the Last Year: Never true    Ran Out of Food in the Last Year: Never true  Transportation Needs: No Transportation Needs (05/02/2022)   PRAPARE - Administrator, Civil Service (Medical): No    Lack of Transportation  (Non-Medical): No  Physical Activity: Inactive (05/02/2022)   Exercise Vital Sign    Days of Exercise per Week: 0 days    Minutes of Exercise per Session: 0 min  Stress: No Stress Concern Present (05/02/2022)   [employer] of Occupational Health - Occupational Stress Questionnaire    Feeling of Stress : Not at all  Social Connections: Moderately Integrated (05/02/2022)   Social Connection and Isolation Panel    Frequency of Communication with Friends and Family: More than three times a week    Frequency of Social Gatherings with Friends and Family: More than three times a week    Attends Religious Services: More than 4 times per year    Active Member of [employer] or Organizations: Yes    Attends Banker Meetings: More than 4 times per year    Marital Status: Widowed  Intimate Partner Violence: Not At Risk (05/02/2022)   Humiliation, Afraid, Rape, and Kick questionnaire    Fear of Current or Ex-Partner: No    Emotionally Abused: No    Physically Abused: No    Sexually Abused: No    Outpatient Medications Prior to Visit  Medication Sig Dispense Refill   amitriptyline  (ELAVIL ) 25 MG tablet TAKE 1 TABLET(25 MG) BY MOUTH AT BEDTIME 90 tablet 1   amLODipine  (NORVASC ) 5 MG tablet TAKE 1 TABLET(5 MG) BY MOUTH DAILY 90 tablet 1   aspirin EC 81 MG tablet Take 81 mg by mouth 3 (three) times a week.      Calcium Carbonate-Vit D-Min (CALCIUM 1200 PO) Take 1 tablet by mouth every morning. 600mg  calcium and 500mg  vitamin D each tablet     Cholecalciferol (VITAMIN D3) 2000 UNITS TABS Take 2,000 Units by mouth daily.      cycloSPORINE  (RESTASIS ) 0.05 % ophthalmic emulsion Place 1 drop into both eyes every 12 (twelve) hours.     levothyroxine  (SYNTHROID ) 50 MCG tablet TAKE 1 TABLET(50 MCG) BY MOUTH DAILY BEFORE BREAKFAST 90 tablet 1   Multiple Vitamins-Minerals (ONE-A-DAY EXTRAS ANTIOXIDANT) CAPS Take 1 capsule by mouth daily.     omeprazole  (PRILOSEC ) 20 MG capsule TAKE 1 CAPSULE(20 MG)  BY MOUTH DAILY 90 capsule 3   Turmeric (QC TUMERIC COMPLEX PO) Take by mouth.     Facility-Administered Medications Prior to Visit  Medication Dose Route Frequency Provider Last Rate Last Admin   0.9 %  sodium chloride  infusion  500 mL Intravenous Once Aneita Gwendlyn DASEN, MD        No Known Allergies  ROS See HPI    Objective:    Physical Exam Constitutional:      General: She is not in acute distress.    Appearance: Normal appearance. She is well-developed.  HENT:     Head: Normocephalic and atraumatic.     Right Ear: External ear normal.     Left Ear: External ear normal.  Eyes:     General: No scleral icterus. Neck:     Thyroid : No thyromegaly.  Cardiovascular:     Rate and Rhythm: Normal rate and regular rhythm.     Heart sounds: Normal heart sounds. No murmur heard. Pulmonary:     Effort: Pulmonary effort is normal. No respiratory distress.     Breath sounds: Normal breath sounds. No wheezing.  Musculoskeletal:     Cervical back: Neck supple.  Skin:    General: Skin is warm and dry.  Neurological:     Mental Status: She is alert and oriented to person, place, and time.  Psychiatric:        Mood and Affect: Mood normal.        Behavior: Behavior normal.        Thought Content: Thought content normal.        Judgment: Judgment normal.      BP 136/62 (BP Location: Right Arm, Patient Position: Sitting, Cuff Size: Normal)   Pulse 87   Temp 98.3 F (36.8 C) (Oral)   Resp 16   Ht 5' 4 (1.626 m)   Wt 177 lb (80.3 kg)   SpO2 100%   BMI 30.38 kg/m  Wt Readings from Last 3 Encounters:  04/11/24 177 lb (80.3 kg)  12/04/23 180 lb (81.6 kg)  06/05/23 165 lb 12.8 oz (75.2 kg)       Assessment & Plan:   Problem List Items Addressed This Visit       Unprioritized   SKIN CANCER, HX OF   Continues to follow with dermatology for annual skin checks.      Insomnia   Sleeping well with elavil .       Hypothyroidism   Lab Results  Component Value Date    TSH 2.29 12/04/2023   Stable on synthroid  50 mcg.        HTN (hypertension) - Primary   Bp stable on amlodipine  5mg .        GERD (gastroesophageal reflux disease)   Stable on omeprazole .         I am having Erionna J. Droll maintain her Calcium Carbonate-Vit D-Min (CALCIUM 1200 PO), One-A-Day Extras Antioxidant, cycloSPORINE , Vitamin D3, aspirin EC, Turmeric (QC TUMERIC COMPLEX PO), omeprazole , amLODipine , levothyroxine , and amitriptyline . We will continue to administer sodium chloride .  No orders of the defined types were placed in this encounter.

## 2024-04-11 NOTE — Assessment & Plan Note (Signed)
 Bp stable on amlodipine  5mg .

## 2024-04-11 NOTE — Patient Instructions (Signed)
 VISIT SUMMARY:  Today, we reviewed your blood pressure management and discussed your recent spider bite and tick removal. Your blood pressure is well-controlled with your current medication. We also talked about your active lifestyle and upcoming dermatology follow-up.  YOUR PLAN:  HYPERTENSION: Your blood pressure is well-controlled with your current medication, amlodipine  5 mg daily. -Continue taking amlodipine  5 mg daily.  GENERAL HEALTH MAINTENANCE: Your kidney and thyroid  functions are normal. You have a dermatology follow-up scheduled for August and you maintain an active lifestyle. -Perform labs at your next visit. -Continue with your annual dermatology follow-ups.  FOLLOW-UP: You had an interim visit today and your prescriptions are sufficient until October. -Schedule your next follow-up visit in six months. -Ensure your prescriptions are refilled as needed until October.

## 2024-04-11 NOTE — Assessment & Plan Note (Signed)
 Sleeping well with elavil.

## 2024-04-11 NOTE — Assessment & Plan Note (Signed)
 Continues to follow with dermatology for annual skin checks.

## 2024-04-11 NOTE — Assessment & Plan Note (Signed)
 Stable on omeprazole.

## 2024-04-11 NOTE — Assessment & Plan Note (Signed)
 Lab Results  Component Value Date   TSH 2.29 12/04/2023   Stable on synthroid  50 mcg.

## 2024-06-10 DIAGNOSIS — D225 Melanocytic nevi of trunk: Secondary | ICD-10-CM | POA: Diagnosis not present

## 2024-06-10 DIAGNOSIS — L814 Other melanin hyperpigmentation: Secondary | ICD-10-CM | POA: Diagnosis not present

## 2024-06-10 DIAGNOSIS — L720 Epidermal cyst: Secondary | ICD-10-CM | POA: Diagnosis not present

## 2024-06-10 DIAGNOSIS — L821 Other seborrheic keratosis: Secondary | ICD-10-CM | POA: Diagnosis not present

## 2024-06-10 DIAGNOSIS — Z85828 Personal history of other malignant neoplasm of skin: Secondary | ICD-10-CM | POA: Diagnosis not present

## 2024-06-10 DIAGNOSIS — L905 Scar conditions and fibrosis of skin: Secondary | ICD-10-CM | POA: Diagnosis not present

## 2024-06-10 DIAGNOSIS — L820 Inflamed seborrheic keratosis: Secondary | ICD-10-CM | POA: Diagnosis not present

## 2024-06-29 DIAGNOSIS — R350 Frequency of micturition: Secondary | ICD-10-CM | POA: Diagnosis not present

## 2024-06-29 DIAGNOSIS — R300 Dysuria: Secondary | ICD-10-CM | POA: Diagnosis not present

## 2024-06-29 DIAGNOSIS — R051 Acute cough: Secondary | ICD-10-CM | POA: Diagnosis not present

## 2024-07-14 ENCOUNTER — Other Ambulatory Visit: Payer: Self-pay | Admitting: Family

## 2024-07-14 DIAGNOSIS — E039 Hypothyroidism, unspecified: Secondary | ICD-10-CM

## 2024-07-14 DIAGNOSIS — G4700 Insomnia, unspecified: Secondary | ICD-10-CM

## 2024-07-18 ENCOUNTER — Other Ambulatory Visit: Payer: Self-pay

## 2024-07-18 DIAGNOSIS — I10 Essential (primary) hypertension: Secondary | ICD-10-CM

## 2024-07-18 MED ORDER — AMLODIPINE BESYLATE 5 MG PO TABS
ORAL_TABLET | ORAL | 1 refills | Status: AC
Start: 2024-07-18 — End: ?

## 2024-10-11 ENCOUNTER — Ambulatory Visit: Admitting: Family

## 2024-10-11 VITALS — BP 133/55 | HR 79 | Temp 98.2°F | Resp 16 | Ht 64.0 in | Wt 179.8 lb

## 2024-10-11 DIAGNOSIS — E039 Hypothyroidism, unspecified: Secondary | ICD-10-CM

## 2024-10-11 DIAGNOSIS — I10 Essential (primary) hypertension: Secondary | ICD-10-CM

## 2024-10-11 LAB — COMPREHENSIVE METABOLIC PANEL WITH GFR
ALT: 10 U/L (ref 3–35)
AST: 14 U/L (ref 5–37)
Albumin: 4.2 g/dL (ref 3.5–5.2)
Alkaline Phosphatase: 50 U/L (ref 39–117)
BUN: 23 mg/dL (ref 6–23)
CO2: 32 meq/L (ref 19–32)
Calcium: 9 mg/dL (ref 8.4–10.5)
Chloride: 106 meq/L (ref 96–112)
Creatinine, Ser: 0.72 mg/dL (ref 0.40–1.20)
GFR: 79.2 mL/min
Glucose, Bld: 97 mg/dL (ref 70–99)
Potassium: 4.2 meq/L (ref 3.5–5.1)
Sodium: 144 meq/L (ref 135–145)
Total Bilirubin: 0.4 mg/dL (ref 0.2–1.2)
Total Protein: 5.9 g/dL — ABNORMAL LOW (ref 6.0–8.3)

## 2024-10-11 LAB — TSH: TSH: 2.65 m[IU]/L (ref 0.40–4.50)

## 2024-10-11 NOTE — Assessment & Plan Note (Signed)
BP stable on amlodipine, continue same.  

## 2024-10-11 NOTE — Assessment & Plan Note (Signed)
 Lab Results  Component Value Date   TSH 2.29 12/04/2023   Stable on synthroid , update TSH.

## 2024-10-11 NOTE — Progress Notes (Signed)
" ° °  Established Patient Office Visit  Subjective   Patient ID: Rebekah Valentine, female    DOB: 02/09/1946  Age: 79 y.o. MRN: [id number]  Chief Complaint  Patient presents with   Hypertension    Here for follow up   Hypothyroidism    Here for follow up    HPI Patient presents in office today for blood pressure and thyroid  follow up visit. Patient reports compliance with current medication regime. BP at goal today. Patient reports taking her blood pressure at home 10x in the last 6 months and all have been at goal <140/90. Denies any other complaints today.   Review of Systems  Constitutional: Negative.   Respiratory: Negative.    Cardiovascular: Negative.      Objective:     BP (!) 133/55 (BP Location: Right Arm, Patient Position: Sitting, Cuff Size: Normal)   Pulse 79   Temp 98.2 F (36.8 C) (Oral)   Resp 16   Ht 5' 4 (1.626 m)   Wt 179 lb 12.8 oz (81.6 kg)   SpO2 100%   BMI 30.86 kg/m  BP Readings from Last 3 Encounters:  10/11/24 (!) 133/55  04/11/24 136/62  12/04/23 (!) 146/63   Physical Exam Constitutional:      Appearance: Normal appearance.  HENT:     Head: Normocephalic.  Cardiovascular:     Rate and Rhythm: Normal rate and regular rhythm.     Heart sounds: Normal heart sounds.  Pulmonary:     Effort: Pulmonary effort is normal.     Breath sounds: Normal breath sounds.  Neurological:     Mental Status: She is alert and oriented to person, place, and time.  Psychiatric:        Mood and Affect: Mood normal.        Behavior: Behavior normal.      Assessment & Plan:  Hypertension -continue amlodipine  -Cmet  Hypothyroidism  -TSH  Preventative care -offered flu shot, patient declined  Follow up in 6 months   Levon Budd, FNP student   "

## 2024-10-11 NOTE — Progress Notes (Signed)
 "  Subjective:     Patient ID: Rebekah Valentine, female    DOB: 1946/04/28, 79 y.o.   MRN: [id number]  Chief Complaint  Patient presents with   Hypertension    Here for follow up   Hypothyroidism    Here for follow up    HPI  Discussed the use of AI scribe software for clinical note transcription with the patient, who gave verbal consent to proceed.  History of Present Illness  Patient is an 79 yr old female who presents today for follow up.  HTN- maintained on amlodipine  5mg .  Hypothyroid- feeling well on synthroid .      Health Maintenance Due  Topic Date Due   Medicare Annual Wellness (AWV)  05/03/2023   COVID-19 Vaccine (4 - 2025-26 season) 05/20/2024    Past Medical History:  Diagnosis Date   Arthritis    Breast cyst    recurrent   COLONIC POLYPS, HX OF 11/18/2007   GERD (gastroesophageal reflux disease)    HEMORRHOIDS    HYPERTENSION    Hypothyroidism    INSOMNIA    Knee pain    Arthritis    SKIN CANCER, HX OF    basal cell carcinoma of the nose   TROCHANTERIC BURSITIS, RIGHT     Past Surgical History:  Procedure Laterality Date   BASAL CELL CARCINOMA EXCISION     face   colon polpectomy  11/2007   Dr. Aneita   COLONOSCOPY     HYSTEROSCOPY     polyp   Lumpectomy Right 1990   breast   POLYPECTOMY     TONSILLECTOMY  1957   TOTAL KNEE ARTHROPLASTY Right 02/22/2016   Procedure: RIGHT TOTAL KNEE ARTHROPLASTY;  Surgeon: Marcey Raman, MD;  Location: [facility] OR;  Service: Orthopedics;  Laterality: Right;    Family History  Problem Relation Age of Onset   COPD Mother        smoker   Arthritis Father    Coronary artery disease Father    Hypertension Father    Heart disease Father    COPD Sister        smoker   Blindness Sister        since birth   Congestive Heart Failure Sister    Colon cancer Sister 62   Parkinson's disease Sister    Diabetes Paternal Grandmother    Breast cancer Paternal Aunt    Colon polyps Neg Hx    Esophageal cancer Neg Hx     Rectal cancer Neg Hx    Stomach cancer Neg Hx     Social History   Socioeconomic History   Marital status: Widowed    Spouse name: Not on file   Number of children: Not on file   Years of education: Not on file   Highest education level: Not on file  Occupational History   Not on file  Tobacco Use   Smoking status: Never   Smokeless tobacco: Never   Tobacco comments:    Married, lives with spouse. Retired asst. 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   Reads/walks dog   Social Drivers of Health   Tobacco Use: Low Risk (04/11/2024)   Patient History    Smoking Tobacco Use: Never    Smokeless Tobacco Use: Never    Passive Exposure: Not on file  Financial Resource Strain: Low Risk (05/02/2022)   Overall Financial Resource Strain (CARDIA)    Difficulty of Paying Living Expenses: Not hard at all  Food Insecurity: No Food Insecurity (05/02/2022)   Hunger Vital Sign    Worried About Running Out of Food in the Last Year: Never true    Ran Out of Food in the Last Year: Never true  Transportation Needs: No Transportation Needs (05/02/2022)   PRAPARE - Administrator, Civil Service (Medical): No    Lack of Transportation (Non-Medical): No  Physical Activity: Inactive (05/02/2022)   Exercise Vital Sign    Days of Exercise per Week: 0 days    Minutes of Exercise per Session: 0 min  Stress: No Stress Concern Present (05/02/2022)   [employer] of Occupational Health - Occupational Stress Questionnaire    Feeling of Stress : Not at all  Social Connections:  Moderately Integrated (05/02/2022)   Social Connection and Isolation Panel    Frequency of Communication with Friends and Family: More than three times a week    Frequency of Social Gatherings with Friends and Family: More than three times a week    Attends Religious Services: More than 4 times per year    Active Member of [employer] or Organizations: Yes    Attends Banker Meetings: More than 4 times per year    Marital Status: Widowed  Intimate Partner Violence: Not At Risk (05/02/2022)   Humiliation, Afraid, Rape, and Kick questionnaire    Fear of Current or Ex-Partner: No    Emotionally Abused: No    Physically Abused: No    Sexually Abused: No  Depression (PHQ2-9): Low Risk (10/11/2024)   Depression (PHQ2-9)    PHQ-2 Score: 0  Alcohol Screen: Low Risk (05/02/2022)   Alcohol Screen    Last Alcohol Screening Score (AUDIT): 0  Housing: Not on file  Utilities: Not on file  Health Literacy: Not on file    Outpatient Medications Prior to Visit  Medication Sig Dispense Refill   amitriptyline  (ELAVIL ) 25 MG tablet TAKE 1 TABLET(25 MG) BY MOUTH AT BEDTIME 90 tablet 1   amLODipine  (NORVASC ) 5 MG tablet TAKE 1 TABLET(5 MG) BY MOUTH DAILY 90 tablet 1   aspirin EC 81 MG tablet Take 81 mg by mouth 3 (three) times a week.      Calcium Carbonate-Vit D-Min (CALCIUM 1200 PO) Take 1 tablet by mouth every morning. 600mg  calcium and 500mg  vitamin D each tablet     Cholecalciferol (VITAMIN D3) 2000 UNITS TABS Take 2,000 Units by mouth daily.      levothyroxine  (SYNTHROID ) 50 MCG tablet TAKE 1 TABLET(50 MCG) BY MOUTH DAILY BEFORE BREAKFAST 90 tablet 1   Multiple Vitamins-Minerals (ONE-A-DAY EXTRAS ANTIOXIDANT) CAPS Take 1 capsule by mouth daily.     omeprazole  (PRILOSEC ) 20 MG capsule TAKE 1 CAPSULE(20 MG) BY MOUTH DAILY 90 capsule 3   Polyethyl Glycol-Propyl Glycol (SYSTANE) 0.4-0.3 % SOLN Apply to eye.     Turmeric (QC TUMERIC COMPLEX PO) Take by mouth.     cycloSPORINE  (RESTASIS ) 0.05 %  ophthalmic emulsion Place 1 drop into both eyes every 12 (twelve) hours.  Facility-Administered Medications Prior to Visit  Medication Dose Route Frequency Provider Last Rate Last Admin   0.9 %  sodium chloride  infusion  500 mL Intravenous Once Aneita Gwendlyn DASEN, MD        Allergies[1]  ROS See HPI    Objective:    Physical Exam Constitutional:      General: She is not in acute distress.    Appearance: Normal appearance. She is well-developed.  HENT:     Head: Normocephalic and atraumatic.     Right Ear: External ear normal.     Left Ear: External ear normal.  Eyes:     General: No scleral icterus. Neck:     Thyroid : No thyromegaly.  Cardiovascular:     Rate and Rhythm: Normal rate and regular rhythm.     Heart sounds: Normal heart sounds. No murmur heard. Pulmonary:     Effort: Pulmonary effort is normal. No respiratory distress.     Breath sounds: Normal breath sounds. No wheezing.  Musculoskeletal:     Cervical back: Neck supple.  Skin:    General: Skin is warm and dry.  Neurological:     Mental Status: She is alert and oriented to person, place, and time.  Psychiatric:        Mood and Affect: Mood normal.        Behavior: Behavior normal.        Thought Content: Thought content normal.        Judgment: Judgment normal.      BP (!) 133/55 (BP Location: Right Arm, Patient Position: Sitting, Cuff Size: Normal)   Pulse 79   Temp 98.2 F (36.8 C) (Oral)   Resp 16   Ht 5' 4 (1.626 m)   Wt 179 lb 12.8 oz (81.6 kg)   SpO2 100%   BMI 30.86 kg/m  Wt Readings from Last 3 Encounters:  10/11/24 179 lb 12.8 oz (81.6 kg)  04/11/24 177 lb (80.3 kg)  12/04/23 180 lb (81.6 kg)       Assessment & Plan:   Problem List Items Addressed This Visit       Unprioritized   Hypothyroidism   Lab Results  Component Value Date   TSH 2.29 12/04/2023   Stable on synthroid , update TSH.        Relevant Orders   TSH   HTN (hypertension) - Primary   BP stable on  amlodipine , continue same.      Relevant Orders   Comp Met (CMET)    I have discontinued Juliahna J. Akhter's cycloSPORINE . I am also having her maintain her Calcium Carbonate-Vit D-Min (CALCIUM 1200 PO), One-A-Day Extras Antioxidant, Vitamin D3, aspirin EC, Turmeric (QC TUMERIC COMPLEX PO), omeprazole , levothyroxine , amitriptyline , amLODipine , and Systane. We will continue to administer sodium chloride .  No orders of the defined types were placed in this encounter.     [1] No Known Allergies  "

## 2024-10-11 NOTE — Patient Instructions (Signed)
 Please complete lab work prior to leaving.

## 2024-10-14 ENCOUNTER — Ambulatory Visit: Payer: Self-pay | Admitting: Family

## 2024-10-15 ENCOUNTER — Other Ambulatory Visit: Payer: Self-pay

## 2024-10-15 MED ORDER — OMEPRAZOLE 20 MG PO CPDR: 20.0000 mg | DELAYED_RELEASE_CAPSULE | Freq: Every day | ORAL | 3 refills | Status: AC

## 2025-04-11 ENCOUNTER — Ambulatory Visit: Admitting: Family
